# Patient Record
Sex: Male | Born: 1955 | Race: White | Hispanic: No | Marital: Married | Smoking: Former smoker
Health system: Southern US, Community
[De-identification: ages and names within clinical notes are randomized; demographics above are authoritative.]

## PROBLEM LIST (undated history)

## (undated) DIAGNOSIS — E7800 Pure hypercholesterolemia, unspecified: Secondary | ICD-10-CM

## (undated) DIAGNOSIS — E119 Type 2 diabetes mellitus without complications: Secondary | ICD-10-CM

## (undated) DIAGNOSIS — G4733 Obstructive sleep apnea (adult) (pediatric): Secondary | ICD-10-CM

## (undated) DIAGNOSIS — F419 Anxiety disorder, unspecified: Secondary | ICD-10-CM

## (undated) DIAGNOSIS — F32A Depression, unspecified: Secondary | ICD-10-CM

## (undated) DIAGNOSIS — G43909 Migraine, unspecified, not intractable, without status migrainosus: Secondary | ICD-10-CM

## (undated) DIAGNOSIS — T148XXA Other injury of unspecified body region, initial encounter: Secondary | ICD-10-CM

## (undated) DIAGNOSIS — I10 Essential (primary) hypertension: Secondary | ICD-10-CM

## (undated) DIAGNOSIS — F329 Major depressive disorder, single episode, unspecified: Secondary | ICD-10-CM

## (undated) HISTORY — DX: Depression, unspecified: F32.A

## (undated) HISTORY — DX: Anxiety disorder, unspecified: F41.9

## (undated) HISTORY — DX: Essential (primary) hypertension: I10

## (undated) HISTORY — DX: Obstructive sleep apnea (adult) (pediatric): G47.33

## (undated) HISTORY — DX: Pure hypercholesterolemia, unspecified: E78.00

## (undated) HISTORY — DX: Major depressive disorder, single episode, unspecified: F32.9

## (undated) HISTORY — PX: CHOLECYSTECTOMY: SHX55

## (undated) HISTORY — DX: Migraine, unspecified, not intractable, without status migrainosus: G43.909

## (undated) HISTORY — DX: Type 2 diabetes mellitus without complications: E11.9

## (undated) HISTORY — DX: Other injury of unspecified body region, initial encounter: T14.8XXA

---
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
## 1983-07-18 HISTORY — PX: BACK SURGERY: SHX140

## 1996-07-17 DIAGNOSIS — T148XXA Other injury of unspecified body region, initial encounter: Secondary | ICD-10-CM

## 1996-07-17 HISTORY — DX: Other injury of unspecified body region, initial encounter: T14.8XXA

## 1999-07-18 HISTORY — PX: OTHER SURGICAL HISTORY: SHX169

## 2008-02-27 ENCOUNTER — Encounter: Admission: RE | Admit: 2008-02-27 | Discharge: 2008-02-27 | Payer: Self-pay | Admitting: Endocrinology

## 2008-02-27 IMAGING — CT CT HEAD W/O CM
1 series · 16 of 28 positions shown, 20 images · non-contrast
Comparison: None

CLINICAL DATA: Assess for pituitary tumor

CT HEAD WITHOUT CONTRAST
TECHNIQUE: Contiguous axial images were obtained from the base of
the skull through the vertex without contrast.

[Series 2: head · axial · 0.49mm/px · z∈[-0,+127]mm · 16 of 28 slices shown, 20 images]
[im 2/28  brain]
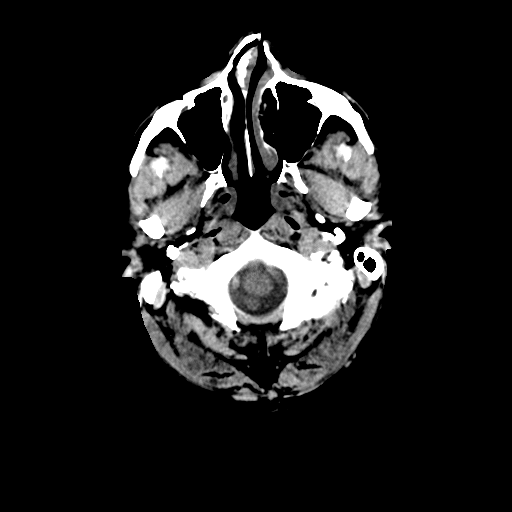
[im 2/28  bone]
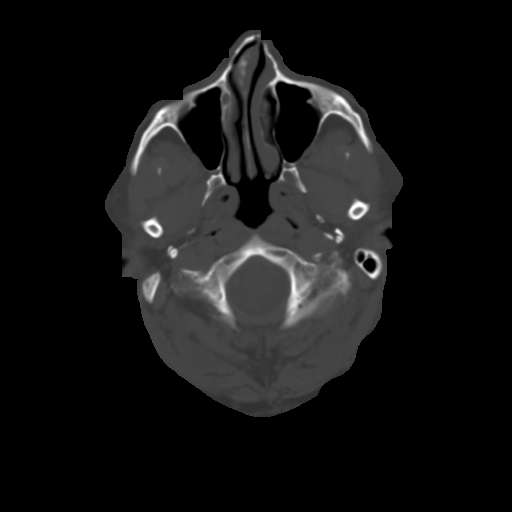
[im 4/28  brain]
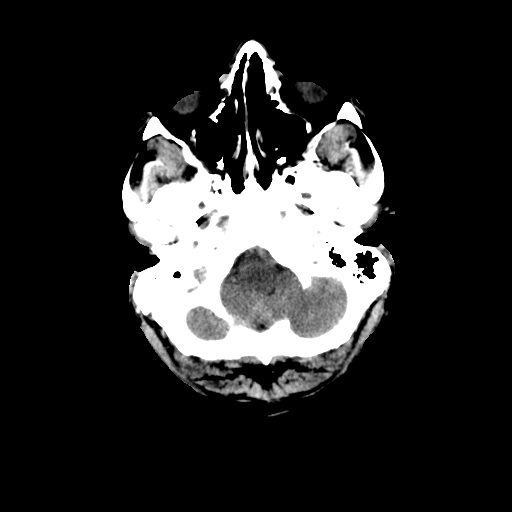
[im 6/28  brain]
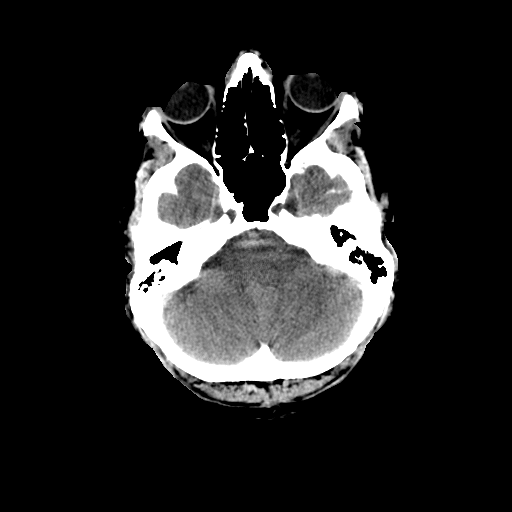
[im 7/28  brain]
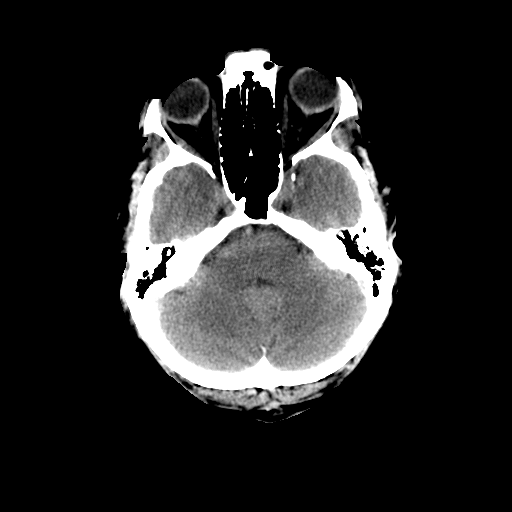
[im 9/28  brain]
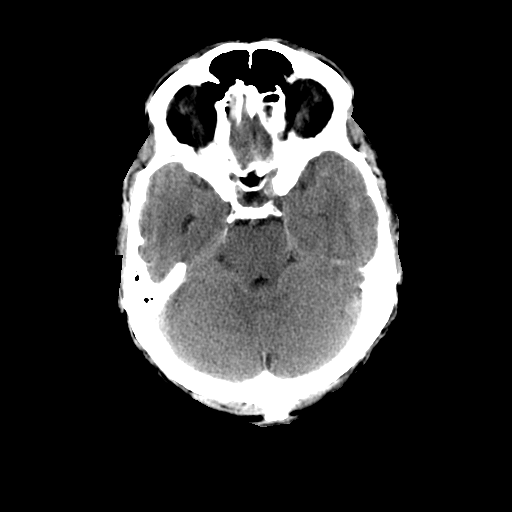
[im 9/28  bone]
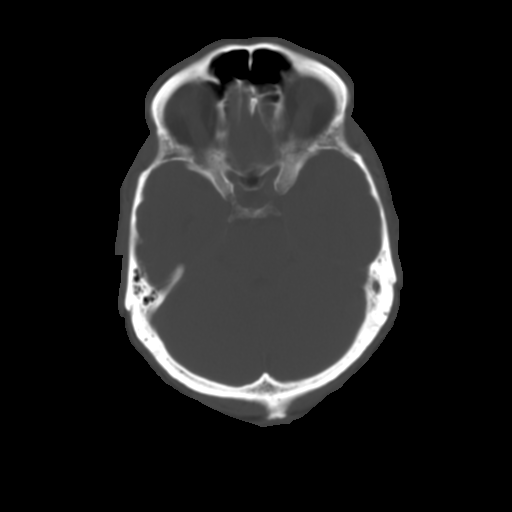
[im 10/28  brain]
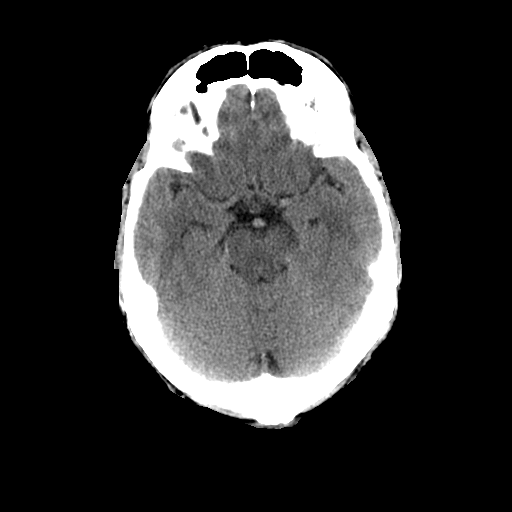
[im 12/28  brain]
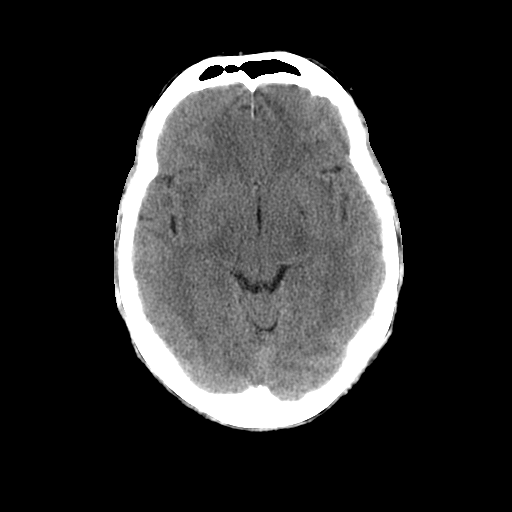
[im 14/28  brain]
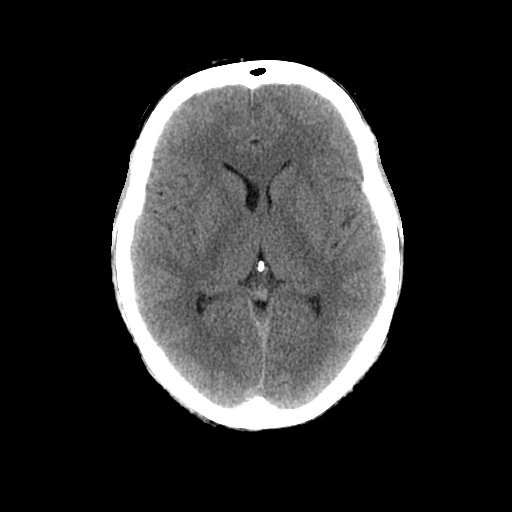
[im 15/28  brain]
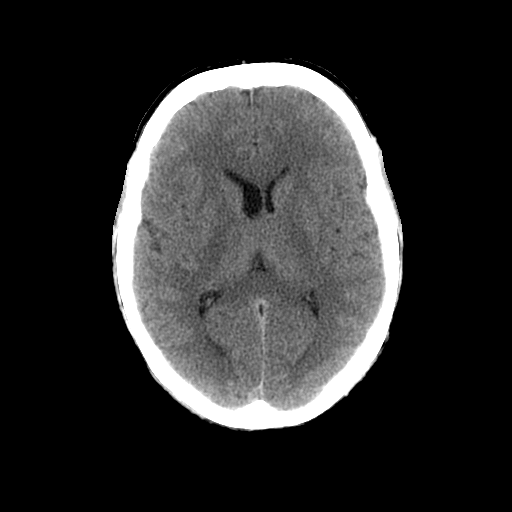
[im 15/28  bone]
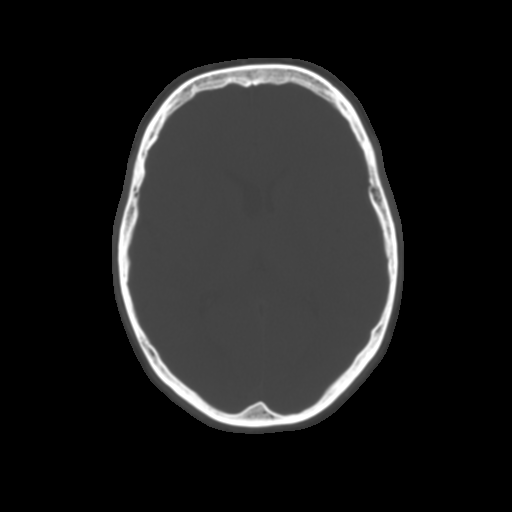
[im 17/28  brain]
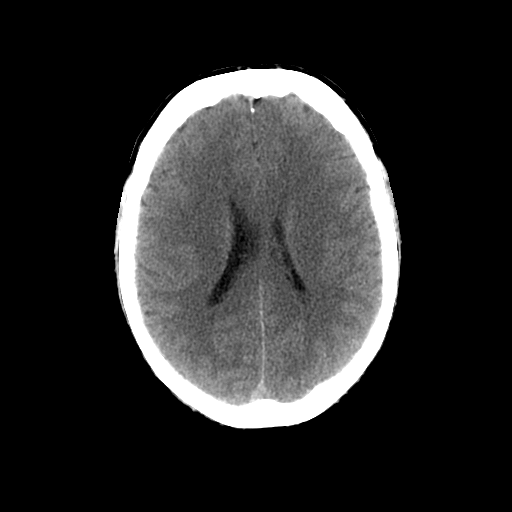
[im 19/28  brain]
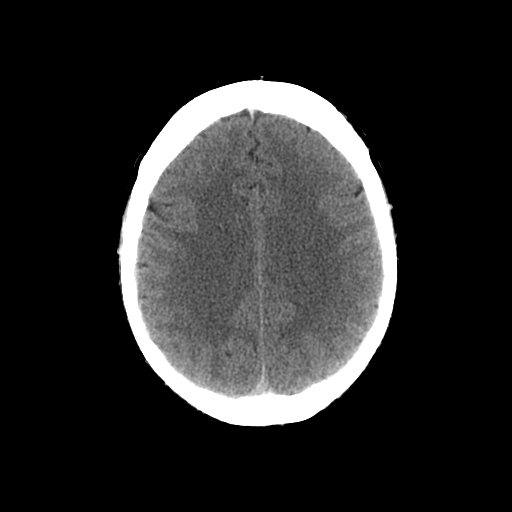
[im 20/28  brain]
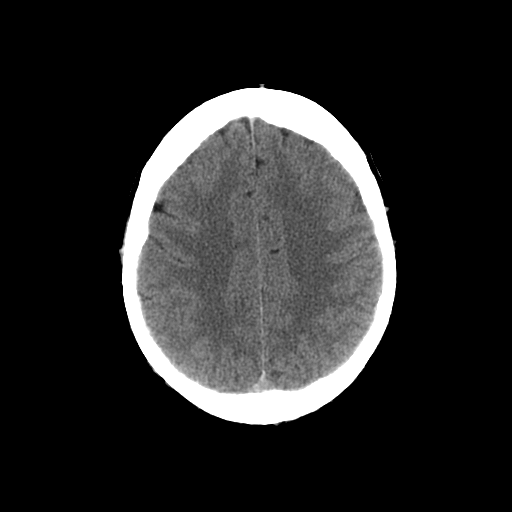
[im 22/28  brain]
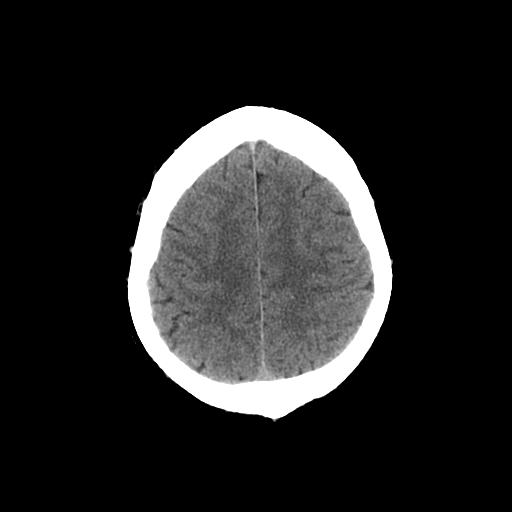
[im 22/28  bone]
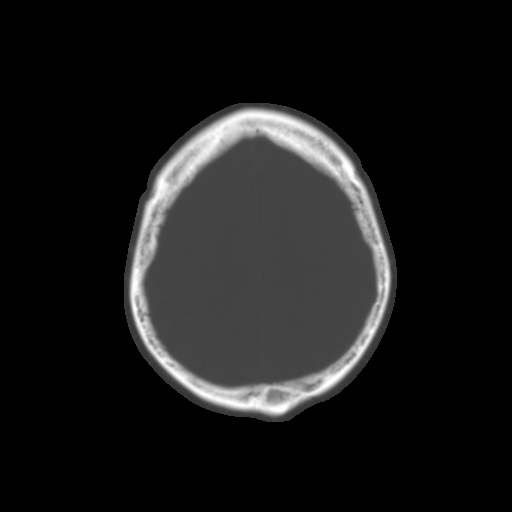
[im 23/28  brain]
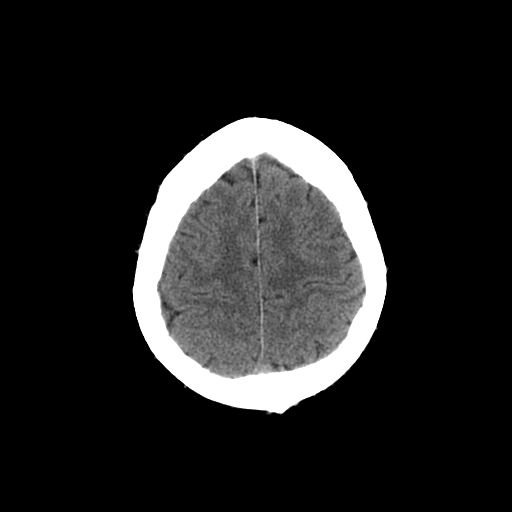
[im 25/28  brain]
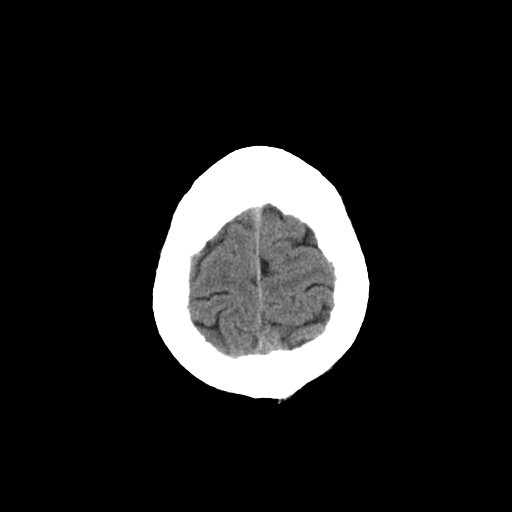
[im 27/28  brain]
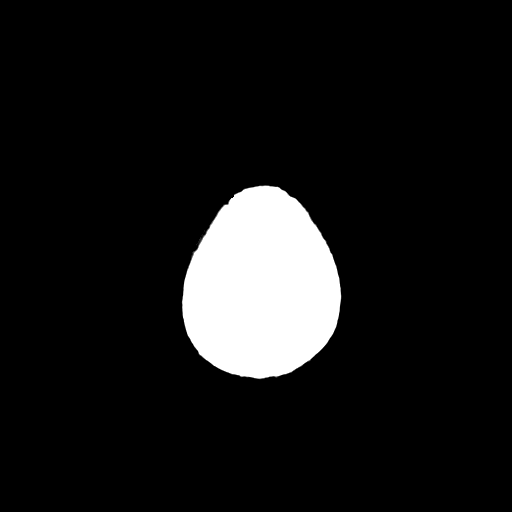

[16 of 28 positions shown; findings below may reference images not displayed]

FINDINGS: The brain has a normal appearance without evidence of
atrophy, infarction, mass lesion, hemorrhage, hydrocephalus or
extra-axial collection.  There is no pituitary mass lesion evident.
Obviously, microadenoma could not be excluded, but there is no
evidence of macroadenoma or anything in the suprasellar region.
Sinuses, middle ears and mastoids are clear.
IMPRESSION: Normal examination.  See above.

## 2009-07-17 HISTORY — PX: OTHER SURGICAL HISTORY: SHX169

## 2010-12-15 DIAGNOSIS — R072 Precordial pain: Secondary | ICD-10-CM

## 2011-04-03 DIAGNOSIS — M19019 Primary osteoarthritis, unspecified shoulder: Secondary | ICD-10-CM | POA: Insufficient documentation

## 2015-08-06 DIAGNOSIS — E119 Type 2 diabetes mellitus without complications: Secondary | ICD-10-CM | POA: Diagnosis not present

## 2015-12-20 DIAGNOSIS — R05 Cough: Secondary | ICD-10-CM | POA: Diagnosis not present

## 2015-12-20 DIAGNOSIS — E1165 Type 2 diabetes mellitus with hyperglycemia: Secondary | ICD-10-CM | POA: Diagnosis not present

## 2015-12-20 DIAGNOSIS — J3089 Other allergic rhinitis: Secondary | ICD-10-CM | POA: Diagnosis not present

## 2016-03-29 DIAGNOSIS — K210 Gastro-esophageal reflux disease with esophagitis: Secondary | ICD-10-CM | POA: Diagnosis not present

## 2016-03-29 DIAGNOSIS — E1165 Type 2 diabetes mellitus with hyperglycemia: Secondary | ICD-10-CM | POA: Diagnosis not present

## 2016-03-29 DIAGNOSIS — R5383 Other fatigue: Secondary | ICD-10-CM | POA: Diagnosis not present

## 2016-03-30 DIAGNOSIS — R5383 Other fatigue: Secondary | ICD-10-CM | POA: Diagnosis not present

## 2016-03-30 DIAGNOSIS — Z6824 Body mass index (BMI) 24.0-24.9, adult: Secondary | ICD-10-CM | POA: Diagnosis not present

## 2016-03-30 DIAGNOSIS — Z23 Encounter for immunization: Secondary | ICD-10-CM | POA: Diagnosis not present

## 2016-03-30 DIAGNOSIS — Z0001 Encounter for general adult medical examination with abnormal findings: Secondary | ICD-10-CM | POA: Diagnosis not present

## 2016-03-30 DIAGNOSIS — R69 Illness, unspecified: Secondary | ICD-10-CM | POA: Diagnosis not present

## 2016-03-30 DIAGNOSIS — E1165 Type 2 diabetes mellitus with hyperglycemia: Secondary | ICD-10-CM | POA: Diagnosis not present

## 2016-03-30 DIAGNOSIS — K219 Gastro-esophageal reflux disease without esophagitis: Secondary | ICD-10-CM | POA: Diagnosis not present

## 2016-09-15 DIAGNOSIS — K210 Gastro-esophageal reflux disease with esophagitis: Secondary | ICD-10-CM | POA: Diagnosis not present

## 2016-09-15 DIAGNOSIS — E1165 Type 2 diabetes mellitus with hyperglycemia: Secondary | ICD-10-CM | POA: Diagnosis not present

## 2016-09-15 DIAGNOSIS — E1140 Type 2 diabetes mellitus with diabetic neuropathy, unspecified: Secondary | ICD-10-CM | POA: Diagnosis not present

## 2016-09-15 DIAGNOSIS — R69 Illness, unspecified: Secondary | ICD-10-CM | POA: Diagnosis not present

## 2016-09-19 DIAGNOSIS — R69 Illness, unspecified: Secondary | ICD-10-CM | POA: Diagnosis not present

## 2016-09-19 DIAGNOSIS — E1165 Type 2 diabetes mellitus with hyperglycemia: Secondary | ICD-10-CM | POA: Diagnosis not present

## 2016-09-19 DIAGNOSIS — K210 Gastro-esophageal reflux disease with esophagitis: Secondary | ICD-10-CM | POA: Diagnosis not present

## 2016-09-19 DIAGNOSIS — E1140 Type 2 diabetes mellitus with diabetic neuropathy, unspecified: Secondary | ICD-10-CM | POA: Diagnosis not present

## 2016-09-28 DIAGNOSIS — Z6825 Body mass index (BMI) 25.0-25.9, adult: Secondary | ICD-10-CM | POA: Diagnosis not present

## 2016-09-28 DIAGNOSIS — L03114 Cellulitis of left upper limb: Secondary | ICD-10-CM | POA: Diagnosis not present

## 2016-10-06 DIAGNOSIS — Z6825 Body mass index (BMI) 25.0-25.9, adult: Secondary | ICD-10-CM | POA: Diagnosis not present

## 2016-10-06 DIAGNOSIS — L03114 Cellulitis of left upper limb: Secondary | ICD-10-CM | POA: Diagnosis not present

## 2017-04-30 DIAGNOSIS — E1165 Type 2 diabetes mellitus with hyperglycemia: Secondary | ICD-10-CM | POA: Diagnosis not present

## 2017-04-30 DIAGNOSIS — K210 Gastro-esophageal reflux disease with esophagitis: Secondary | ICD-10-CM | POA: Diagnosis not present

## 2017-04-30 DIAGNOSIS — E1140 Type 2 diabetes mellitus with diabetic neuropathy, unspecified: Secondary | ICD-10-CM | POA: Diagnosis not present

## 2017-04-30 DIAGNOSIS — R5383 Other fatigue: Secondary | ICD-10-CM | POA: Diagnosis not present

## 2017-05-02 DIAGNOSIS — R5383 Other fatigue: Secondary | ICD-10-CM | POA: Diagnosis not present

## 2017-05-02 DIAGNOSIS — M129 Arthropathy, unspecified: Secondary | ICD-10-CM | POA: Diagnosis not present

## 2017-05-02 DIAGNOSIS — E1140 Type 2 diabetes mellitus with diabetic neuropathy, unspecified: Secondary | ICD-10-CM | POA: Diagnosis not present

## 2017-05-02 DIAGNOSIS — Z6825 Body mass index (BMI) 25.0-25.9, adult: Secondary | ICD-10-CM | POA: Diagnosis not present

## 2017-05-02 DIAGNOSIS — E1165 Type 2 diabetes mellitus with hyperglycemia: Secondary | ICD-10-CM | POA: Diagnosis not present

## 2017-05-02 DIAGNOSIS — K219 Gastro-esophageal reflux disease without esophagitis: Secondary | ICD-10-CM | POA: Diagnosis not present

## 2017-05-02 DIAGNOSIS — Z0001 Encounter for general adult medical examination with abnormal findings: Secondary | ICD-10-CM | POA: Diagnosis not present

## 2017-05-02 DIAGNOSIS — Z23 Encounter for immunization: Secondary | ICD-10-CM | POA: Diagnosis not present

## 2017-05-02 DIAGNOSIS — R69 Illness, unspecified: Secondary | ICD-10-CM | POA: Diagnosis not present

## 2017-08-31 DIAGNOSIS — E1165 Type 2 diabetes mellitus with hyperglycemia: Secondary | ICD-10-CM | POA: Diagnosis not present

## 2017-08-31 DIAGNOSIS — E1140 Type 2 diabetes mellitus with diabetic neuropathy, unspecified: Secondary | ICD-10-CM | POA: Diagnosis not present

## 2017-08-31 DIAGNOSIS — R5383 Other fatigue: Secondary | ICD-10-CM | POA: Diagnosis not present

## 2017-08-31 DIAGNOSIS — K210 Gastro-esophageal reflux disease with esophagitis: Secondary | ICD-10-CM | POA: Diagnosis not present

## 2017-08-31 DIAGNOSIS — F411 Generalized anxiety disorder: Secondary | ICD-10-CM | POA: Diagnosis not present

## 2017-09-05 DIAGNOSIS — Z6825 Body mass index (BMI) 25.0-25.9, adult: Secondary | ICD-10-CM | POA: Diagnosis not present

## 2017-09-05 DIAGNOSIS — R5383 Other fatigue: Secondary | ICD-10-CM | POA: Diagnosis not present

## 2017-09-05 DIAGNOSIS — R5381 Other malaise: Secondary | ICD-10-CM | POA: Diagnosis not present

## 2017-10-04 DIAGNOSIS — Z6825 Body mass index (BMI) 25.0-25.9, adult: Secondary | ICD-10-CM | POA: Diagnosis not present

## 2017-10-04 DIAGNOSIS — S8002XA Contusion of left knee, initial encounter: Secondary | ICD-10-CM | POA: Diagnosis not present

## 2017-12-18 DIAGNOSIS — Z6824 Body mass index (BMI) 24.0-24.9, adult: Secondary | ICD-10-CM | POA: Diagnosis not present

## 2017-12-18 DIAGNOSIS — R300 Dysuria: Secondary | ICD-10-CM | POA: Diagnosis not present

## 2017-12-20 DIAGNOSIS — F324 Major depressive disorder, single episode, in partial remission: Secondary | ICD-10-CM | POA: Diagnosis not present

## 2017-12-27 DIAGNOSIS — F324 Major depressive disorder, single episode, in partial remission: Secondary | ICD-10-CM | POA: Diagnosis not present

## 2018-03-21 DIAGNOSIS — H52222 Regular astigmatism, left eye: Secondary | ICD-10-CM | POA: Diagnosis not present

## 2018-03-21 DIAGNOSIS — H5203 Hypermetropia, bilateral: Secondary | ICD-10-CM | POA: Diagnosis not present

## 2018-03-21 DIAGNOSIS — H25813 Combined forms of age-related cataract, bilateral: Secondary | ICD-10-CM | POA: Diagnosis not present

## 2018-03-21 DIAGNOSIS — E119 Type 2 diabetes mellitus without complications: Secondary | ICD-10-CM | POA: Diagnosis not present

## 2018-03-21 DIAGNOSIS — H524 Presbyopia: Secondary | ICD-10-CM | POA: Diagnosis not present

## 2018-04-01 DIAGNOSIS — J3489 Other specified disorders of nose and nasal sinuses: Secondary | ICD-10-CM | POA: Diagnosis not present

## 2018-04-01 DIAGNOSIS — R05 Cough: Secondary | ICD-10-CM | POA: Diagnosis not present

## 2018-04-30 DIAGNOSIS — F324 Major depressive disorder, single episode, in partial remission: Secondary | ICD-10-CM | POA: Diagnosis not present

## 2018-07-17 HISTORY — PX: OTHER SURGICAL HISTORY: SHX169

## 2018-08-21 DIAGNOSIS — Z6824 Body mass index (BMI) 24.0-24.9, adult: Secondary | ICD-10-CM | POA: Diagnosis not present

## 2018-08-21 DIAGNOSIS — R51 Headache: Secondary | ICD-10-CM | POA: Diagnosis not present

## 2018-09-25 DIAGNOSIS — E1140 Type 2 diabetes mellitus with diabetic neuropathy, unspecified: Secondary | ICD-10-CM | POA: Diagnosis not present

## 2018-09-25 DIAGNOSIS — R51 Headache: Secondary | ICD-10-CM | POA: Diagnosis not present

## 2018-09-25 DIAGNOSIS — E1165 Type 2 diabetes mellitus with hyperglycemia: Secondary | ICD-10-CM | POA: Diagnosis not present

## 2018-09-25 DIAGNOSIS — Z6825 Body mass index (BMI) 25.0-25.9, adult: Secondary | ICD-10-CM | POA: Diagnosis not present

## 2018-09-25 DIAGNOSIS — N400 Enlarged prostate without lower urinary tract symptoms: Secondary | ICD-10-CM | POA: Diagnosis not present

## 2018-11-05 DIAGNOSIS — Z0001 Encounter for general adult medical examination with abnormal findings: Secondary | ICD-10-CM | POA: Diagnosis not present

## 2018-11-05 DIAGNOSIS — E1165 Type 2 diabetes mellitus with hyperglycemia: Secondary | ICD-10-CM | POA: Diagnosis not present

## 2018-11-05 DIAGNOSIS — E1140 Type 2 diabetes mellitus with diabetic neuropathy, unspecified: Secondary | ICD-10-CM | POA: Diagnosis not present

## 2018-11-05 DIAGNOSIS — M542 Cervicalgia: Secondary | ICD-10-CM | POA: Diagnosis not present

## 2018-11-05 DIAGNOSIS — F5101 Primary insomnia: Secondary | ICD-10-CM | POA: Diagnosis not present

## 2018-11-05 DIAGNOSIS — R51 Headache: Secondary | ICD-10-CM | POA: Diagnosis not present

## 2018-11-05 DIAGNOSIS — N400 Enlarged prostate without lower urinary tract symptoms: Secondary | ICD-10-CM | POA: Diagnosis not present

## 2018-11-05 DIAGNOSIS — Z6824 Body mass index (BMI) 24.0-24.9, adult: Secondary | ICD-10-CM | POA: Diagnosis not present

## 2018-11-19 ENCOUNTER — Telehealth: Payer: Self-pay | Admitting: Neurology

## 2018-11-19 NOTE — Telephone Encounter (Signed)
Patient is a new patient so we will not be able to do a telephone visit for this appointment.

## 2018-11-19 NOTE — Telephone Encounter (Addendum)
I called the wife back and LVM asking for call back. I also tried the home # and was able to speak to the patient. He stated his wife has access to a laptop or computer with camera and gave verbal consent to do a video visit . He is aware that there will be some limitations to the physical exam. He provided consent to file the visit with insurance. He stated he would call his wife discuss and I advised that our office will close at 5 pm so if we are not able to get the video visit setup before then we will have to r/s to another time. Appt is pending for tomorrow AM @ 8:00. He verbalized understanding.   Update: spoke with wife. She stated daughter can get the laptop to them for the visit. Daughter's email is [email or web address]. His appt was r/s to 3;30 pm 5/6 to allow for time to setup the visit. She stated pt has had a h/a since 08-20-2018. His mother died of brain cancer in her mid 22s. Pt's wife aware that the link will take her straight to a secure website called [email or web address] where the patient will enter a virtual waiting room. She verbalized understanding and appreciation.

## 2018-11-19 NOTE — Telephone Encounter (Signed)
Pt called stating that her husband only has a flip phone and are not able to do a Virtual Visit. Pts wife states that if worse comes to worse they can call their daughter to see if they can use her laptop. Please advise.

## 2018-11-19 NOTE — Telephone Encounter (Signed)
Email with Dr. Trevor Mace [email or web address] link sent to [email or web address].

## 2018-11-20 ENCOUNTER — Telehealth: Payer: Self-pay | Admitting: Neurology

## 2018-11-20 ENCOUNTER — Ambulatory Visit: Payer: Self-pay | Admitting: Neurology

## 2018-11-20 ENCOUNTER — Ambulatory Visit: Payer: Medicare PPO | Admitting: Neurology

## 2018-11-20 ENCOUNTER — Encounter: Payer: Self-pay | Admitting: *Deleted

## 2018-11-20 NOTE — Telephone Encounter (Signed)
Called wife and left another message. Would like to know if they prefer to be seen in the office or sooner than next Thursday. Left 2 messages, asked her to call us back. Or we can just keep the video appointment next Thursday. thanks

## 2018-11-20 NOTE — Telephone Encounter (Signed)
Pt's wife Kyle [name] called me back. I expressed Dr. Trevor Mace apologies for r/s and asked if she would like to either keep the video visit or we can see him in the office. We have an opening tomorrow morning. She needed to schedule around work because she will bringing pt. She will keep his same appt scheduled for 5/14 @ 11:15 and it will be an in-office visit. I advised her of the process of arrival (drive-up for check-in, wait in the car, temperature taken, when RN ready will bring inside). Advised no visitors with pt in office unless necessary. Pt's wife denied pt having any fever, sob, cough, no known exposure to anyone covid19 positive or being tested for it. She is aware pt must wear a mask. Pt's wife verbalized appreciation.

## 2018-11-20 NOTE — Telephone Encounter (Signed)
I called patient to see if he could do an earlier appointment today and he said he wanted his wife to be there so he could not. I offered to reschedule sooner. He was lovely, he was fine. He gave me the number for his wife to call and schedule, I left a message for his wife.  I will call patient's wife later this afternoon again and see if we can get them in for video tomorrow of Friday or if warranted we can offer an in office appointment. Will discuss with wife.

## 2018-11-28 ENCOUNTER — Other Ambulatory Visit: Payer: Self-pay

## 2018-11-28 ENCOUNTER — Encounter: Payer: Self-pay | Admitting: Neurology

## 2018-11-28 ENCOUNTER — Encounter: Payer: Self-pay | Admitting: *Deleted

## 2018-11-28 ENCOUNTER — Ambulatory Visit (INDEPENDENT_AMBULATORY_CARE_PROVIDER_SITE_OTHER): Payer: Medicare PPO | Admitting: Neurology

## 2018-11-28 VITALS — BP 156/86 | HR 62 | Temp 97.5°F | Ht 74.0 in | Wt 189.0 lb

## 2018-11-28 DIAGNOSIS — G4453 Primary thunderclap headache: Secondary | ICD-10-CM

## 2018-11-28 DIAGNOSIS — H5713 Ocular pain, bilateral: Secondary | ICD-10-CM | POA: Diagnosis not present

## 2018-11-28 DIAGNOSIS — R51 Headache with orthostatic component, not elsewhere classified: Secondary | ICD-10-CM

## 2018-11-28 DIAGNOSIS — R519 Headache, unspecified: Secondary | ICD-10-CM

## 2018-11-28 DIAGNOSIS — G441 Vascular headache, not elsewhere classified: Secondary | ICD-10-CM

## 2018-11-28 DIAGNOSIS — G4452 New daily persistent headache (NDPH): Secondary | ICD-10-CM | POA: Diagnosis not present

## 2018-11-28 MED ORDER — GABAPENTIN 300 MG PO CAPS: 600.0000 mg | ORAL_CAPSULE | Freq: Three times a day (TID) | ORAL | 11 refills | Status: DC

## 2018-11-28 NOTE — Progress Notes (Signed)
Kyle [name]    Provider:  Dr Lucia Gaskins Requesting Provider: Estanislado Pandy, MD Primary Care Provider:  Estanislado Pandy, MD  CC:  New daily persistent headache  HPI:  Kyle [name] is a 63 y.o. male here as requested by Estanislado Pandy, MD for headache. He has a PMHx of chronic pain, s/p spinal stimulator, anxiety, remote migraines, neck injury and chronic right arm weakness, HTN, HLD, DM, depression. Headaches since December acute onset, severe worst headache of his life. He has ESI injections but since the radioablation he has had a headache but did not coincide with the injection. Kyle KitchenHeadaches started a month later. Headaches in January. No known inciting events. He woke up one morning and felt like somoene hit him with a hammer right in the middle behind the eyes. It is tender to touch. Continuous, never stops, it wakes him up at night, he wakes with it, worse with position and valsalva. No snoring, he has a spinal stimulator. He woke up with it and it never went away. He is in a lot of pain. Constant, continuous 8/10 pain. Very shrill sounds make it worse. Nothing makes it better. Not positional. It throbs. No light or sound sensitivity (except for above), no nausea, no vomiting, sensitive to the touch at the bridge of the nose and radiates to the eyebrows into the temples. He feels more dizzy lately. He feels unbalanced even when sitting. No congestion no fever. Symmetric to the temples. Many years ago he had migraines, chocolate and a medication maybe triggered it and it was 1990 and nothing like this. Did not help him at all. No other focal neurologic deficits, associated symptoms, inciting events or modifiable factors.   Review of Systems: Patient complains of symptoms per HPI as well as the following symptoms: headache. Pertinent negatives and positives per HPI. All others negative.   Social History   Socioeconomic History  . Marital status: Married    Spouse name:  Drayson Latimer  . Number of children: 2  . Years of education: 31  . Highest education level: Not on file  Occupational History  . Occupation: disabled  Social Needs  . Financial resource strain: Not on file  . Food insecurity:    Worry: Not on file    Inability: Not on file  . Transportation needs:    Medical: Not on file    Non-medical: Not on file  Tobacco Use  . Smoking status: Former Smoker    Last attempt to quit: 1987    Years since quitting: 33.3  . Smokeless tobacco: Never Used  . Tobacco comment: 3-4 per day  Substance and Sexual Activity  . Alcohol use: Yes    Comment: occasionally  . Drug use: Never  . Sexual activity: Not on file  Lifestyle  . Physical activity:    Days per week: Not on file    Minutes per session: Not on file  . Stress: Not on file  Relationships  . Social connections:    Talks on phone: Not on file    Gets together: Not on file    Attends religious service: Not on file    Active member of club or organization: Not on file    Attends meetings of clubs or organizations: Not on file    Relationship status: Not on file  . Intimate partner violence:    Fear of current or ex partner: Not on file    Emotionally abused: Not on file  Physically abused: Not on file    Forced sexual activity: Not on file  Other Topics Concern  . Not on file  Social History Narrative   Lives at home with his wife   Right handed    Family History  Problem Relation Age of Onset  . Brain cancer Mother   . Heart disease Father   . Heart attack Father   . Diabetes Maternal Grandfather   . Cancer Paternal Grandmother     Past Medical History:  Diagnosis Date  . Anxiety   . Depression   . Diabetes (HCC)   . High cholesterol   . Hypertension   . Migraine   . 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 [facility]    Patient  Active Problem List   Diagnosis Date Noted  . NDPH (new daily persistent headache) 11/28/2018    Past Surgical History:  Procedure Laterality Date  . BACK SURGERY  1985  . CHOLECYSTECTOMY     2010?  Kyle [name]. FEMUR SURGERY  1972  . spinal stimulator implanted  2001  . spinal stimulator replaced  2011    Current Outpatient Medications  Medication Sig Dispense Refill  . buPROPion (WELLBUTRIN XL) 150 MG 24 hr tablet Take 150 mg by mouth 3 (three) times daily.    . DULoxetine (CYMBALTA) 20 MG capsule Take 20 mg by mouth daily.    Kyle [name]. gabapentin (NEURONTIN) 300 MG capsule Take 2 capsules (600 mg total) by mouth 3 (three) times daily. 180 capsule 11  . glipiZIDE (GLUCOTROL XL) 10 MG 24 hr tablet Take 10 mg by mouth 2 (two) times daily.    Kyle [name]. oxyCODONE-acetaminophen (PERCOCET/ROXICET) 5-325 MG tablet Take by mouth 3 (three) times daily.    Kyle [name]. tiZANidine (ZANAFLEX) 2 MG tablet Take 2 mg by mouth 2 (two) times a day.    . trazodone (DESYREL) 300 MG tablet Take 300 mg by mouth at bedtime.    Kyle [name]. zolpidem (AMBIEN CR) 12.5 MG CR tablet Take 12.5 mg by mouth at bedtime.     No current facility-administered medications for this visit.     Allergies as of 11/28/2018 - Review Complete 11/28/2018  Allergen Reaction Noted  . Aspirin  11/20/2018    Vitals: BP (!) 156/86 (BP Location: Right Arm, Patient Position: Sitting)   Pulse 62   Temp (!) 97.5 F (36.4 C) Comment: wife's is 98.2  Ht 6\' 2"  (1.88 m)   Wt 189 lb (85.7 kg)   BMI 24.27 kg/m  Last Weight:  Wt Readings from Last 1 Encounters:  11/28/18 189 lb (85.7 kg)   Last Height:   Ht Readings from Last 1 Encounters:  11/28/18 6\' 2"  (1.88 m)     Physical exam: Exam: Gen: NAD, conversant, well nourised, obese, well groomed                     CV: RRR, no MRG. No Carotid Bruits. No peripheral edema, warm, nontender Eyes: Conjunctivae clear without exudates or hemorrhage. Sweeling above the eyes, tenderness to palpation along the supraorbital  ridge.  Neuro: Detailed Neurologic Exam  Speech:    Speech is normal; fluent and spontaneous with normal comprehension.  Cognition:    The patient is oriented to person, place, and time;     recent and remote memory intact;     language fluent;  normal attention, concentration,     fund of knowledge Cranial Nerves:    The pupils are equal, round, and reactive to light. The fundi are normal and spontaneous venous pulsations are present. Visual fields are full to finger confrontation. Extraocular movements are intact. Trigeminal sensation is intact and the muscles of mastication are normal. The face is symmetric. The palate elevates in the midline. Hearing intact. Voice is normal. Shoulder shrug is normal. The tongue has normal motion without fasciculations.   Coordination:    Normal finger to nose  Gait:    Good stride and arm swing  Motor Observation:    No asymmetry, no atrophy, and no involuntary movements noted. Tone:    Normal muscle tone.    Posture:    Posture is normal. normal erect    Strength: prox weakness right arm (chronic) otherwise strength is V/V in the upper and lower limbs.      Sensation: intact to LT     Reflex Exam:  DTR's:    Deep tendon reflexes in the upper and lower extremities are hyporeflexic but symmetric bilaterally.   Toes:    The toes are downgoing bilaterally.   Clonus:    Clonus is absent.    Assessment/Plan:  63 y.o. male here as requested by Estanislado Pandy, MD for headache. He has a PMHx of chronic pain, s/p spinal stimulator, anxiety, remote migraines, neck injury and chronic right arm weakness, HTN, HLD, DM, depression. Headaches since December acute onset, frontal, continuous, intractable.    MRI brain due to concerning symptoms of morning headaches, positional headaches, new persistent daily headache with forehead/ orbital swelling to look for space occupying mass, infection (associated with a spinal procedure) such as  cerebritis, orbital infection, intracranial hemorrhage.   Increase Gabapentin.   Will call with next steps after MRI completed.  Orders Placed This Encounter  Procedures  . MR BRAIN W WO CONTRAST  . Comprehensive metabolic panel  . CBC  . TSH  . Sedimentation rate  . C-reactive protein   Meds ordered this encounter  Medications  . gabapentin (NEURONTIN) 300 MG capsule    Sig: Take 2 capsules (600 mg total) by mouth 3 (three) times daily.    Dispense:  180 capsule    Refill:  11    Cc: Sasser, Clarene Critchley, MD,  Sasser, Clarene Critchley, MD  Naomie Dean, MD  [name] [address] [place], [place] [address]  Phone [phone] Fax [phone] .

## 2018-11-29 LAB — COMPREHENSIVE METABOLIC PANEL
ALT: 42 IU/L (ref 0–44)
AST: 27 IU/L (ref 0–40)
Albumin/Globulin Ratio: 1.8 (ref 1.2–2.2)
Albumin: 4.6 g/dL (ref 3.8–4.8)
Alkaline Phosphatase: 91 IU/L (ref 39–117)
BUN/Creatinine Ratio: 11 (ref 10–24)
BUN: 11 mg/dL (ref 8–27)
Bilirubin Total: 0.3 mg/dL (ref 0.0–1.2)
CO2: 26 mmol/L (ref 20–29)
Calcium: 10 mg/dL (ref 8.6–10.2)
Chloride: 96 mmol/L (ref 96–106)
Creatinine, Ser: 0.96 mg/dL (ref 0.76–1.27)
GFR calc Af Amer: 96 mL/min/{1.73_m2} (ref >59)
GFR calc non Af Amer: 83 mL/min/{1.73_m2} (ref >59)
Globulin, Total: 2.6 g/dL (ref 1.5–4.5)
Glucose: 252 mg/dL — ABNORMAL HIGH (ref 65–99)
Potassium: 5.1 mmol/L (ref 3.5–5.2)
Sodium: 136 mmol/L (ref 134–144)
Total Protein: 7.2 g/dL (ref 6.0–8.5)

## 2018-11-29 LAB — CBC
Hematocrit: 46.5 % (ref 37.5–51.0)
Hemoglobin: 15.9 g/dL (ref 13.0–17.7)
MCH: 31 pg (ref 26.6–33.0)
MCHC: 34.2 g/dL (ref 31.5–35.7)
MCV: 91 fL (ref 79–97)
Platelets: 344 10*3/uL (ref 150–450)
RBC: 5.13 x10E6/uL (ref 4.14–5.80)
RDW: 12.5 % (ref 11.6–15.4)
WBC: 8.7 10*3/uL (ref 3.4–10.8)

## 2018-11-29 LAB — SEDIMENTATION RATE: Sed Rate: 2 mm/hr (ref 0–30)

## 2018-11-29 LAB — TSH: TSH: 2.2 u[IU]/mL (ref 0.450–4.500)

## 2018-11-29 LAB — C-REACTIVE PROTEIN: CRP: <1 mg/L (ref 0–10)

## 2018-12-02 ENCOUNTER — Telehealth: Payer: Self-pay | Admitting: *Deleted

## 2018-12-02 ENCOUNTER — Telehealth: Payer: Self-pay | Admitting: Neurology

## 2018-12-02 NOTE — Telephone Encounter (Signed)
Francine Graven Berkley Harvey: [id number] (exp. 12/02/18 to 03/02/19) patient has a pacemaker faxed the info to [facility]   Pacemaker info: Name: Spinal stimulator  Implanted: 04/21/2010 Model: [id number] Med[employer]onic Serial number: [id number]

## 2018-12-02 NOTE — Telephone Encounter (Signed)
-----   Message from Anson Fret, MD sent at 12/01/2018  4:53 PM EDT ----- Glucose is significantly elevated at 252. He has Diabetes, make sure he is managing his glucose and taking his meds. If he is not, elevated glucose can cause headaches and many other problems

## 2018-12-02 NOTE — Telephone Encounter (Signed)
Spoke with pt and discussed lab results (glucose 252) from Dr. Lucia Gaskins and advised pt of importance of managing his glucose and taking his medications. Pt stated he had a telehealth appt last week with Dr. Neita Carp and is waiting for new orders for medications and to receive a new glucose meter. I encouraged pt to call his office and check on this. Pt aware high glucose can cause headaches as well as other problems including diabetic ketoacidosis/crisis and possible symptoms. Pt advised to proceed to an ED for prompt treatment if this were to occur. Pt inquired about MRI update and was made aware scan was authorized by insurance however there is a pending determination whether or not his spinal stimulator is compatible. He verbalized understanding and appreciation.

## 2018-12-05 ENCOUNTER — Telehealth: Payer: Self-pay | Admitting: *Deleted

## 2018-12-05 NOTE — Telephone Encounter (Signed)
We received a fax from pt's wife asking for anxiety medication (d/t claustrophobia) for pt's MRI scheduled for 5/27. I called the patient d/t not having a DPR on file yet. He did confirm that it is ok to move forward with this request and that he gives verbal consent for our office to talk to pt's wife Adriano Kaba) about his medical care, appointments, etc.

## 2018-12-05 NOTE — Telephone Encounter (Signed)
Patient is scheduled at [facility] for 12/11/18.

## 2018-12-07 ENCOUNTER — Other Ambulatory Visit (HOSPITAL_COMMUNITY): Payer: Medicare Other

## 2018-12-07 ENCOUNTER — Encounter (HOSPITAL_COMMUNITY): Payer: Self-pay

## 2018-12-07 ENCOUNTER — Other Ambulatory Visit: Payer: Self-pay | Admitting: Neurology

## 2018-12-07 MED ORDER — ALPRAZOLAM 0.25 MG PO TABS: ORAL_TABLET | ORAL | 0 refills | Status: DC

## 2018-12-07 NOTE — Progress Notes (Signed)
xa

## 2018-12-11 ENCOUNTER — Ambulatory Visit (HOSPITAL_COMMUNITY)
Admission: RE | Admit: 2018-12-11 | Discharge: 2018-12-11 | Disposition: A | Discharge status: Home / Self Care | Payer: Medicare PPO | Source: Ambulatory Visit | Attending: Neurology | Admitting: Neurology

## 2018-12-11 ENCOUNTER — Other Ambulatory Visit: Payer: Self-pay

## 2018-12-11 DIAGNOSIS — R51 Headache with orthostatic component, not elsewhere classified: Secondary | ICD-10-CM

## 2018-12-11 DIAGNOSIS — G4452 New daily persistent headache (NDPH): Secondary | ICD-10-CM | POA: Diagnosis not present

## 2018-12-11 DIAGNOSIS — G441 Vascular headache, not elsewhere classified: Secondary | ICD-10-CM | POA: Diagnosis not present

## 2018-12-11 DIAGNOSIS — G4453 Primary thunderclap headache: Secondary | ICD-10-CM | POA: Diagnosis not present

## 2018-12-11 DIAGNOSIS — R519 Headache, unspecified: Secondary | ICD-10-CM

## 2018-12-11 DIAGNOSIS — H5713 Ocular pain, bilateral: Secondary | ICD-10-CM | POA: Diagnosis not present

## 2018-12-11 IMAGING — MR MRI HEAD WITHOUT AND WITH CONTRAST
9 of 12 series · 34 of 48 positions shown · IV contrast (agent unspecified)
Comparison: CT head [DATE]

CLINICAL DATA: Headaches

EXAM:
MRI HEAD WITHOUT AND WITH CONTRAST
TECHNIQUE: Multiplanar, multiecho pulse sequences of the brain and surrounding
structures were obtained without and with intravenous contrast.
CONTRAST:  7 mL Gadovist IV
The patient was nauseated after Gadovist injection but the exam was
continued without further complication.

[Series 2: T1 · sagittal · 5.0mm · 0.47mm/px · 1 of 19 slices shown]
[im 1/19]
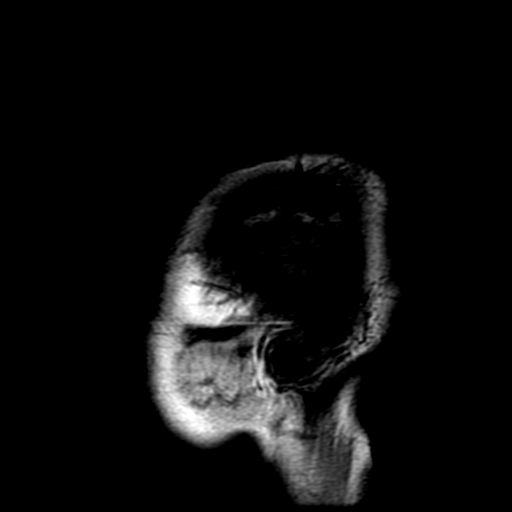

[Series 2: DWI · axial · 5.0mm · 1.25mm/px · z∈[-74,+104]mm · 7 of 68 slices shown (1 of 4)]
[im 1/68]
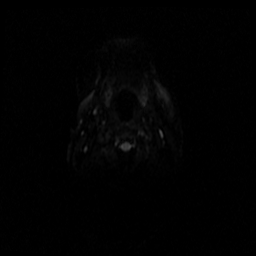
[im 12/68]
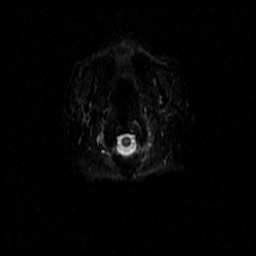
[im 23/68]
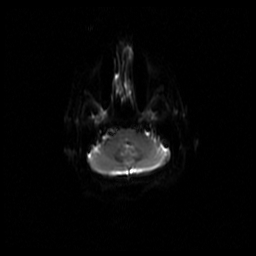
[im 34/68]
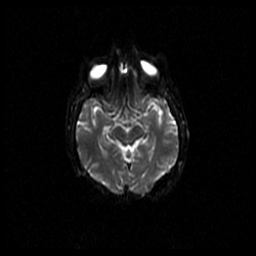
[im 45/68]
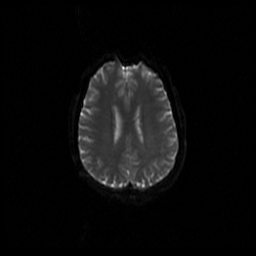
[im 56/68]
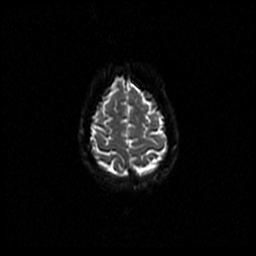
[im 68/68]
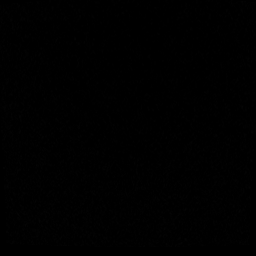

[Series 3: FLAIR · axial · 5.0mm · 0.43mm/px · z∈[-46,+75]mm · 3 of 23 slices shown]
[im 1/23]
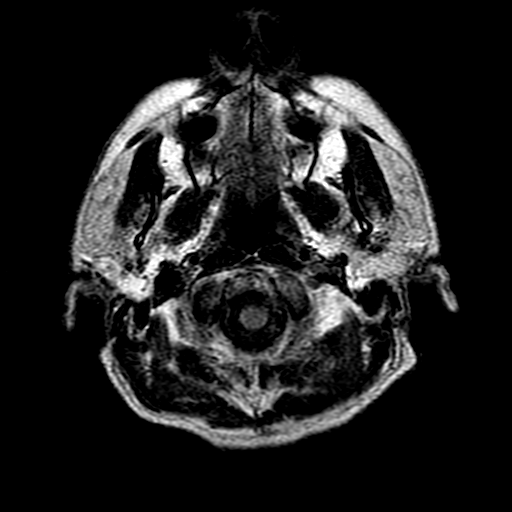
[im 12/23]
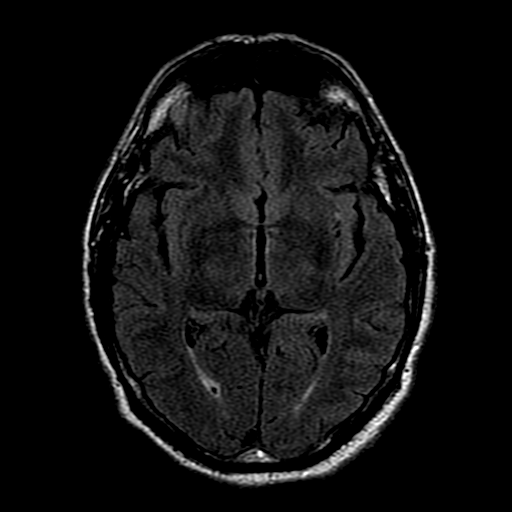
[im 23/23]
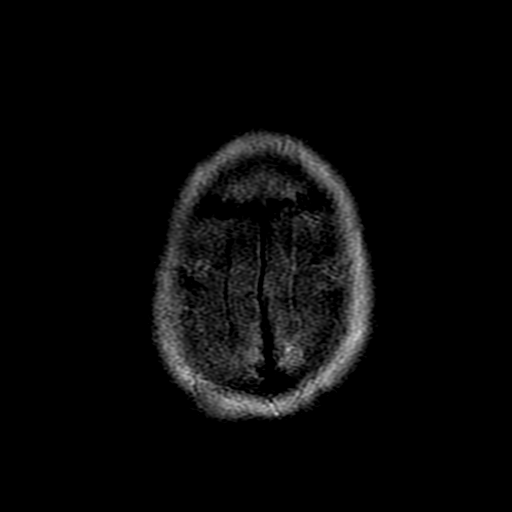

[Series 4: T2 · axial · 5.0mm · 0.43mm/px · z∈[-46,+75]mm · 3 of 23 slices shown (1 of 2)]
[im 1/23]
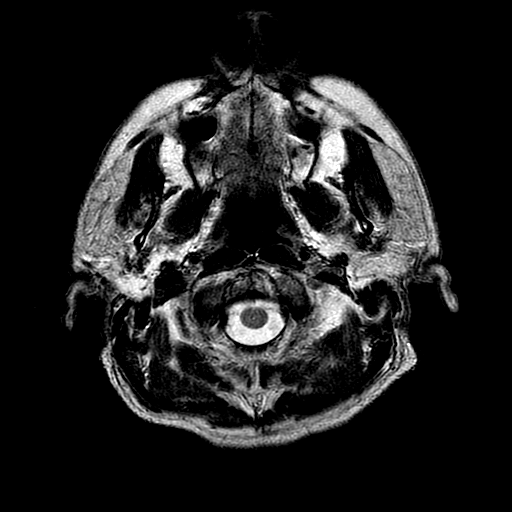
[im 12/23]
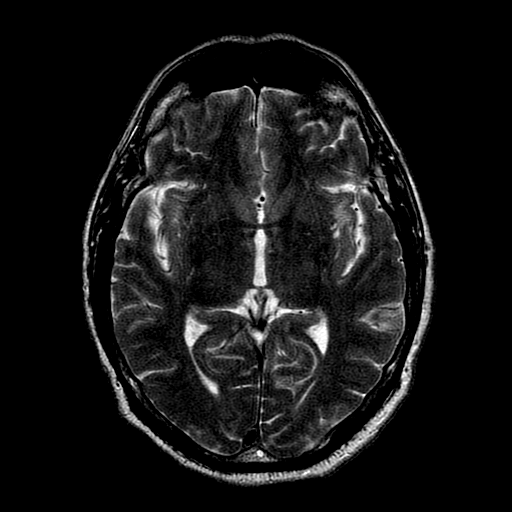
[im 23/23]
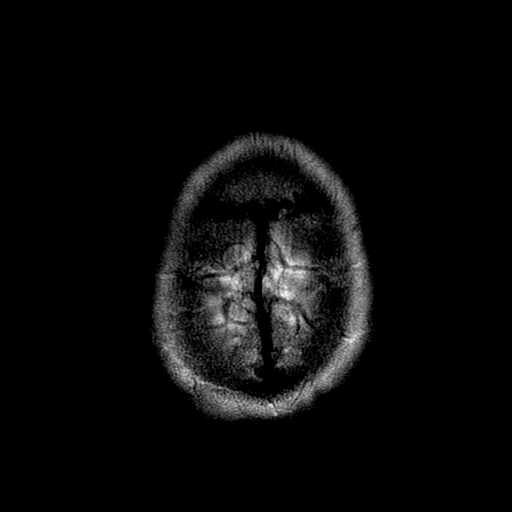

[Series 7: DWI · coronal · 5.0mm · 1.25mm/px · 7 of 62 slices shown (2 of 4)]
[im 1/62]
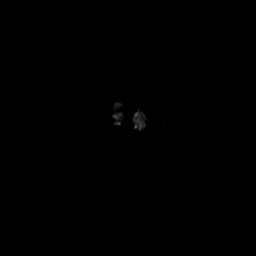
[im 11/62]
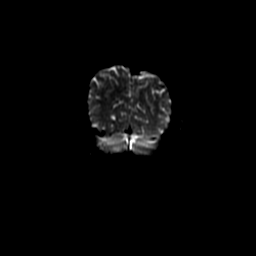
[im 21/62]
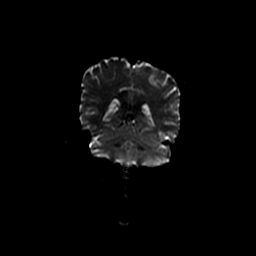
[im 31/62]
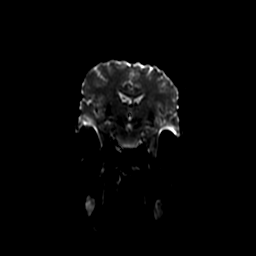
[im 41/62]
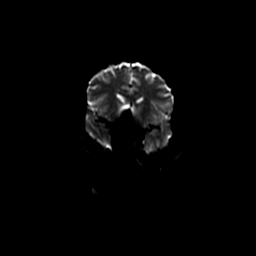
[im 51/62]
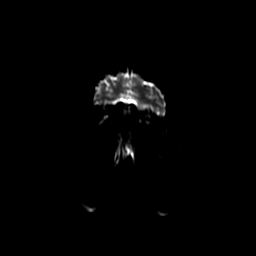
[im 62/62]
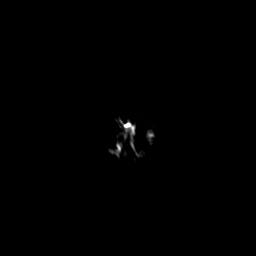

[Series 9: T1 post-contrast · coronal · 5.0mm · 0.43mm/px · 3 of 27 slices shown]
[im 1/27]
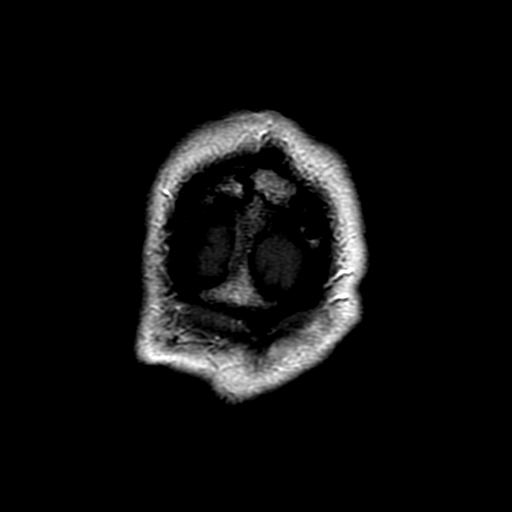
[im 14/27]
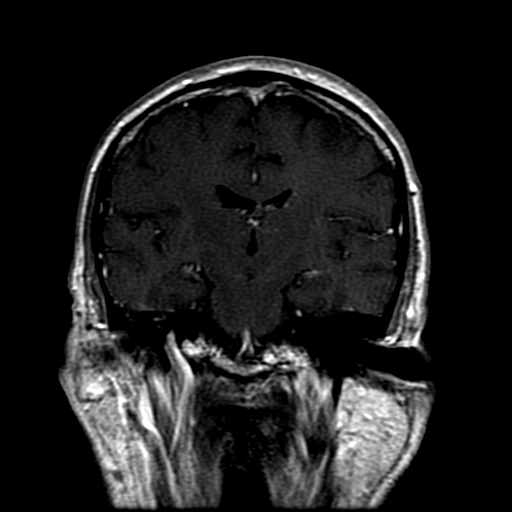
[im 27/27]
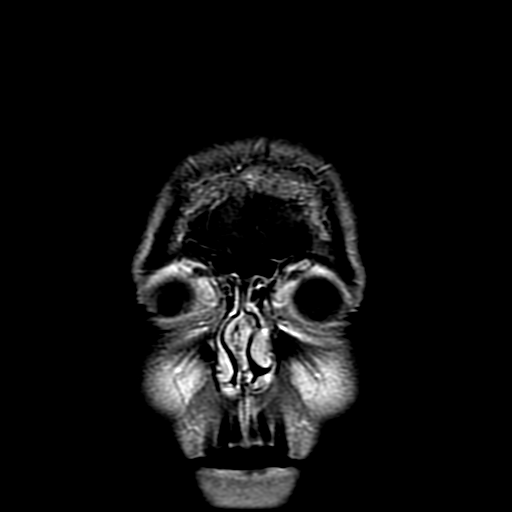

[Series 11: T2 · coronal · 5.0mm · 0.43mm/px · 3 of 27 slices shown (2 of 2)]
[im 1/27]
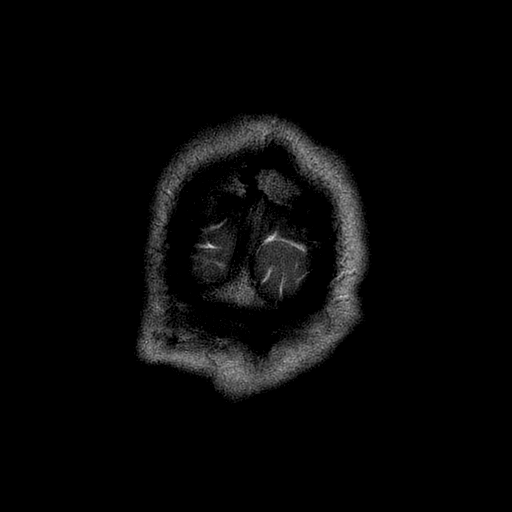
[im 14/27]
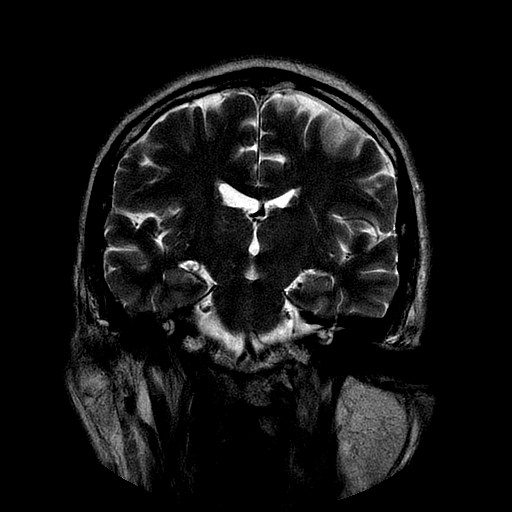
[im 27/27]
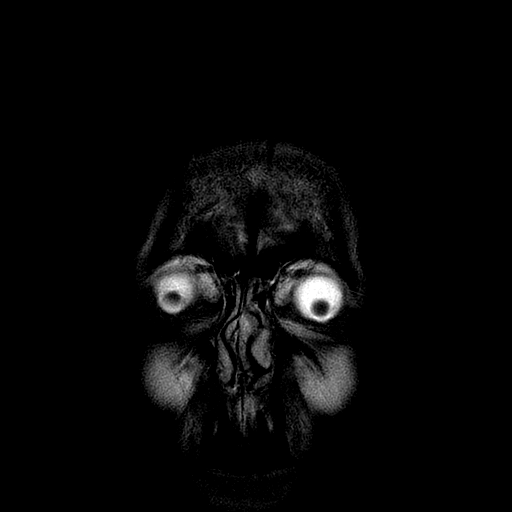

[Series 201: DWI · axial · 5.0mm · 1.25mm/px · z∈[-74,+104]mm · 4 of 35 slices shown (3 of 4)]
[im 1/35]
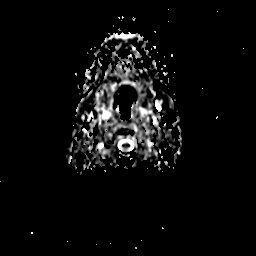
[im 12/35]
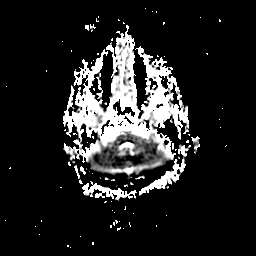
[im 23/35]
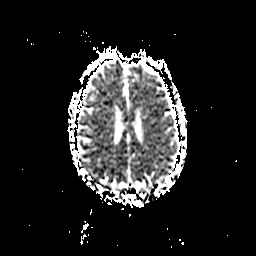
[im 35/35]
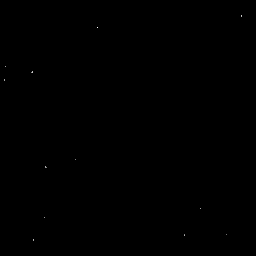

[Series 701: DWI · coronal · 5.0mm · 1.25mm/px · 3 of 31 slices shown (4 of 4)]
[im 1/31]
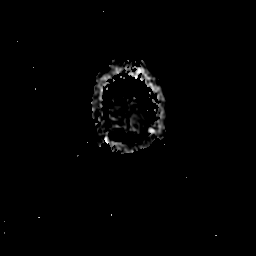
[im 16/31]
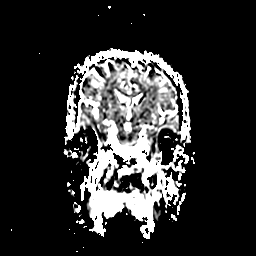
[im 31/31]
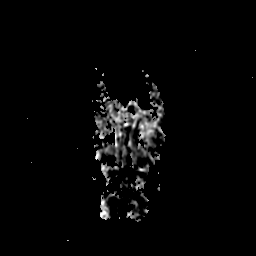

[34 of 48 positions shown; findings below may reference images not displayed]

FINDINGS: Brain: Patient has a spinal stimulator. Spinal stimulator protocol
was utilized.

Ventricle size and cerebral volume normal. Negative for infarct,
hemorrhage, mass. Normal white matter. Normal enhancement
postcontrast administration. Diffusion-weighted imaging was
performed however was not properly processed.

Vascular: Normal arterial flow voids

Skull and upper cervical spine: Negative

Sinuses/Orbits: Negative

Other: None
IMPRESSION: Normal MRI of the brain with contrast.

## 2018-12-11 MED ORDER — GADOBUTROL 1 MMOL/ML IV SOLN
7.0000 mL | Freq: Once | INTRAVENOUS | Status: AC | PRN
  Administered 2018-12-11: 16:00:00 7 mL via INTRAVENOUS

## 2018-12-12 ENCOUNTER — Telehealth: Payer: Self-pay | Admitting: Neurology

## 2018-12-12 NOTE — Telephone Encounter (Signed)
MRI of the brain is normal.   MRI brain 12/11/18:  IMPRESSION: Normal MRI of the brain with contrast.

## 2018-12-26 ENCOUNTER — Telehealth: Payer: Self-pay | Admitting: *Deleted

## 2018-12-26 NOTE — Telephone Encounter (Signed)
I called the pt's wife Kyle [name] and LVM asking for call back to discuss doing the 6/17 visit by video or telephone instead of coming into the office. I left the office number in the message.   When she calls back, please obtain consent for video visit or telephone visit if unable to do VV.

## 2018-12-26 NOTE — Telephone Encounter (Signed)
pts wife returned call and stated she thinks it will better for him to come in office , she wants to know what other options is best

## 2018-12-30 NOTE — Telephone Encounter (Signed)
I called the pt's wife Kyle [name] @ [phone] and LVM advising she and pt can come into office for appt Thurs 6/17 @ 2:00 pm arrival 15-30 minutes prior. I advised mask required, reviewed covid19 screening questions  (symptoms, travel, exposure) and advised if those apply to them to please call our office back. Advised of drive-up check-in. Left office number for cb if any questions.

## 2018-12-30 NOTE — Telephone Encounter (Signed)
Pt wife Ivin Booty  is calling in for a update on appt scheduled  Cb# [phone]

## 2018-12-30 NOTE — Telephone Encounter (Signed)
That's fine. thanks

## 2019-01-01 ENCOUNTER — Encounter: Payer: Self-pay | Admitting: Neurology

## 2019-01-01 ENCOUNTER — Other Ambulatory Visit: Payer: Self-pay

## 2019-01-01 ENCOUNTER — Other Ambulatory Visit: Payer: Self-pay | Admitting: Neurology

## 2019-01-01 ENCOUNTER — Ambulatory Visit (INDEPENDENT_AMBULATORY_CARE_PROVIDER_SITE_OTHER): Payer: Medicare PPO | Admitting: Neurology

## 2019-01-01 VITALS — BP 147/83 | Temp 98.2°F | Ht 74.0 in | Wt 188.0 lb

## 2019-01-01 DIAGNOSIS — G43001 Migraine without aura, not intractable, with status migrainosus: Secondary | ICD-10-CM | POA: Diagnosis not present

## 2019-01-01 DIAGNOSIS — G8929 Other chronic pain: Secondary | ICD-10-CM

## 2019-01-01 DIAGNOSIS — M5481 Occipital neuralgia: Secondary | ICD-10-CM

## 2019-01-01 DIAGNOSIS — R51 Headache: Secondary | ICD-10-CM | POA: Diagnosis not present

## 2019-01-01 DIAGNOSIS — R519 Headache, unspecified: Secondary | ICD-10-CM

## 2019-01-01 DIAGNOSIS — G4452 New daily persistent headache (NDPH): Secondary | ICD-10-CM

## 2019-01-01 NOTE — Progress Notes (Signed)
[facility]    Provider:  Dr Lucia GaskinsAhern Requesting Provider: Estanislado PandySasser, Paul W, MD Primary Care Provider:  Estanislado PandySasser, Paul W, MD  CC:  New daily persistent headache   Interval history 01/01/2019: Patient is here for follow up of headaches.  63 y.o. male here as requested by Estanislado PandySasser, Paul W, MD for headache. He has a PMHx of chronic pain, s/p spinal stimulator, anxiety, remote migraines, neck injury and chronic right arm weakness, HTN, HLD, DM, depression. Headaches since December acute onset one morning. I reviewed his Care Everywhere notes, he has sleep apnea per Ortho seen in 03/16/2014. Continous, still the middle of his eyes and radiates to his temples. He says he does not sleep. He dozes off and then wakes up. He is extremely tired during the day. He feels he forgets a lot of things. Shrill noises will make it worse but not light sensitivity, this is not a migraine. He doesn't wake up feeling rested. Severe sleep apnea. I reviewed his Care Everywhere notes, he has sleep apnea per Ortho seen in 03/16/2014. His wife is here. Ess 0.   Personally reviewed images and agree with the following: Normal MRI brain  HPI:  Kyle [name] is a 63 y.o. male here as requested by Estanislado PandySasser, Paul W, MD for headache. He has a PMHx of chronic pain, s/p spinal stimulator, anxiety, remote migraines, neck injury and chronic right arm weakness, HTN, HLD, DM, depression. Headaches since December acute onset, severe worst headache of his life. He has ESI injections but since the radioablation he has had a headache but did not coincide with the injection. Headaches started a month later. Headaches in January. No known inciting events. He woke up one morning and felt like somoene hit him with a hammer right in the middle behind the eyes. It is tender to touch. Continuous, never stops, it wakes him up at night, he wakes with it, worse with position and valsalva. No snoring, he has a spinal stimulator. He woke up with it  and it never went away. He is in a lot of pain. Constant, continuous 8/10 pain. Very shrill sounds make it worse. Nothing makes it better. Not positional. It throbs. No light or sound sensitivity (except for above), no nausea, no vomiting, sensitive to the touch at the bridge of the nose and radiates to the eyebrows into the temples. He feels more dizzy lately. He feels unbalanced even when sitting. No congestion no fever. Symmetric to the temples. Many years ago he had migraines, chocolate and a medication maybe triggered it and it was 1990 and nothing like this. Did not help him at all. No other focal neurologic deficits, associated symptoms, inciting events or modifiable factors.   Review of Systems: Patient complains of symptoms per HPI as well as the following symptoms: headache. Pertinent negatives and positives per HPI. All others negative.   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[employer] Needs   Financial resource strain: Not on file   Food insecurity    Worry: Not on file    Inability: Not on file   Transportation needs    Medical: Not on file    Non-medical: Not on file  Tobacco Use   Smoking status: Former Smoker    Quit date:  1987    Years since quitting: 33.4   Smokeless tobacco: Never Used   Tobacco comment: 3-4 per day  Substance and Sexual Activity   Alcohol use: Yes    Comment: occasionally   Drug use: Never   Sexual activity: Not on file  Lifestyle   Physical activity    Days per week: Not on file    Minutes per session: Not on file   Stress: Not on file  Relationships   Social connections    Talks on phone: Not on file    Gets together: Not on file    Attends religious service: Not on file    Active member of club or organization: Not on file     Attends meetings of clubs or organizations: Not on file    Relationship status: Not on file   Intimate partner violence    Fear of current or ex partner: Not on file    Emotionally abused: Not on file    Physically abused: Not on file    Forced sexual activity: Not on file  Other Topics Concern   Not on file  Social History Narrative   Lives at home with his wife   Right handed   Caffeine: 1-2 daily    Family History  Problem Relation Age of Onset   Brain cancer Mother    Heart disease Father    Heart attack Father    Diabetes Maternal Grandfather    Cancer Paternal Grandmother     Past Medical History:  Diagnosis Date   Anxiety    Depression    Diabetes ([place])    High cholesterol    Hypertension    Migraine    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 Under care of Cyris Bakhit in [place] for pain management since 2000   OSA (obstructive sleep apnea)     Patient Active Problem List   Diagnosis Date Noted   NDPH (new daily persistent headache) 11/28/2018    Past Surgical History:  Procedure Laterality Date   [place]   CHOLECYSTECTOMY     2010?   FEMUR SURGERY  1972   spinal stimulator implanted  2001   spinal stimulator replaced  2011   tooth removal  2020    Current Outpatient Medications  Medication Sig Dispense Refill   buPROPion (WELLBUTRIN XL) 150 MG 24 hr tablet Take 150 mg by mouth 3 (three) times daily.     DULoxetine (CYMBALTA) 20 MG capsule Take 20 mg by mouth daily.     gabapentin (NEURONTIN) 300 MG capsule Take 2 capsules (600 mg total) by mouth 3 (three) times daily. 180 capsule 11   glipiZIDE (GLUCOTROL XL) 10 MG 24 hr tablet Take 10 mg by mouth 2 (two) times daily.     oxyCODONE-acetaminophen (PERCOCET/ROXICET) 5-325 MG tablet Take 2 tablets by mouth 3 (three) times daily as needed.      tiZANidine (ZANAFLEX) 2 MG tablet Take 2 mg by  mouth 2 (two) times a day.     trazodone (DESYREL) 300 MG tablet Take 300 mg by mouth at bedtime.     zolpidem (AMBIEN CR) 12.5 MG CR tablet Take 12.5 mg by mouth at bedtime.     No current facility-administered medications for this visit.     Allergies as of 01/01/2019 - Review Complete 01/01/2019  Allergen Reaction Noted   Aspirin  11/20/2018  Vitals: BP (!) 147/83 (BP Location: Right Arm, Patient Position: Sitting)    Temp 98.2 F (36.8 C)    Ht 6\' 2"  (1.88 m)    Wt 188 lb (85.3 kg)    BMI 24.14 kg/m  Last Weight:  Wt Readings from Last 1 Encounters:  01/01/19 188 lb (85.3 kg)   Last Height:   Ht Readings from Last 1 Encounters:  01/01/19 6\' 2"  (1.88 m)     Physical exam: Exam: Gen: NAD, conversant, well nourised, obese, well groomed                     CV: RRR, no MRG. No Carotid Bruits. No peripheral edema, warm, nontender Eyes: Conjunctivae clear without exudates or hemorrhage. Sweeling above the eyes, tenderness to palpation along the supraorbital ridge.  Neuro: Detailed Neurologic Exam  Speech:    Speech is normal; fluent and spontaneous with normal comprehension.  Cognition:    The patient is oriented to person, place, and time;     recent and remote memory intact;     language fluent;     normal attention, concentration,     fund of knowledge Cranial Nerves:    The pupils are equal, round, and reactive to light. The fundi are normal and spontaneous venous pulsations are present. Visual fields are full to finger confrontation. Extraocular movements are intact. Trigeminal sensation is intact and the muscles of mastication are normal. The face is symmetric. The palate elevates in the midline. Hearing intact. Voice is normal. Shoulder shrug is normal. The tongue has normal motion without fasciculations.   Coordination:    Normal finger to nose  Gait:    Good stride and arm swing  Motor Observation:    No asymmetry, no atrophy, and no involuntary  movements noted. Tone:    Normal muscle tone.    Posture:    Posture is normal. normal erect    Strength: prox weakness right arm (chronic) otherwise strength is V/V in the upper and lower limbs.      Sensation: intact to LT     Reflex Exam:  DTR's:    Deep tendon reflexes in the upper and lower extremities are hyporeflexic but symmetric bilaterally.   Toes:    The toes are downgoing bilaterally.   Clonus:    Clonus is absent.    Assessment/Plan:  63 y.o. male here as requested by Estanislado PandySasser, Paul W, MD for headache. He has a PMHx of chronic pain, s/p spinal stimulator, anxiety, remote migraines, neck injury and chronic right arm weakness, HTN, HLD, DM, depression. Headaches since December acute onset, frontal, continuous, intractable after RFA of the cervical spine.  Nerve blocks significantly improved pain today from 8-9/10 to 3-4/10. Also gave him Depacon infusion. Will see results and possibly repea nerve blocks or increase Gabapentin/start Lyrica or change to Depakote. They will email me tonight and tomorrow with updates.  Performed by Dr. Lucia GaskinsAhern M.D All procedures a documented blood were medically necessary, reasonable and appropriate based on the patient's history, medical diagnosis and physician opinion. Verbal informed consent was obtained from the patient, patient was informed of potential risk of procedure, including bruising, bleeding, hematoma formation, infection, muscle weakness, muscle pain, numbness, transient hypertension, transient hyperglycemia and transient insomnia among others. All areas injected were topically clean with isopropyl rubbing alcohol. Nonsterile nonlatex gloves were worn during the procedure.  1. Greater occipital nerve block [phone]([id number]). The greater occipital nerve site was identified at the nuchal line medial to  the occipital artery. Medication was injected into the left and right occipital nerve areas and suboccipital areas. Patient's condition is  associated with inflammation of the greater occipital nerve and associated multiple groups. Injection was deemed medically necessary, reasonable and appropriate. Injection represents a separate and unique surgical service.  2. Supraorbital nerve block (64400): Supraorbital nerve site was identified along the incision of the frontal bone on the orbital/supraorbital ridge. Medication was injected into the left and right supraorbital nerve areas. Patient's condition is associated with inflammation of the supraorbital and associated muscle groups. Injection was deemed medically necessary, reasonable and appropriate. Injection represents a separate and unique surgical service.  A total of 45 minutes was spent face-to-face with this patient. Over half this time was spent on counseling patient on the No diagnosis found. diagnosis and different diagnostic and therapeutic options, counseling and coordination of care, risks ans benefits of management, compliance, or risk factor reduction and education.  This does not include nerve blocks time for procedure.   Cc: Sasser, Clarene CritchleyPaul W, MD  Naomie DeanAntonia Rakesha Dalporto, MD  [facility] [address] [place], [place] [id number]  Phone [phone] Fax [phone] .

## 2019-01-01 NOTE — Progress Notes (Signed)
Nerve block w/o steroid: Patient signed consent  0.5% Bupivocaine21mL EPP:[id number] EXP:07/2019 SAY:30160-109-32  2% Lidocaine9 mL LOT:[id number] EXP:01/15/2020 TFT:7322-0254-27

## 2019-01-07 ENCOUNTER — Telehealth: Payer: Self-pay | Admitting: Neurology

## 2019-01-07 NOTE — Telephone Encounter (Signed)
I called the pt and discussed appt. Scheduled him for nerve blocks tomorrow 6/24 @ 11:00 AM. Pt passed covid19 screening questions and will bring a mask. He verbalized appreciation for the call.

## 2019-01-07 NOTE — Telephone Encounter (Signed)
Can we see him tomorrow for nerve blocks please?

## 2019-01-08 ENCOUNTER — Other Ambulatory Visit: Payer: Self-pay

## 2019-01-08 ENCOUNTER — Ambulatory Visit: Payer: Medicare PPO | Admitting: Neurology

## 2019-01-08 ENCOUNTER — Encounter: Payer: Self-pay | Admitting: Neurology

## 2019-01-08 DIAGNOSIS — R51 Headache: Secondary | ICD-10-CM

## 2019-01-08 DIAGNOSIS — G8929 Other chronic pain: Secondary | ICD-10-CM

## 2019-01-08 DIAGNOSIS — R519 Headache, unspecified: Secondary | ICD-10-CM

## 2019-01-08 NOTE — Progress Notes (Signed)
Great response from the nerve blocks. Lasted about a week. Will repeat weekly and then hopefully spread them out until the headache resolves.   Performed by Dr. Jaynee Eagles M.D. . All procedures a documented blood were medically necessary, reasonable and appropriate based on the patient's history, medical diagnosis and physician opinion. Verbal informed consent was obtained from the patient, patient was informed of potential risk of procedure, including bruising, bleeding, hematoma formation, infection, muscle weakness, muscle pain, numbness, transient hypertension, transient hyperglycemia and transient insomnia among others. All areas injected were topically clean with isopropyl rubbing alcohol. Nonsterile nonlatex gloves were worn during the procedure.   Today pain went from 6/10 to a 0/10, he felt great. Will see how long this lasts before scheduling another.   1. Greater occipital nerve block [phone]). The greater occipital nerve site was identified at the nuchal line medial to the occipital artery. Medication was injected into the left and right occipital nerve areas and suboccipital areas. Patient's condition is associated with inflammation of the greater occipital nerve and associated multiple groups. Injection was deemed medically necessary, reasonable and appropriate. Injection represents a separate and unique surgical service.  2. Lesser occipital nerve block [phone]). The lesser occipital nerve site was identified approximately 2 cm lateral to the greater occipital nerve. Occasion was injected into the left and right occipital nerve areas. Patient's condition is associated with inflammation of the lesser occipital nerve and associated muscle groups. Injection was deemed medically necessary, reasonable and appropriate. Injection represents a separate and unique surgical service.  3. Auriculotemporal nerve block (17001): The Auriculotemporal nerve site was identified along the posterior margin of the  sternocleidomastoid muscle toward the base of the ear. Medication was injected into the left and right radicular temporal nerve areas. Patient's condition is associated with inflammation of the Auriculotemporal Nerve and associated muscle groups. Injection was deemed medically necessary, reasonable and appropriate. Injection represents a separate and unique surgical service.  4. Supraorbital nerve block (64400): Supraorbital nerve site was identified along the incision of the frontal bone on the orbital/supraorbital ridge. Medication was injected into the left and right supraorbital nerve areas. Patient's condition is associated with inflammation of the supraorbital and associated muscle groups. Injection was deemed medically necessary, reasonable and appropriate. Injection represents a separate and unique surgical service.

## 2019-01-08 NOTE — Progress Notes (Addendum)
Nerve block w/o steroid: Pt signed consent  0.5% Bupivocaine 15 mL LOT: [id number] EXP: 02/2020 NDC: 84536-468-03  2% Lidocaine 15 mL LOT: [id number] EXP: 01/15/20 NDC: 5003-7048-88

## 2019-01-13 ENCOUNTER — Telehealth: Payer: Self-pay | Admitting: Neurology

## 2019-01-13 NOTE — Telephone Encounter (Signed)
Can you please call patient and get him an appointment this week for nerve blocks?  Thank you!

## 2019-01-14 NOTE — Telephone Encounter (Signed)
I spoke with the patient and offered nerve blocks this week. He was unable to come today. He will come this Thurs at noon. He denied any new respiratory symptoms, fever, cough, SOB. He denied any recent travel. He denied any exposure to anyone who has tested positive for COVID19 or is suspected of having it. He will bring a mask. He verbalized appreciation for the call.

## 2019-01-16 ENCOUNTER — Ambulatory Visit: Payer: Self-pay | Admitting: Neurology

## 2019-01-21 ENCOUNTER — Encounter (HOSPITAL_COMMUNITY): Payer: Self-pay

## 2019-01-21 ENCOUNTER — Emergency Department (HOSPITAL_COMMUNITY)
Admission: EM | Admit: 2019-01-21 | Discharge: 2019-01-21 | Disposition: A | Discharge status: Home / Self Care | Payer: Medicare PPO | Attending: Emergency Medicine | Admitting: Emergency Medicine

## 2019-01-21 ENCOUNTER — Ambulatory Visit: Payer: Medicare PPO | Admitting: Neurology

## 2019-01-21 ENCOUNTER — Other Ambulatory Visit: Payer: Self-pay

## 2019-01-21 ENCOUNTER — Telehealth: Payer: Self-pay | Admitting: Neurology

## 2019-01-21 VITALS — BP 181/112 | HR 90 | Temp 98.4°F

## 2019-01-21 DIAGNOSIS — E119 Type 2 diabetes mellitus without complications: Secondary | ICD-10-CM | POA: Diagnosis not present

## 2019-01-21 DIAGNOSIS — R51 Headache: Secondary | ICD-10-CM

## 2019-01-21 DIAGNOSIS — R42 Dizziness and giddiness: Secondary | ICD-10-CM | POA: Diagnosis not present

## 2019-01-21 DIAGNOSIS — Z7984 Long term (current) use of oral hypoglycemic drugs: Secondary | ICD-10-CM | POA: Insufficient documentation

## 2019-01-21 DIAGNOSIS — R4182 Altered mental status, unspecified: Secondary | ICD-10-CM | POA: Insufficient documentation

## 2019-01-21 DIAGNOSIS — M5481 Occipital neuralgia: Secondary | ICD-10-CM

## 2019-01-21 DIAGNOSIS — R519 Headache, unspecified: Secondary | ICD-10-CM

## 2019-01-21 DIAGNOSIS — Z79899 Other long term (current) drug therapy: Secondary | ICD-10-CM | POA: Insufficient documentation

## 2019-01-21 DIAGNOSIS — H538 Other visual disturbances: Secondary | ICD-10-CM | POA: Diagnosis not present

## 2019-01-21 DIAGNOSIS — I10 Essential (primary) hypertension: Secondary | ICD-10-CM | POA: Insufficient documentation

## 2019-01-21 DIAGNOSIS — Z87891 Personal history of nicotine dependence: Secondary | ICD-10-CM | POA: Diagnosis not present

## 2019-01-21 DIAGNOSIS — E1165 Type 2 diabetes mellitus with hyperglycemia: Secondary | ICD-10-CM | POA: Diagnosis not present

## 2019-01-21 DIAGNOSIS — R404 Transient alteration of awareness: Secondary | ICD-10-CM | POA: Diagnosis not present

## 2019-01-21 DIAGNOSIS — T50905A Adverse effect of unspecified drugs, medicaments and biological substances, initial encounter: Secondary | ICD-10-CM

## 2019-01-21 DIAGNOSIS — T43215A Adverse effect of selective serotonin and norepinephrine reuptake inhibitors, initial encounter: Secondary | ICD-10-CM | POA: Diagnosis not present

## 2019-01-21 DIAGNOSIS — R29810 Facial weakness: Secondary | ICD-10-CM | POA: Diagnosis not present

## 2019-01-21 LAB — ETHANOL: Alcohol, Ethyl (B): <10 mg/dL (ref <10)

## 2019-01-21 LAB — BASIC METABOLIC PANEL
Anion gap: 8 (ref 5–15)
BUN: 12 mg/dL (ref 8–23)
CO2: 29 mmol/L (ref 22–32)
Calcium: 9 mg/dL (ref 8.9–10.3)
Chloride: 99 mmol/L (ref 98–111)
Creatinine, Ser: 0.83 mg/dL (ref 0.61–1.24)
GFR calc Af Amer: >60 mL/min (ref >60)
GFR calc non Af Amer: >60 mL/min (ref >60)
Glucose, Bld: 261 mg/dL — ABNORMAL HIGH (ref 70–99)
Potassium: 3.8 mmol/L (ref 3.5–5.1)
Sodium: 136 mmol/L (ref 135–145)

## 2019-01-21 LAB — CBC
HCT: 45.3 % (ref 39.0–52.0)
Hemoglobin: 15.4 g/dL (ref 13.0–17.0)
MCH: 29.8 pg (ref 26.0–34.0)
MCHC: 34 g/dL (ref 30.0–36.0)
MCV: 87.8 fL (ref 80.0–100.0)
Platelets: 362 10*3/uL (ref 150–400)
RBC: 5.16 MIL/uL (ref 4.22–5.81)
RDW: 11.9 % (ref 11.5–15.5)
WBC: 8.2 10*3/uL (ref 4.0–10.5)
nRBC: 0 % (ref 0.0–0.2)

## 2019-01-21 NOTE — Progress Notes (Signed)
Nerve block w/o steroid: Pt signed consent  0.5% Bupivocaine 5 mL LOT: [id number] EXP: AUG2021 NDC: 05397-673-41  2% Lidocaine 5 mL LOT: [id number] EXP: 02/15/2019 NDC: 9379-0240-97

## 2019-01-21 NOTE — Telephone Encounter (Signed)
Called pt's wife and LVM advising Dr. Jaynee Eagles will call her this evening. Left office number in message.

## 2019-01-21 NOTE — Progress Notes (Signed)
Great response from the nerve blocks. Lasted about a week. Will repeat weekly and then hopefully spread them out until the headache resolves.  His headaches have significantly improved, now he has some residual about 7 out of 10 at the right temple but the pain in his neck, the pain in the occipital areas in the left temporal area have improved significantly.  Today we performed the nerve blocks.  Patient was dizzy afterwards and also told me afterwards that he had to sit for about 45 minutes after his last injection.  I suggested he stay in the clinic and lay on the table just to ensure no side effects.  At that time patient had acute altered mental status and appeared to be inebriated however he had not drank any alcohol whatsoever.  I have never seen a reaction to nerve blocks such as this.  We called EMS and sent him to the emergency room where the physician also thought that he appeared inebriated but alcohol levels less than 0.  This was an extremely unusual reaction to lidocaine and bupivacaine injections.  I called the hospital and spoke to the physician there.  Patient improved after 30 minutes, his glucose was elevated at 350 due to steroids and I asked him to follow-up with his primary care also on antibiotics recently for a tooth pulled.  However I do not think that it was the steroids, I believe this was a very atypical medication reaction.  Fortunately I spoke to his wife this evening and his headaches are now resolved.  Performed by Dr. Stepen Prins M.D. . All procedures a Lucia Gaskinsdocumented blood were medically necessary, reasonable and appropriate based on the patient's history, medical diagnosis and physician opinion. Verbal informed consent was obtained from the patient, patient was informed of potential risk of procedure, including bruising, bleeding, hematoma formation, infection, muscle weakness, muscle pain, numbness, transient hypertension, transient hyperglycemia and transient insomnia among others. All  areas injected were topically clean with isopropyl rubbing alcohol. Nonsterile nonlatex gloves were worn during the procedure.   Today pain went from 7/10 to a 0/10, he felt great. Will see how long this lasts however given his reaction to the medication and will think we can perform nerve blocks again in the future.   1. Greater occipital nerve block [phone]([id number]). The greater occipital nerve site was identified at the nuchal line medial to the occipital artery. Medication was injected into the left and right occipital nerve areas and suboccipital areas. Patient's condition is associated with inflammation of the greater occipital nerve and associated multiple groups. Injection was deemed medically necessary, reasonable and appropriate. Injection represents a separate and unique surgical service.  2. Lesser occipital nerve block [phone](64450). The lesser occipital nerve site was identified approximately 2 cm lateral to the greater occipital nerve. Occasion was injected into the left and right occipital nerve areas. Patient's condition is associated with inflammation of the lesser occipital nerve and associated muscle groups. Injection was deemed medically necessary, reasonable and appropriate. Injection represents a separate and unique surgical service.  3. Auriculotemporal nerve block ([id number]): The Auriculotemporal nerve site was identified along the posterior margin of the sternocleidomastoid muscle toward the base of the ear. Medication was injected into the left and right radicular temporal nerve areas. Patient's condition is associated with inflammation of the Auriculotemporal Nerve and associated muscle groups. Injection was deemed medically necessary, reasonable and appropriate. Injection represents a separate and unique surgical service.  4. Supraorbital nerve block (64400): Supraorbital nerve site was identified along  the incision of the frontal bone on the orbital/supraorbital ridge. Medication was injected  into the left and right supraorbital nerve areas. Patient's condition is associated with inflammation of the supraorbital and associated muscle groups. Injection was deemed medically necessary, reasonable and appropriate. Injection represents a separate and unique surgical service.

## 2019-01-21 NOTE — Discharge Instructions (Signed)
It is uncertain why you had this reaction today, although it appears to be related to your injections you received at the neurologist office.  This would be an unusual side effect/reaction to this medication, although all of your blood work was normal and your alcohol levels were normal as well.  In the future, I would make sure that you have someone who can monitor you after these injections if you continue to get them.  Please call your primary care doctor and your neurologist to see if they would like to have a follow-up appointment.

## 2019-01-21 NOTE — ED Triage Notes (Signed)
Per [facility], pt from [facility] after having nerve block for headaches, pt became altered afterward with dizziness. Negative stroke scale. CBG 350, BP 198/112, HR 97.7, 96% on RA, HR 96. Pt axox3, does not know location, which is not normal for patient.

## 2019-01-21 NOTE — Telephone Encounter (Signed)
I spoke with her, he is well, very atypical reaction.

## 2019-01-21 NOTE — ED Provider Notes (Signed)
[name] Provider Note   CSN: [id number] Arrival date & time: 01/21/19  1324    History   Chief Complaint Chief Complaint  Patient presents with  . Medication Reaction    HPI Kyle [name] is a 63 y.o. male.     Patient states that he went to his neurologist, Dr. Daisy BlossomAhearn, today to get injections for his headaches.  He says that he has had headaches for several years and that these injections help relieve his pain.  He then says that he drank 10 shots of tequila today and that is why he is in the hospital.  He said "I did it again" and "my wife is going to be so mad at me".  When asked when he was able to consume alcohol while also going to the neurologist today the patient said that he drank the tequila shots with Dr. Daisy BlossomAhearn.  He says the last time he had this injection procedure performed he was dizzy and sat in his truck for about 45 minutes before driving home.  According to his wife, the patient has been drunk maybe 4 times in the past 41 years that she is known him, but she does say that when she has been drunk this is how he has acted before.  The patient's wife does not believe he actually had anything to drink today.  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  For this he takes Percocet, trazodone, gabapentin.  Patient also takes Wellbutrin and glipizide for diabetes.  Patient recently had a dental procedure for which he developed severe dry socket and was given amoxicillin and a steroid, but other than that has not changed any medications recently.     Past Medical History:  Diagnosis Date  . Anxiety   . Depression   . Diabetes (HCC)   . High cholesterol   . Hypertension   . Migraine   . Nerve damage 1998   neck/right shoulder; machinery fell on pt at work; damage to brachial plexus nerve on  R. [place] [facility] [place]  . OSA (obstructive sleep apnea)     Patient Active Problem List   Diagnosis Date Noted  . NDPH (new daily persistent headache) 11/28/2018    Past Surgical History:  Procedure Laterality Date  . BACK SURGERY  1985  . CHOLECYSTECTOMY     2010?  [name]. FEMUR SURGERY  1972  . spinal stimulator implanted  2001  . spinal stimulator replaced  2011  . teeth removal  2020   2  . tooth removal  2020        Home Medications    Prior to Admission medications   Medication Sig Start Date End Date Taking? Authorizing Provider  buPROPion (WELLBUTRIN XL) 150 MG 24 hr tablet Take 150 mg by mouth 3 (three) times daily.    [provider]  DULoxetine (CYMBALTA) 20 MG capsule Take 20 mg by mouth daily.    [provider]  gabapentin (NEURONTIN) 300 MG capsule TAKE 2 CAPSULES BY MOUTH 3 TIMES A DAY 01/01/19   Anson FretAhern, Antonia B, MD  glipiZIDE (GLUCOTROL XL) 10 MG 24 hr tablet Take 10 mg by mouth 2 (two) times daily. 07/16/18   [provider]  oxyCODONE-acetaminophen (PERCOCET/ROXICET) 5-325 MG tablet Take 2  tablets by mouth 3 (three) times daily as needed.     [provider]  tiZANidine (ZANAFLEX) 2 MG tablet Take 2 mg by mouth 2 (two) times a day.    [provider]  trazodone (DESYREL) 300 MG tablet Take 300 mg by mouth at bedtime.    [provider]  zolpidem (AMBIEN CR) 12.5 MG CR tablet Take 12.5 mg by mouth at bedtime.    [provider]    Family History Family History  Problem Relation Age of Onset  . Brain cancer Mother   . Heart disease Father   . Heart attack Father   . Diabetes Maternal Grandfather   . Cancer Paternal Grandmother     Social History Social History   Tobacco Use  . Smoking status: Former Smoker    Quit date: 1987    Years since quitting: 33.5  . Smokeless tobacco: Never Used  . Tobacco comment: 3-4 per day  Substance Use  Topics  . Alcohol use: Yes    Comment: occasionally  . Drug use: Never     Allergies   Aspirin   Review of Systems Review of Systems  Constitutional: Negative for fever.  Eyes: Positive for visual disturbance.       Blurriness  Respiratory: Negative for cough, chest tightness and shortness of breath.   Cardiovascular: Negative for chest pain.  Gastrointestinal: Negative for abdominal pain, diarrhea, nausea and vomiting.  Genitourinary: Negative for dysuria.  Musculoskeletal: Positive for arthralgias.  Neurological: Positive for dizziness. Negative for speech difficulty.     Physical Exam Updated Vital Signs BP (!) 169/82   Pulse 65   Temp (!) 97.5 F (36.4 C) (Oral)   Resp 18   SpO2 99%   Physical Exam Constitutional:      General: He is not in acute distress.    Appearance: He is normal weight.  HENT:     Head: Normocephalic and atraumatic.     Nose: No congestion or rhinorrhea.     Mouth/Throat:     Mouth: Mucous membranes are moist.     Pharynx: Oropharynx is clear. No oropharyngeal exudate or posterior oropharyngeal erythema.  Eyes:     Extraocular Movements: Extraocular movements intact.     Conjunctiva/sclera: Conjunctivae normal.     Pupils: Pupils are equal, round, and reactive to light.  Neck:     Musculoskeletal: Normal range of motion.  Cardiovascular:     Rate and Rhythm: Normal rate and regular rhythm.     Pulses: Normal pulses.     Heart sounds: No murmur.  Pulmonary:     Effort: Pulmonary effort is normal.     Breath sounds: No wheezing or rhonchi.  Abdominal:     General: Abdomen is flat.     Palpations: Abdomen is soft.     Tenderness: There is no abdominal tenderness.  Musculoskeletal:        General: Tenderness present.     Comments: Tenderness to palpation in the right shoulder  Lymphadenopathy:     Cervical: No cervical adenopathy.  Neurological:     General: No focal deficit present.     Mental Status: He is alert and oriented  to person, place, and time.     Cranial Nerves: No cranial nerve deficit.     Sensory: No sensory deficit.     Coordination: Coordination normal.  Psychiatric:     Comments: Patient appears elated and intoxicated.  Appears to be confabulating stories about drinking alcohol.  ED Treatments / Results  Labs (all labs ordered are listed, but only abnormal results are displayed) Labs Reviewed  CBC  ETHANOL  BASIC METABOLIC PANEL    EKG EKG Interpretation  Date/Time:  Tuesday January 21 2019 13:33:06 EDT Ventricular Rate:  66 PR Interval:    QRS Duration: 114 QT Interval:  392 QTC Calculation: 411 R Axis:   58 Text Interpretation:  Sinus rhythm Incomplete right bundle branch block No previous tracing Confirmed by Blanchie Dessert [phone]) on 01/21/2019 2:13:19 PM   Radiology No results found.  Procedures Procedures (including critical care time)  Medications Ordered in ED Medications - No data to display   Initial Impression / Assessment and Plan / ED Course  I have reviewed the triage vital signs and the nursing notes.  Pertinent labs & imaging results that were available during my care of the patient were reviewed by me and considered in my medical decision making (see chart for details).        Patient is a 63 year old man who has a history of headaches for which she gets local anesthetic injections by his neurologist.  He was getting on those injections earlier today and afterwards that he started to feel "dizzy" which is similar to the last time he had when these injections.  Patient then began acting strangely, as if he was intoxicated per the neurologist, Dr. Lavell Anchors.  Patient was then transferred to the emergency department, where he is observed to be under the influence of alcohol (talking about drinking 10 shots of tequila, saying inappropriate comments about nurses and male physicians.)  His physical exam was benign, no neural deficits.  Vitals were stable.   BMP, CBC, and ethanol level were ordered.  Went back to check on the patient approximately 30 minutes later and he appeared to be much better and back to his normal self.  He said he was embarrassed by the way he is behaving.  We will follow this patient over the next hour to hour while the rest of his lab work comes back.  CBC was normal, BMP and ethanol pending.  If his symptoms improve and his lab work is normal we can discharge patient home with close follow-up with PCP and his neurologist.  I have signed off patient to afternoon team.  Final Clinical Impressions(s) / ED Diagnoses   Final diagnoses:  None    ED Discharge Orders    None       Benay Pike, MD 01/21/19 1506    Blanchie Dessert, MD 01/24/19 2227

## 2019-01-21 NOTE — Telephone Encounter (Signed)
Pt's wife called wanting to speak to the provider about the pt's reaction he had today. Please advise.

## 2019-01-29 ENCOUNTER — Encounter: Payer: Self-pay | Admitting: *Deleted

## 2019-01-30 ENCOUNTER — Ambulatory Visit: Payer: Self-pay | Admitting: Neurology

## 2019-02-12 ENCOUNTER — Telehealth: Payer: Self-pay | Admitting: Neurology

## 2019-02-12 NOTE — Telephone Encounter (Signed)
Pt's wife called wanting to discuss the pt's nerve blocks or other options that are available for the pt to try with the RN or provider. Please advise.

## 2019-02-12 NOTE — Telephone Encounter (Signed)
Put patient on schedule for 4pm nerve block.

## 2019-02-12 NOTE — Telephone Encounter (Signed)
I contacted the pt's wife and left a vm advising we had received the my chart message in regards to this topic and are waiting for Dr. Jaynee Eagles to review. Advised she could call back if need be.

## 2019-02-13 ENCOUNTER — Other Ambulatory Visit: Payer: Self-pay

## 2019-02-13 ENCOUNTER — Ambulatory Visit: Payer: Medicare PPO | Admitting: Neurology

## 2019-02-13 ENCOUNTER — Ambulatory Visit (INDEPENDENT_AMBULATORY_CARE_PROVIDER_SITE_OTHER): Payer: Medicare PPO | Admitting: Neurology

## 2019-02-13 VITALS — BP 128/74 | HR 70 | Temp 98.2°F

## 2019-02-13 DIAGNOSIS — R51 Headache: Secondary | ICD-10-CM | POA: Diagnosis not present

## 2019-02-13 DIAGNOSIS — R519 Headache, unspecified: Secondary | ICD-10-CM

## 2019-02-13 NOTE — Telephone Encounter (Signed)
Scheduled

## 2019-02-13 NOTE — Progress Notes (Signed)
Patient in office for nerve block.  Lidocaine 2% lot #[id number],  exp 08/18/2019 Bupivacaine 0.5%  Lot# [id number], exp 02/2019

## 2019-02-13 NOTE — Patient Instructions (Signed)
Transient Global Amnesia Transient global amnesia causes a sudden and temporary (transient) loss of memory (amnesia). You may recall memories from your distant past and people you know well. However, you may not recall things that happened more recently in the past days, months, or even year. A transient global amnesia episode does not last longer than 24 hours. Transient global amnesia does not affect your other brain functions. Your memory usually returns to normal after an episode is over. One episode of transient global amnesia does not make you more likely to have a stroke, a relapse, or other complications. What are the causes? The cause of this condition is not known. Certain activities have been reported to trigger transient global amnesia. These activities include:  Swimming in very cold or hot water.  Sexual intercourse.  Emotional distress, such as receiving bad news or having a lot of stress at once.  Strenuous exercise or activity. What increases the risk? You are more likely to develop this condition if:  You are 1650-64 years old.  You have a history of migraine headaches. What are the signs or symptoms? The main symptoms of this condition include:  Being unable to remember recent events.  Asking repetitive questions about a situation and surroundings and not recalling the answers to these questions. Other symptoms include:  Restlessness and nervousness.  Confusion.  Headaches.  Dizziness.  Nausea. How is this diagnosed? This condition may be diagnosed based on:  Your symptoms.  A physical exam.  A test to check your mental abilities (cognitive evaluation).  Imaging studies to check brain function. These may include: ? Electroencephalogram (EEG). This test checks the brain's electrical activity. ? CT scan. ? MRI. How is this treated? There is no treatment for this condition. An episode typically goes away on its own after a few hours. You may receive  medicines to treat other conditions, such as a migraine. Follow these instructions at home:  Take over-the-counter and prescription medicines only as told by your health care provider.  Avoid taking medicines that can affect thinking, such as pain or sleeping medicines.  Learn what activities may trigger an episode. Avoid these activities as told by your health care provider.  Find ways to manage stress, such as meditation or yoga.  Keep all follow-up visits as told by your health care provider. This is important. Contact a health care provider if you:  Have a migraine that does not go away.  Experience transient global amnesia repeatedly. Get help right away if you:  Have a seizure. Summary  Transient global amnesia causes a sudden and temporary (transient) loss of memory (amnesia).  There is no treatment for this condition. An episode typically goes away on its own after a few hours.  You may receive medicines to treat other conditions, such as a migraine.  Transient global amnesia does not affect your other brain functions. Your memory usually returns to normal after an episode is over. This information is not intended to replace advice given to you by your health care provider. Make sure you discuss any questions you have with your health care provider. Document Released: 08/10/2004 Document Revised: 07/18/2017 Document Reviewed: 07/18/2017 Elsevier Patient Education  2020 [employer]  ButlerFremanezumab injection What is this medicine? FREMANEZUMAB (fre ma NEZ ue mab) is used to prevent migraine headaches. This medicine may be used for other purposes; ask your health care provider or pharmacist if you have questions. COMMON BRAND NAME(S): AJOVY What should I tell my health care provider  before I take this medicine? They need to know if you have any of these conditions:  an unusual or allergic reaction to fremanezumab, other medicines, foods, dyes, or preservatives   pregnant or trying to get pregnant  breast-feeding How should I use this medicine? This medicine is for injection under the skin. You will be taught how to prepare and give this medicine. Use exactly as directed. Take your medicine at regular intervals. Do not take your medicine more often than directed. It is important that you put your used needles and syringes in a special sharps container. Do not put them in a trash can. If you do not have a sharps container, call your pharmacist or healthcare provider to get one. Talk to your pediatrician regarding the use of this medicine in children. Special care may be needed. Overdosage: If you think you have taken too much of this medicine contact a poison control center or emergency room at once. NOTE: This medicine is only for you. Do not share this medicine with others. What if I miss a dose? If you miss a dose, take it as soon as you can. If it is almost time for your next dose, take only that dose. Do not take double or extra doses. What may interact with this medicine? Interactions are not expected. This list may not describe all possible interactions. Give your health care provider a list of all the medicines, herbs, non-prescription drugs, or dietary supplements you use. Also tell them if you smoke, drink alcohol, or use illegal drugs. Some items may interact with your medicine. What should I watch for while using this medicine? Tell your doctor or healthcare professional if your symptoms do not start to get better or if they get worse. What side effects may I notice from receiving this medicine? Side effects that you should report to your doctor or health care professional as soon as possible:  allergic reactions like skin rash, itching or hives, swelling of the face, lips, or tongue Side effects that usually do not require medical attention (report these to your doctor or health care professional if they continue or are bothersome):  pain,  redness, or irritation at site where injected This list may not describe all possible side effects. Call your doctor for medical advice about side effects. You may report side effects to FDA at 1-800-FDA-1088. Where should I keep my medicine? Keep out of the reach of children. You will be instructed on how to store this medicine. Throw away any unused medicine after the expiration date on the label. NOTE: This sheet is a summary. It may not cover all possible information. If you have questions about this medicine, talk to your doctor, pharmacist, or health care provider.  2020 Elsevier/Gold Standard (2017-04-02 17:22:56)  Occipital Nerve Block Patient Information  Description: The occipital nerves originate in the cervical (neck) spinal cord and travel upward through muscle and tissue to supply sensation to the back of the head and top of the scalp.  In addition, the nerves control some of the muscles of the scalp.  Occipital neuralgia is an irritation of these nerves which can cause headaches, numbness of the scalp, and neck discomfort.     The occipital nerve block will interrupt nerve transmission through these nerves and can relieve pain and spasm.  The block consists of insertion of a small needle under the skin in the back of the head to deposit local anesthetic (numbing medicine) and/or steroids around the nerve.  The  entire block usually lasts less than 5 minutes.  Conditions which may be treated by occipital blocks:   Muscular pain and spasm of the scalp  Nerve irritation, back of the head  Headaches  Upper neck pain  Preparation for the injection:  1. Do not eat any solid food or dairy products within 8 hours of your appointment. 2. You may drink clear liquids up to 3 hours before appointment.  Clear liquids include water, black coffee, juice or soda.  No milk or cream please. 3. You may take your regular medication, including pain medications, with a sip of water before you  appointment.  Diabetics should hold regular insulin (if taken separately) and take 1/2 normal NPH dose the morning of the procedure.  Carry some sugar containing items with you to your appointment. 4. A driver must accompany you and be prepared to drive you home after your procedure. 5. Bring all your current medications with you. 6. An IV may be inserted and sedation may be given at the discretion of the physician. 7. A blood pressure cuff, EKG, and other monitors will often be applied during the procedure.  Some patients may need to have extra oxygen administered for a short period. 8. You will be asked to provide medical information, including your allergies and medications, prior to the procedure.  We must know immediately if you are taking blood thinners (like Coumadin/Warfarin) or if you are allergic to IV iodine contrast (dye).  We must know if you could possible be pregnant.  9. Do not wear a high collared shirt or turtleneck.  Tie long hair up in the back if possible.  Possible side-effects:   Bleeding from needle site  Infection (rare, may require surgery)  Nerve injury (rare)  Hair on back of neck can be tinged with iodine scrub (this will wash out)  Light-headedness (temporary)  Pain at injection site (several days)  Decreased blood pressure (rare, temporary)  Seizure (very rare)  Call if you experience:   Hives or difficulty breathing ( go to the emergency room)  Inflammation or drainage at the injection site(s)  Please note:  Although the local anesthetic injected can often make your painful muscles or headache feel good for several hours after the injection, the pain may return.  It takes 3-7 days for steroids to work.  You may not notice any pain relief for at least one week.  If effective, we will often do a series of injections spaced 3-6 weeks apart to maximally decrease your pain.  If you have any questions, please call [phone] [facility]  Peripheral Nerve Block  Peripheral nerve block is an injection of numbing medicine (regional anesthetic) near a nerve. The regional anesthetic numbs everything below the injection site. This provides pain relief during and after a medical procedure. Generally, you will be awake while a peripheral nerve block is performed. You also may receive medicines to help you feel relaxed and comfortable during the procedure (sedatives). When you have a peripheral nerve block, you are not exposed to the risks associated with medicine that makes you fall asleep (general anesthetic). You may also:  Need less pain medicine after your procedure.  Have a lower risk of blood clots.  Recover sooner. Tell a health care provider about:  Any allergies you have.  All medicines you are taking, including vitamins, herbs, eye drops, creams, and over-the-counter medicines.  Any problems you or family members have had with anesthetic medicines.  Any blood disorders you have.  Any surgeries you have.  Any medical conditions you have.  Whether you are pregnant or may be pregnant. What are the risks? Generally, this is a safe procedure. However, problems may occur, including:  Infection.  Bleeding.  Allergic reactions to medicines.  Damage to other structures or organs, such as temporary or permanent nerve damage. What happens before the procedure? Staying hydrated Follow instructions from your health care provider about hydration, which may include:  Up to 2 hours before the procedure - you may continue to drink clear liquids, such as water, clear fruit juice, black coffee, and plain tea. Eating and drinking restrictions Follow instructions from your health care provider about eating and drinking, which may include:  8 hours before the procedure - stop eating heavy meals or foods such as meat, fried foods, or fatty foods.  6 hours before the procedure - stop eating light  meals or foods, such as toast or cereal.  6 hours before the procedure - stop drinking milk or drinks that contain milk.  2 hours before the procedure - stop drinking clear liquids. General instructions  Ask your health care provider about: ? Changing or stopping your regular medicines. This is especially important if you are taking diabetes medicines or blood thinners. ? Taking medicines such as aspirin and ibuprofen. These medicines can thin your blood. Do not take these medicines unless your health care provider tells you to take them. ? Taking over-the-counter medicines, vitamins, herbs, and supplements.  Plan to have someone take you home from the hospital or clinic.  Plan to have a responsible adult care for you for at least 24 hours after you leave the hospital or clinic. This is important. What happens during the procedure?   An IV will be inserted into one of your veins.  You may be given a sedative.  Your nerve may be located by using: ? Sound waves that create images of the area (ultrasound). ? A device that activates the nerve and causes your muscles to twitch (nerve stimulator).  The skin around your injection site will be cleaned with a germ-killing solution.  Medicine to numb your injection site (local anesthetic) may be injected into the tissue above your nerve.  Regional anesthetic will be injected into the area near your nerve. ? The medicine will be injected around the nerve, not into it. ? You should not feel any pain. The area of your peripheral nerve block will begin to feel warm and numb.  A thin, flexible tube (catheter) may be inserted near your nerve. The catheter may remain there to continue delivering the regional anesthetic during and after your medical procedure.  When the area of your peripheral nerve block is completely numb, the medical procedure can be performed.  Your injection site may be covered with a bandage (dressing) when your medical  procedure is done. The procedure may vary among health care providers and hospitals. What can I expect after the procedure?  If you do not have a catheter, you may continue to be numb for up to 36 hours. The length of this time depends on how much anesthetic was injected.  If you have a catheter, you will continue to be numb until the catheter is removed.  To reduce your risk of injury: ? Do not expose the numb area to heat or cold. ? Do not stand up or try to walk without help if you have a nerve block in one or both  legs. Get help and limit your activity as told by your health care provider.  As the medicine wears off, you will have a gradual return of feeling in the area that is supplied by the nerve.  Your blood pressure, heart rate, breathing rate, and blood oxygen level will be monitored until the medicines you were given have worn off. Follow these instructions at home: Activity  Do not drive or use heavy machinery until your health care provider approves.  Do not lift anything that is heavier than 10 lb (4.5 kg), or the limit that you are told, until your health care provider says that it is safe. Injection site care  If you have a dressing, remove it 24 hours after your procedure, or as told by your health care provider.  Check your injection site every day for signs of infection. Check for: ? Redness, swelling, or pain. ? Warmth. ? Fluid or blood. ? Pus or a bad smell. General instructions  Take over-the-counter and prescription medicines only as told by your health care provider.  Do not take showers or baths, swim, or use a hot tub until your health care provider approves.  Keep all follow-up visits as told by your health care provider. This is important. Contact a health care provider if:  You continue to have numbness, weakness, or tingling after your medicine has worn off.  You have a fever.  You have redness, swelling, or pain around your injection site.  Get help right away if:  You have trouble breathing. Summary  Peripheral nerve block is an injection of numbing medicine (regional anesthetic) near a nerve. This provides pain relief during and after a medical procedure.  Feeling will gradually return to the area that is supplied by the nerve.  A catheter may be inserted to continue to provide medicine for up to several days. This information is not intended to replace advice given to you by your health care provider. Make sure you discuss any questions you have with your health care provider. Document Released: 10/10/2007 Document Revised: 08/19/2018 Document Reviewed: 04/25/2017 Elsevier Patient Education  2020 [employer].

## 2019-02-13 NOTE — Telephone Encounter (Signed)
Can you put him on my schedule for 4pm today for nerve blocks please? Thank you

## 2019-02-15 NOTE — Progress Notes (Signed)
Performed by Dr. Jaynee Eagles M.D. . All procedures a documented blood were medically necessary, reasonable and appropriate based on the patient's history, medical diagnosis and physician opinion. Verbal informed consent was obtained from the patient, patient was informed of potential risk of procedure, including bruising, bleeding, hematoma formation, infection, muscle weakness, muscle pain, numbness, transient hypertension, transient hyperglycemia and transient insomnia among others. All areas injected were topically clean with isopropyl rubbing alcohol. Nonsterile nonlatex gloves were worn during the procedure.   Today pain went from 7/10 to a 0/10, he felt great. Will see how long this lasts however given his reaction to the medication and will think we can perform nerve blocks again in the future.   1. Auriculotemporal nerve block (26415): The Auriculotemporal nerve site was identified along the posterior margin of the sternocleidomastoid muscle toward the base of the ear. Medication was injected into the left and right radicular temporal nerve areas. Patient's condition is associated with inflammation of the Auriculotemporal Nerve and associated muscle groups. Injection was deemed medically necessary, reasonable and appropriate. Injection represents a separate and unique surgical service.  2. Supraorbital nerve block (64400): Supraorbital nerve site was identified along the incision of the frontal bone on the orbital/supraorbital ridge. Medication was injected into the left and right supraorbital nerve areas. Patient's condition is associated with inflammation of the supraorbital and associated muscle groups. Injection was deemed medically necessary, reasonable and appropriate. Injection represents a separate and unique surgical service.

## 2019-02-17 DIAGNOSIS — R5383 Other fatigue: Secondary | ICD-10-CM | POA: Diagnosis not present

## 2019-02-17 DIAGNOSIS — F411 Generalized anxiety disorder: Secondary | ICD-10-CM | POA: Diagnosis not present

## 2019-02-17 DIAGNOSIS — K219 Gastro-esophageal reflux disease without esophagitis: Secondary | ICD-10-CM | POA: Diagnosis not present

## 2019-02-17 DIAGNOSIS — E1140 Type 2 diabetes mellitus with diabetic neuropathy, unspecified: Secondary | ICD-10-CM | POA: Diagnosis not present

## 2019-02-17 DIAGNOSIS — E1165 Type 2 diabetes mellitus with hyperglycemia: Secondary | ICD-10-CM | POA: Diagnosis not present

## 2019-02-19 DIAGNOSIS — F5101 Primary insomnia: Secondary | ICD-10-CM | POA: Diagnosis not present

## 2019-02-19 DIAGNOSIS — R51 Headache: Secondary | ICD-10-CM | POA: Diagnosis not present

## 2019-02-19 DIAGNOSIS — M542 Cervicalgia: Secondary | ICD-10-CM | POA: Diagnosis not present

## 2019-02-19 DIAGNOSIS — N400 Enlarged prostate without lower urinary tract symptoms: Secondary | ICD-10-CM | POA: Diagnosis not present

## 2019-02-19 DIAGNOSIS — E1140 Type 2 diabetes mellitus with diabetic neuropathy, unspecified: Secondary | ICD-10-CM | POA: Diagnosis not present

## 2019-02-19 DIAGNOSIS — Z6824 Body mass index (BMI) 24.0-24.9, adult: Secondary | ICD-10-CM | POA: Diagnosis not present

## 2019-02-19 DIAGNOSIS — E1165 Type 2 diabetes mellitus with hyperglycemia: Secondary | ICD-10-CM | POA: Diagnosis not present

## 2019-02-20 ENCOUNTER — Telehealth: Payer: Self-pay | Admitting: Neurology

## 2019-02-20 NOTE — Telephone Encounter (Signed)
pts wife called in and stated she would like to know if he can come in sometime next week after 4p for injections  Cb# [phone]

## 2019-02-24 NOTE — Telephone Encounter (Signed)
Tried to call the pt's wife back but received no answer. We do not have any 4 pm availability this week. May offer pt's wife 9:30 AM tomorrow morning 8/11 arrival 09:00.

## 2019-02-24 NOTE — Telephone Encounter (Signed)
Spoke with pt's wife. Unable to take the 9:30 AM tomorrow. Pt scheduled for 3 pm on Thurs 8/13, arrival 2:45 pm. She verbalized appreciation.

## 2019-02-27 ENCOUNTER — Encounter: Payer: Self-pay | Admitting: Neurology

## 2019-02-27 ENCOUNTER — Ambulatory Visit (INDEPENDENT_AMBULATORY_CARE_PROVIDER_SITE_OTHER): Payer: Medicare PPO | Admitting: Neurology

## 2019-02-27 ENCOUNTER — Other Ambulatory Visit: Payer: Self-pay

## 2019-02-27 VITALS — BP 118/76 | HR 72 | Temp 98.2°F

## 2019-02-27 DIAGNOSIS — R51 Headache: Secondary | ICD-10-CM | POA: Diagnosis not present

## 2019-02-27 DIAGNOSIS — R519 Headache, unspecified: Secondary | ICD-10-CM

## 2019-02-27 DIAGNOSIS — G43001 Migraine without aura, not intractable, with status migrainosus: Secondary | ICD-10-CM | POA: Diagnosis not present

## 2019-02-27 NOTE — Progress Notes (Signed)
I think his headache was started after RFA of his occipital nerves but now his headaches are based more frontal in the temples especilly on the right. He has great response to nerve blocks however he is back in a week and we need something more long term since nerve blocks don;t appear to be breaking this cycle. Gabapentin increase did not help, unclear.  Update: I increased his gabapentin He has had one injection of CGRP and has several samples at home Can increase gabapentin or change to Lyrica or Depakote Today going to infuison for depacon 1g to see if it helps and Depakote also an option outpatient Botox for migraine is an option  He is on Cymbalta and Wellbutrin Never tried Topiramate A beta blocker may be an option as well He does have a history of migraines, is this an atypical migraine? He has diabetes, will not give steroids  Performed by Dr. Jaynee Eagles M.D. All procedures a documented were medically necessary, reasonable and appropriate based on the patient's history, medical diagnosis and physician opinion. Verbal informed consent was obtained from the patient, patient was informed of potential risk of procedure, including bruising, bleeding, hematoma formation, infection, muscle weakness, muscle pain, numbness, transient hypertension, transient hyperglycemia and transient insomnia among others. All areas injected were topically clean with isopropyl rubbing alcohol. Nonsterile nonlatex gloves were worn during the procedure.  1. Supraorbital nerve block (64400): Supraorbital nerve site was identified along the incision of the frontal bone on the orbital/supraorbital ridge. Medication was injected into the left and right supraorbital nerve areas. Patient's condition is associated with inflammation of the supraorbital and associated muscle groups. Injection was deemed medically necessary, reasonable and appropriate. Injection represents a separate and unique surgical service.   A total of 15  minutes was spent face-to-face with this patient. Over half this time was spent on counseling patient on the  1. Supraorbital headache   2. Temporal headache    diagnosis and different diagnostic and therapeutic options, counseling and coordination of care, risks ans benefits of management, compliance, or risk factor reduction and education.  This does not include time spent on nerve block procedure.

## 2019-02-27 NOTE — Progress Notes (Signed)
Nerve block w/o steroid: Pt signed consent  0.5% Bupivocaine 6 mL LOT: [id number] EXP: 02/2020 NDC: 66060-045-99  2% Lidocaine 6 mL LOT: [id number] EXP: 01/15/2020 NDC: 9532-0233-43

## 2019-03-03 DIAGNOSIS — R519 Headache, unspecified: Secondary | ICD-10-CM | POA: Insufficient documentation

## 2019-03-20 DIAGNOSIS — H2513 Age-related nuclear cataract, bilateral: Secondary | ICD-10-CM | POA: Diagnosis not present

## 2019-03-20 DIAGNOSIS — E1165 Type 2 diabetes mellitus with hyperglycemia: Secondary | ICD-10-CM | POA: Diagnosis not present

## 2019-03-26 ENCOUNTER — Telehealth: Payer: Self-pay | Admitting: Neurology

## 2019-03-26 NOTE — Telephone Encounter (Signed)
Pt wife is asking for a call from RN to discuss pt's temporal headaches and coming in for another injection

## 2019-03-27 ENCOUNTER — Other Ambulatory Visit: Payer: Self-pay | Admitting: *Deleted

## 2019-03-27 DIAGNOSIS — G4452 New daily persistent headache (NDPH): Secondary | ICD-10-CM

## 2019-03-27 MED ORDER — GABAPENTIN 300 MG PO CAPS: 600.0000 mg | ORAL_CAPSULE | Freq: Three times a day (TID) | ORAL | 1 refills | Status: DC

## 2019-03-27 NOTE — Telephone Encounter (Signed)
Received request for Gabapentin prescription through [facility]. Dr. Jaynee Eagles approved this. I have a pending Gabapentin prescription refill that will be sent to Dr. Jaynee Eagles for e-scribe and I spoke with [facility] @ [facility] in [place] and canceled the Gabapentin.

## 2019-03-27 NOTE — Telephone Encounter (Signed)
Dr. Jaynee Eagles is going to reach out to the pt over mychart.

## 2019-03-29 ENCOUNTER — Other Ambulatory Visit: Payer: Self-pay | Admitting: Neurology

## 2019-03-29 DIAGNOSIS — G4452 New daily persistent headache (NDPH): Secondary | ICD-10-CM

## 2019-03-29 MED ORDER — GABAPENTIN 300 MG PO CAPS: 600.0000 mg | ORAL_CAPSULE | Freq: Three times a day (TID) | ORAL | 4 refills | Status: AC

## 2019-03-31 ENCOUNTER — Telehealth: Payer: Self-pay | Admitting: Neurology

## 2019-03-31 ENCOUNTER — Other Ambulatory Visit: Payer: Self-pay | Admitting: Neurology

## 2019-03-31 MED ORDER — DIVALPROEX SODIUM ER 500 MG PO TB24: ORAL_TABLET | ORAL | 11 refills | Status: DC

## 2019-03-31 NOTE — Telephone Encounter (Signed)
Please add patient on our schedule next Wednesday, the 23rd, at 4 p.m for appointment please. thanks

## 2019-04-01 NOTE — Telephone Encounter (Signed)
Done

## 2019-04-02 ENCOUNTER — Telehealth: Payer: Self-pay | Admitting: *Deleted

## 2019-04-02 NOTE — Telephone Encounter (Signed)
Received a duplicate Gabapentin 110 mg Rx request from [facility]. The prescription was sent by Dr. Jaynee Eagles 03/27/2019. I called [employer] and spoke with Nira Conn, pharmacy tech. I confirmed they had received the prescription. No further action needed.

## 2019-04-09 ENCOUNTER — Ambulatory Visit (INDEPENDENT_AMBULATORY_CARE_PROVIDER_SITE_OTHER): Payer: Medicare PPO | Admitting: Neurology

## 2019-04-09 ENCOUNTER — Other Ambulatory Visit: Payer: Self-pay

## 2019-04-09 VITALS — BP 141/85 | HR 72 | Temp 98.0°F

## 2019-04-09 DIAGNOSIS — G43001 Migraine without aura, not intractable, with status migrainosus: Secondary | ICD-10-CM | POA: Diagnosis not present

## 2019-04-09 DIAGNOSIS — G43709 Chronic migraine without aura, not intractable, without status migrainosus: Secondary | ICD-10-CM | POA: Diagnosis not present

## 2019-04-09 NOTE — Progress Notes (Signed)
Nerve block w/o steroid: Pt signed consent  0.5% Bupivocaine 6 mL LOT: [id number] EXP: 02/2020 NDC: 97530-051-10  2% Lidocaine 6 mL LOT: [id number] EXP: 01/15/2020 NDC: 2111-7356-70  Order written for Depacon 1 gram IV x 1 per Dr. Jaynee Eagles. Orders signed and given to [facility].

## 2019-04-09 NOTE — Patient Instructions (Signed)

## 2019-04-09 NOTE — Progress Notes (Signed)
Trying botox, discussed ajovy*, increase depakote PO, infusion today for migraine Started   Performed by Dr. Jaynee Eagles M.D. . All procedures a documented blood were medically necessary, reasonable and appropriate based on the patient's history, medical diagnosis and physician opinion. Verbal informed consent was obtained from the patient, patient was informed of potential risk of procedure, including bruising, bleeding, hematoma formation, infection, muscle weakness, muscle pain, numbness, transient hypertension, transient hyperglycemia and transient insomnia among others. All areas injected were topically clean with isopropyl rubbing alcohol. Nonsterile nonlatex gloves were worn during the procedure.   Today pain went from 7/10 to a 4/10, starting more aggressive management as above  Performed by Dr. Jaynee Eagles M.D.  All procedures a documented blood were medically necessary, reasonable and appropriate based on the patient's history, medical diagnosis and physician opinion. Verbal informed consent was obtained from the patient, patient was informed of potential risk of procedure, including bruising, bleeding, hematoma formation, infection, muscle weakness, muscle pain, numbness, transient hypertension, transient hyperglycemia and transient insomnia among others. All areas injected were topically clean with isopropyl rubbing alcohol. Nonsterile nonlatex gloves were worn during the procedure.  1. Greater occipital nerve block [phone]). The greater occipital nerve site was identified at the nuchal line medial to the occipital artery. Medication was injected into the left and right occipital nerve areas and suboccipital areas. Patient's condition is associated with inflammation of the greater occipital nerve and associated multiple groups. Injection was deemed medically necessary, reasonable and appropriate. Injection represents a separate and unique surgical service.  2. Supraorbital nerve block (64400):  Supraorbital nerve site was identified along the incision of the frontal bone on the orbital/supraorbital ridge. Medication was injected into the left and right supraorbital nerve areas. Patient's condition is associated with inflammation of the supraorbital and associated muscle groups. Injection was deemed medically necessary, reasonable and appropriate. Injection represents a separate and unique surgical service.

## 2019-04-10 ENCOUNTER — Other Ambulatory Visit: Payer: Self-pay

## 2019-04-10 ENCOUNTER — Ambulatory Visit (INDEPENDENT_AMBULATORY_CARE_PROVIDER_SITE_OTHER): Payer: Medicare PPO | Admitting: Neurology

## 2019-04-10 DIAGNOSIS — G43711 Chronic migraine without aura, intractable, with status migrainosus: Secondary | ICD-10-CM

## 2019-04-10 NOTE — Progress Notes (Signed)
Botox- 200 units x 1 vial Lot: [id number] Expiration: 05/2020  Bacteriostatic 0.9% Sodium Chloride- 28mL total Lot: [id number] Expiration: 04/17/2019 NDC: 2111-5520-80  Dx:G43.709 Sample

## 2019-04-12 DIAGNOSIS — G43711 Chronic migraine without aura, intractable, with status migrainosus: Secondary | ICD-10-CM | POA: Insufficient documentation

## 2019-04-12 NOTE — Progress Notes (Signed)
[facility]    Provider:  Dr Jaynee Eagles Requesting Provider: Manon Hilding, MD Primary Care Provider:  Manon Hilding, MD  CC:  Chronic migraines  Interval history: Patient seen early this year for onset of headaches in the setting of severe neck pain and radiofrequency ablation.  His headaches initially started with more pain in the left back of the head however for the last 6 months his headaches have been more on the right side behind the eye, severe, at least 20 days out of the month, can last 24 to 72 hours, and they have a pounding and pulsating quality, sound sensitivity especially [name], and the patient does report light sensitivity (feels better in a dark quiet room), he does report nausea, no medication overuse, no aura, no vomiting.  He has a remote history of migraines and I do think that his migraines have reoccurred, we have tried multiple treatment modalities including injection blocks, managing his current medications, started Depakote, discussed other medications such as Lyrica, he is already on Cymbalta, tizanidine, trazodone, gabapentin, he is tried metoprolol in the past.  Failed multiple medications.  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  We will start Botox for migraines.  Interval history 01/01/2019: Patient is here for follow up of headaches.  63 y.o. male here as requested by Manon Hilding, MD for headache. He has a PMHx of chronic pain, s/p spinal stimulator, anxiety, remote migraines, neck injury and chronic right arm weakness, HTN, HLD, DM, depression. Headaches since December acute onset one morning. I reviewed his Care Everywhere notes, he has sleep apnea per Ortho seen in 03/16/2014. Continous, still the middle of his eyes and radiates to his temples. He says he does not sleep. He dozes off and then wakes up. He is extremely tired during the day. He feels he forgets a lot of things.  Shrill noises will make it worse but not light sensitivity. He doesn't wake up feeling rested. Severe sleep apnea but he lost significant weight and no longer has it. I reviewed his Care Everywhere notes, he has sleep apnea per Ortho seen in 03/16/2014. His wife is here. Ess 0.   Personally reviewed images and agree with the following: Normal MRI brain  HPI:  LONEY DOMINGO is a 63 y.o. male here as requested by Manon Hilding, MD for headache. He has a PMHx of chronic pain, s/p spinal stimulator, anxiety, remote migraines, neck injury and chronic right arm weakness, HTN, HLD, DM, depression. Headaches since December acute onset, severe worst headache of his life. He has ESI injections but since the radioablation he has had a headache but did not coincide with the injection. Headaches started a month later. Headaches in January. No known inciting events. He woke up one morning and felt like somoene hit him with a hammer right in the middle behind the eyes. It is tender to touch. Continuous, never stops, it wakes him up at night, he wakes with it, worse with position and valsalva. No snoring, he has a spinal stimulator. He woke up with it and it never went away. He is in a lot of pain. Constant, continuous 8/10 pain. Very shrill sounds make it worse. Nothing makes it better. Not positional. It throbs. No light or sound sensitivity (except for above), no nausea, no vomiting, sensitive to the touch at the bridge of the nose and radiates to the eyebrows into the temples. He feels more  dizzy lately. He feels unbalanced even when sitting. No congestion no fever. Symmetric to the temples. Many years ago he had migraines, chocolate and a medication maybe triggered it and it was 1990 and nothing like this. Did not help him at all. No other focal neurologic deficits, associated symptoms, inciting events or modifiable factors.   Review of Systems: Patient complains of symptoms per HPI as well as the following  symptoms: headache. Pertinent negatives and positives per HPI. All others negative.   Social History   Socioeconomic History   Marital status: Married    Spouse name: Cayton Cuevas   Number of children: 2   Years of education: 12   Highest education level: Not on file  Occupational History   Occupation: disabled  Ecologist strain: Not on file   Food insecurity    Worry: Not on file    Inability: Not on file   Transportation needs    Medical: Not on file    Non-medical: Not on file  Tobacco Use   Smoking status: Former Smoker    Quit date: 1987    Years since quitting: 33.7   Smokeless tobacco: Never Used   Tobacco comment: 3-4 per day  Substance and Sexual Activity   Alcohol use: Yes    Comment: occasionally   Drug use: Never   Sexual activity: Not on file  Lifestyle   Physical activity    Days per week: Not on file    Minutes per session: Not on file   Stress: Not on file  Relationships   Social connections    Talks on phone: Not on file    Gets together: Not on file    Attends religious service: Not on file    Active member of club or organization: Not on file    Attends meetings of clubs or organizations: Not on file    Relationship status: Not on file   Intimate partner violence    Fear of current or ex partner: Not on file    Emotionally abused: Not on file    Physically abused: Not on file    Forced sexual activity: Not on file  Other Topics Concern   Not on file  Social History Narrative   Lives at home with his wife   Right handed   Caffeine: 1-2 daily    Family History  Problem Relation Age of Onset   Brain cancer Mother    Heart disease Father    Heart attack Father    Diabetes Maternal Grandfather    Cancer Paternal Grandmother     Past Medical History:  Diagnosis Date   Anxiety    Depression    Diabetes (HCC)    High cholesterol    Hypertension    Migraine    Nerve damage  1998   neck/right shoulder; machinery fell on pt at work; 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 Under care of Cyris Bakhit in [facility] for pain management since 2000   OSA (obstructive sleep apnea)     Patient Active Problem List   Diagnosis Date Noted   Chronic migraine without aura, with intractable migraine, so stated, with status migrainosus 04/12/2019   Chronic migraine w/o aura w/o status migrainosus, not intractable 04/09/2019   Supraorbital headache 03/03/2019   Temporal headache 03/03/2019   NDPH (new daily persistent headache) 11/28/2018    Past Surgical History:  Procedure Laterality Date   BACK SURGERY  1985   CHOLECYSTECTOMY  2010?   FEMUR SURGERY  1972   spinal stimulator implanted  2001   spinal stimulator replaced  2011   teeth removal  2020   2   tooth removal  2020    Current Outpatient Medications  Medication Sig Dispense Refill   buPROPion (WELLBUTRIN XL) 150 MG 24 hr tablet Take 150 mg by mouth 3 (three) times daily.     divalproex (DEPAKOTE ER) 500 MG 24 hr tablet Start with one pill at bedtime for 1 week then increase to 2 pills at bedtime. 30 tablet 11   DULoxetine (CYMBALTA) 20 MG capsule Take 20 mg by mouth daily.     gabapentin (NEURONTIN) 300 MG capsule Take 2 capsules (600 mg total) by mouth 3 (three) times daily. 540 capsule 4   glipiZIDE (GLUCOTROL XL) 10 MG 24 hr tablet Take 10 mg by mouth 2 (two) times daily.     oxyCODONE-acetaminophen (PERCOCET/ROXICET) 5-325 MG tablet Take 2 tablets by mouth 3 (three) times daily as needed.      tiZANidine (ZANAFLEX) 2 MG tablet Take 2 mg by mouth 2 (two) times a day.     trazodone (DESYREL) 300 MG tablet Take 300 mg by mouth at bedtime.     zolpidem (AMBIEN CR) 12.5 MG CR tablet Take 12.5 mg by mouth at bedtime.     No current facility-administered medications for this visit.     Allergies as of 04/10/2019 - Review Complete 04/09/2019  Allergen Reaction Noted   Aspirin   11/20/2018    Vitals: There were no vitals taken for this visit. Last Weight:  Wt Readings from Last 1 Encounters:  01/01/19 188 lb (85.3 kg)   Last Height:   Ht Readings from Last 1 Encounters:  01/01/19 6\' 2"  (1.88 m)     Physical exam: Exam: Gen: NAD, conversant, well nourised, well groomed                     CV: RRR, no MRG. No Carotid Bruits. No peripheral edema, warm, nontender Eyes: Conjunctivae clear without exudates or hemorrhage. Sweeling above the eyes, tenderness to palpation along the supraorbital ridge.  Neuro: Detailed Neurologic Exam  Speech:    Speech is normal; fluent and spontaneous with normal comprehension.  Cognition:    The patient is oriented to person, place, and time;     recent and remote memory intact;     language fluent;     normal attention, concentration,     fund of knowledge Cranial Nerves:    The pupils are equal, round, and reactive to light. The fundi are normal and spontaneous venous pulsations are present. Visual fields are full to finger confrontation. Extraocular movements are intact. Trigeminal sensation is intact and the muscles of mastication are normal. The face is symmetric. The palate elevates in the midline. Hearing intact. Voice is normal. Shoulder shrug is normal. The tongue has normal motion without fasciculations.   Coordination:    Normal finger to nose  Gait:    Good stride and arm swing  Motor Observation:    No asymmetry, no atrophy, and no involuntary movements noted. Tone:    Normal muscle tone.    Posture:    Posture is normal. normal erect    Strength: prox weakness right arm (chronic) otherwise strength is V/V in the upper and lower limbs.      Sensation: intact to LT     Reflex Exam:  DTR's:    Deep tendon reflexes in  the upper and lower extremities are hyporeflexic but symmetric bilaterally.   Toes:    The toes are downgoing bilaterally.   Clonus:    Clonus is absent.    Assessment/Plan:   63 y.o. male here as requested by Estanislado Pandy, MD for headache. He has a PMHx of chronic pain, s/p spinal stimulator, anxiety, remote migraines, neck injury and chronic right arm weakness, HTN, HLD, DM, depression. Headaches since December acute onset, frontal, continuous, intractable after RFA of the cervical spine.  He has a remote history of migraines and since I have started seeing him he has developed more migrainous qualities, pounding, pulsating, throbbing, nausea, light sensitivity, sound sensitivity, movement making it worse, I'm going to treat him for chronic migraines.  -Start Botox, today can use samples - continue Depakote and cymbalta and gabapentin  Consent Form Botulism Toxin Injection For Chronic Migraine    Reviewed orally with patient, additionally signature is on file:  Botulism toxin has been approved by the Federal drug administration for treatment of chronic migraine. Botulism toxin does not cure chronic migraine and it may not be effective in some patients.  The administration of botulism toxin is accomplished by injecting a small amount of toxin into the muscles of the neck and head. Dosage must be titrated for each individual. Any benefits resulting from botulism toxin tend to wear off after 3 months with a repeat injection required if benefit is to be maintained. Injections are usually done every 3-4 months with maximum effect peak achieved by about 2 or 3 weeks. Botulism toxin is expensive and you should be sure of what costs you will incur resulting from the injection.  The side effects of botulism toxin use for chronic migraine may include:   -Transient, and usually mild, facial weakness with facial injections  -Transient, and usually mild, head or neck weakness with head/neck injections  -Reduction or loss of forehead facial animation due to forehead muscle weakness  -Eyelid drooping  -Dry eye  -Pain at the site of injection or bruising at the site of  injection  -Double vision  -Potential unknown long term risks  Contraindications: You should not have Botox if you are pregnant, nursing, allergic to albumin, have an infection, skin condition, or muscle weakness at the site of the injection, or have myasthenia gravis, Lambert-Eaton syndrome, or ALS.  It is also possible that as with any injection, there may be an allergic reaction or no effect from the medication. Reduced effectiveness after repeated injections is sometimes seen and rarely infection at the injection site may occur. All care will be taken to prevent these side effects. If therapy is given over a long time, atrophy and wasting in the muscle injected may occur. Occasionally the patient's become refractory to treatment because they develop antibodies to the toxin. In this event, therapy needs to be modified.  I have read the above information and consent to the administration of botulism toxin.    BOTOX PROCEDURE NOTE FOR MIGRAINE HEADACHE    Contraindications and precautions discussed with patient(above). Aseptic procedure was observed and patient tolerated procedure. Procedure performed by Dr. Artemio Aly  The condition has existed for more than 6 months, and pt does not have a diagnosis of ALS, Myasthenia Gravis or Lambert-Eaton Syndrome.  Risks and benefits of injections discussed and pt agrees to proceed with the procedure.  Written consent obtained  These injections are medically necessary. Pt  receives good benefits from these injections. These injections do not cause sedations  or hallucinations which the oral therapies may cause.  Description of procedure:  The patient was placed in a sitting position. The standard protocol was used for Botox as follows, with 5 units of Botox injected at each site:   -Procerus muscle, midline injection  -Corrugator muscle, bilateral injection  -Frontalis muscle, bilateral injection, with 2 sites each side, medial injection was  performed in the upper one third of the frontalis muscle, in the region vertical from the medial inferior edge of the superior orbital rim. The lateral injection was again in the upper one third of the forehead vertically above the lateral limbus of the cornea, 1.5 cm lateral to the medial injection site.  -Temporalis muscle injection, 4 sites, bilaterally. The first injection was 3 cm above the tragus of the ear, second injection site was 1.5 cm to 3 cm up from the first injection site in line with the tragus of the ear. The third injection site was 1.5-3 cm forward between the first 2 injection sites. The fourth injection site was 1.5 cm posterior to the second injection site.   -Occipitalis muscle injection, 3 sites, bilaterally. The first injection was done one half way between the occipital protuberance and the tip of the mastoid process behind the ear. The second injection site was done lateral and superior to the first, 1 fingerbreadth from the first injection. The third injection site was 1 fingerbreadth superiorly and medially from the first injection site.  -Cervical paraspinal muscle injection, 2 sites, bilateral knee first injection site was 1 cm from the midline of the cervical spine, 3 cm inferior to the lower border of the occipital protuberance. The second injection site was 1.5 cm superiorly and laterally to the first injection site.  -Trapezius muscle injection was performed at 3 sites, bilaterally. The first injection site was in the upper trapezius muscle halfway between the inflection point of the neck, and the acromion. The second injection site was one half way between the acromion and the first injection site. The third injection was done between the first injection site and the inflection point of the neck.   Will return for repeat injection in 3 months.   200 units of Botox was used, any Botox not injected was wasted. The patient tolerated the procedure well, there were no  complications of the above procedure.   A total of 30 minutes was spent face-to-face with this patient. Over half this time was spent on counseling patient on the  1. Chronic migraine without aura, with intractable migraine, so stated, with status migrainosus    diagnosis and different diagnostic and therapeutic options, counseling and coordination of care, risks ans benefits of management, compliance, or risk factor reduction and education.    Cc: Sasser, Clarene Critchley, MD  Naomie Dean, MD  [facility] [address] [place], [place] [address]  Phone [phone] Fax [phone] .

## 2019-04-23 DIAGNOSIS — H2511 Age-related nuclear cataract, right eye: Secondary | ICD-10-CM | POA: Diagnosis not present

## 2019-04-23 DIAGNOSIS — H2512 Age-related nuclear cataract, left eye: Secondary | ICD-10-CM | POA: Diagnosis not present

## 2019-04-23 DIAGNOSIS — E113291 Type 2 diabetes mellitus with mild nonproliferative diabetic retinopathy without macular edema, right eye: Secondary | ICD-10-CM | POA: Diagnosis not present

## 2019-05-04 ENCOUNTER — Telehealth: Payer: Self-pay | Admitting: Neurology

## 2019-05-04 NOTE — Telephone Encounter (Signed)
Can we start the approval for botox? And schedule an appointment fo rhim 10 weeks from the last time (I used samples) thanks

## 2019-05-05 NOTE — Telephone Encounter (Signed)
Consent was previously signed by pt. I have a botox charge sheet ready for Dr. Cathren Laine signature.

## 2019-05-05 NOTE — Telephone Encounter (Signed)
Botox charge sheet signed and given to [facility].

## 2019-05-07 NOTE — Telephone Encounter (Signed)
I called to schedule the patient and left a message with his wife asking for him to return my call.

## 2019-05-16 DIAGNOSIS — E1165 Type 2 diabetes mellitus with hyperglycemia: Secondary | ICD-10-CM | POA: Diagnosis not present

## 2019-05-16 DIAGNOSIS — K219 Gastro-esophageal reflux disease without esophagitis: Secondary | ICD-10-CM | POA: Diagnosis not present

## 2019-06-02 DIAGNOSIS — H2511 Age-related nuclear cataract, right eye: Secondary | ICD-10-CM | POA: Diagnosis not present

## 2019-06-02 DIAGNOSIS — H25011 Cortical age-related cataract, right eye: Secondary | ICD-10-CM | POA: Diagnosis not present

## 2019-06-16 DIAGNOSIS — E782 Mixed hyperlipidemia: Secondary | ICD-10-CM | POA: Diagnosis not present

## 2019-06-16 DIAGNOSIS — I10 Essential (primary) hypertension: Secondary | ICD-10-CM | POA: Diagnosis not present

## 2019-06-17 DIAGNOSIS — H25012 Cortical age-related cataract, left eye: Secondary | ICD-10-CM | POA: Diagnosis not present

## 2019-06-17 DIAGNOSIS — H2512 Age-related nuclear cataract, left eye: Secondary | ICD-10-CM | POA: Diagnosis not present

## 2019-06-19 ENCOUNTER — Ambulatory Visit (INDEPENDENT_AMBULATORY_CARE_PROVIDER_SITE_OTHER): Payer: Medicare PPO | Admitting: Neurology

## 2019-06-19 ENCOUNTER — Other Ambulatory Visit: Payer: Self-pay

## 2019-06-19 VITALS — Temp 97.9°F

## 2019-06-19 DIAGNOSIS — G43711 Chronic migraine without aura, intractable, with status migrainosus: Secondary | ICD-10-CM | POA: Diagnosis not present

## 2019-06-19 NOTE — Progress Notes (Signed)
Botox- 100 units x 1 vial Lot: [id number] Expiration: 02/2022 NDC: 8110-3159-45  Bacteriostatic 0.9% Sodium Chloride- 9mL total Lot: [id number] Expiration: 10/16/2019 NDC: 4462-8638-17  Dx: R11.657 B/B

## 2019-06-19 NOTE — Progress Notes (Signed)
He did better for about 4 weeks and then he slowly started getting more headaches. After that the headaches returned. Still significant but improved 50% in severity more tolerable than before.   Consent Form Botulism Toxin Injection For Chronic Migraine    Reviewed orally with patient, additionally signature is on file:  Botulism toxin has been approved by the Federal drug administration for treatment of chronic migraine. Botulism toxin does not cure chronic migraine and it may not be effective in some patients.  The administration of botulism toxin is accomplished by injecting a small amount of toxin into the muscles of the neck and head. Dosage must be titrated for each individual. Any benefits resulting from botulism toxin tend to wear off after 3 months with a repeat injection required if benefit is to be maintained. Injections are usually done every 3-4 months with maximum effect peak achieved by about 2 or 3 weeks. Botulism toxin is expensive and you should be sure of what costs you will incur resulting from the injection.  The side effects of botulism toxin use for chronic migraine may include:   -Transient, and usually mild, facial weakness with facial injections  -Transient, and usually mild, head or neck weakness with head/neck injections  -Reduction or loss of forehead facial animation due to forehead muscle weakness  -Eyelid drooping  -Dry eye  -Pain at the site of injection or bruising at the site of injection  -Double vision  -Potential unknown long term risks  Contraindications: You should not have Botox if you are pregnant, nursing, allergic to albumin, have an infection, skin condition, or muscle weakness at the site of the injection, or have myasthenia gravis, Lambert-Eaton syndrome, or ALS.  It is also possible that as with any injection, there may be an allergic reaction or no effect from the medication. Reduced effectiveness after repeated injections is sometimes seen  and rarely infection at the injection site may occur. All care will be taken to prevent these side effects. If therapy is given over a long time, atrophy and wasting in the muscle injected may occur. Occasionally the patient's become refractory to treatment because they develop antibodies to the toxin. In this event, therapy needs to be modified.  I have read the above information and consent to the administration of botulism toxin.    BOTOX PROCEDURE NOTE FOR MIGRAINE HEADACHE    Contraindications and precautions discussed with patient(above). Aseptic procedure was observed and patient tolerated procedure. Procedure performed by Dr. Artemio Aly  The condition has existed for more than 6 months, and pt does not have a diagnosis of ALS, Myasthenia Gravis or Lambert-Eaton Syndrome.  Risks and benefits of injections discussed and pt agrees to proceed with the procedure.  Written consent obtained  These injections are medically necessary. Pt  receives good benefits from these injections. These injections do not cause sedations or hallucinations which the oral therapies may cause.  Description of procedure:  The patient was placed in a sitting position. The standard protocol was used for Botox as follows, with 5 units of Botox injected at each site:   -Procerus muscle, midline injection  -Corrugator muscle, bilateral injection  -Frontalis muscle, bilateral injection, with 2 sites each side, medial injection was performed in the upper one third of the frontalis muscle, in the region vertical from the medial inferior edge of the superior orbital rim. The lateral injection was again in the upper one third of the forehead vertically above the lateral limbus of the cornea, 1.5 cm  lateral to the medial injection site.  -Temporalis muscle injection, 4 sites, bilaterally. The first injection was 3 cm above the tragus of the ear, second injection site was 1.5 cm to 3 cm up from the first injection site  in line with the tragus of the ear. The third injection site was 1.5-3 cm forward between the first 2 injection sites. The fourth injection site was 1.5 cm posterior to the second injection site.   -Occipitalis muscle injection, 3 sites, bilaterally. The first injection was done one half way between the occipital protuberance and the tip of the mastoid process behind the ear. The second injection site was done lateral and superior to the first, 1 fingerbreadth from the first injection. The third injection site was 1 fingerbreadth superiorly and medially from the first injection site.  -Cervical paraspinal muscle injection, 2 sites, bilateral knee first injection site was 1 cm from the midline of the cervical spine, 3 cm inferior to the lower border of the occipital protuberance. The second injection site was 1.5 cm superiorly and laterally to the first injection site.  -Trapezius muscle injection was performed at 3 sites, bilaterally. The first injection site was in the upper trapezius muscle halfway between the inflection point of the neck, and the acromion. The second injection site was one half way between the acromion and the first injection site. The third injection was done between the first injection site and the inflection point of the neck.   Will return for repeat injection in 3 months.   200 units of Botox was used, any Botox not injected was wasted. The patient tolerated the procedure well, there were no complications of the above procedure.

## 2019-06-24 ENCOUNTER — Other Ambulatory Visit: Payer: Self-pay

## 2019-06-24 ENCOUNTER — Ambulatory Visit (INDEPENDENT_AMBULATORY_CARE_PROVIDER_SITE_OTHER): Payer: Medicare PPO | Admitting: Neurology

## 2019-06-24 VITALS — Temp 97.6°F

## 2019-06-24 DIAGNOSIS — Z23 Encounter for immunization: Secondary | ICD-10-CM | POA: Diagnosis not present

## 2019-06-24 DIAGNOSIS — N400 Enlarged prostate without lower urinary tract symptoms: Secondary | ICD-10-CM | POA: Diagnosis not present

## 2019-06-24 DIAGNOSIS — E1165 Type 2 diabetes mellitus with hyperglycemia: Secondary | ICD-10-CM | POA: Diagnosis not present

## 2019-06-24 DIAGNOSIS — M542 Cervicalgia: Secondary | ICD-10-CM | POA: Diagnosis not present

## 2019-06-24 DIAGNOSIS — R350 Frequency of micturition: Secondary | ICD-10-CM | POA: Diagnosis not present

## 2019-06-24 DIAGNOSIS — G43711 Chronic migraine without aura, intractable, with status migrainosus: Secondary | ICD-10-CM | POA: Diagnosis not present

## 2019-06-24 DIAGNOSIS — R519 Headache, unspecified: Secondary | ICD-10-CM | POA: Diagnosis not present

## 2019-06-24 DIAGNOSIS — Z6824 Body mass index (BMI) 24.0-24.9, adult: Secondary | ICD-10-CM | POA: Diagnosis not present

## 2019-06-24 DIAGNOSIS — F5101 Primary insomnia: Secondary | ICD-10-CM | POA: Diagnosis not present

## 2019-06-24 DIAGNOSIS — Z6825 Body mass index (BMI) 25.0-25.9, adult: Secondary | ICD-10-CM | POA: Diagnosis not present

## 2019-06-24 DIAGNOSIS — E782 Mixed hyperlipidemia: Secondary | ICD-10-CM | POA: Diagnosis not present

## 2019-06-24 DIAGNOSIS — E1140 Type 2 diabetes mellitus with diabetic neuropathy, unspecified: Secondary | ICD-10-CM | POA: Diagnosis not present

## 2019-06-24 NOTE — Progress Notes (Signed)
Performed by Dr. Jaynee Eagles M.D. . All procedures a documented blood were medically necessary, reasonable and appropriate based on the patient's history, medical diagnosis and physician opinion. Verbal informed consent was obtained from the patient, patient was informed of potential risk of procedure, including bruising, bleeding, hematoma formation, infection, muscle weakness, muscle pain, numbness, transient hypertension, transient hyperglycemia and transient insomnia among others. All areas injected were topically clean with isopropyl rubbing alcohol. Nonsterile nonlatex gloves were worn during the procedure.   Today pain went from 6/10 to a 3/10, starting more aggressive management with botox and CGRPs as well as oral medications and gave Nurtec. He does well with these and we have had progress with the above treatments. Not needing as mnay nerve blocks, hopefully as we continue the ajovy and botox he will not be needing nerve blocks at all.   Performed by Dr. Jaynee Eagles M.D.  All procedures a documented blood were medically necessary, reasonable and appropriate based on the patient's history, medical diagnosis and physician opinion. Verbal informed consent was obtained from the patient, patient was informed of potential risk of procedure, including bruising, bleeding, hematoma formation, infection, muscle weakness, muscle pain, numbness, transient hypertension, transient hyperglycemia and transient insomnia among others. All areas injected were topically clean with isopropyl rubbing alcohol. Nonsterile nonlatex gloves were worn during the procedure.  1. Greater occipital nerve block [phone]). The greater occipital nerve site was identified at the nuchal line medial to the occipital artery. Medication was injected into the left and right occipital nerve areas and suboccipital areas. Patient's condition is associated with inflammation of the greater occipital nerve and associated multiple groups. Injection was deemed  medically necessary, reasonable and appropriate. Injection represents a separate and unique surgical service.  2. Supraorbital nerve block ([id number]): Supraorbital nerve site was identified along the incision of the frontal bone on the orbital/supraorbital ridge. Medication was injected into the left and right supraorbital nerve areas. Patient's condition is associated with inflammation of the supraorbital and associated muscle groups. Injection was deemed medically necessary, reasonable and appropriate. Injection represents a separate and unique surgical service.

## 2019-06-24 NOTE — Progress Notes (Signed)
Nerve block w/o steroid: Pt signed consent  0.5% Bupivocaine 9 mL LOT: [id number] EXP: 02/2020 NDC: 55150-250-50  2% Lidocaine 9 mL LOT: [id number] EXP: 01/15/2020 NDC: 0409-4277-16   

## 2019-07-17 DIAGNOSIS — G453 Amaurosis fugax: Secondary | ICD-10-CM | POA: Diagnosis not present

## 2019-07-17 DIAGNOSIS — G4489 Other headache syndrome: Secondary | ICD-10-CM | POA: Diagnosis not present

## 2019-07-18 DIAGNOSIS — S4990XA Unspecified injury of shoulder and upper arm, unspecified arm, initial encounter: Secondary | ICD-10-CM

## 2019-07-18 HISTORY — DX: Unspecified injury of shoulder and upper arm, unspecified arm, initial encounter: S49.90XA

## 2019-07-30 DIAGNOSIS — E1140 Type 2 diabetes mellitus with diabetic neuropathy, unspecified: Secondary | ICD-10-CM | POA: Diagnosis not present

## 2019-07-30 DIAGNOSIS — Z794 Long term (current) use of insulin: Secondary | ICD-10-CM | POA: Diagnosis not present

## 2019-07-30 DIAGNOSIS — F329 Major depressive disorder, single episode, unspecified: Secondary | ICD-10-CM | POA: Diagnosis not present

## 2019-07-30 DIAGNOSIS — H53131 Sudden visual loss, right eye: Secondary | ICD-10-CM | POA: Diagnosis not present

## 2019-07-30 DIAGNOSIS — M316 Other giant cell arteritis: Secondary | ICD-10-CM | POA: Diagnosis not present

## 2019-07-30 DIAGNOSIS — E785 Hyperlipidemia, unspecified: Secondary | ICD-10-CM | POA: Diagnosis not present

## 2019-07-30 DIAGNOSIS — G4733 Obstructive sleep apnea (adult) (pediatric): Secondary | ICD-10-CM | POA: Diagnosis not present

## 2019-07-30 DIAGNOSIS — R519 Headache, unspecified: Secondary | ICD-10-CM | POA: Diagnosis not present

## 2019-07-30 DIAGNOSIS — G4489 Other headache syndrome: Secondary | ICD-10-CM | POA: Diagnosis not present

## 2019-07-30 DIAGNOSIS — H547 Unspecified visual loss: Secondary | ICD-10-CM | POA: Diagnosis not present

## 2019-07-30 DIAGNOSIS — E1165 Type 2 diabetes mellitus with hyperglycemia: Secondary | ICD-10-CM | POA: Diagnosis not present

## 2019-07-30 DIAGNOSIS — Z87891 Personal history of nicotine dependence: Secondary | ICD-10-CM | POA: Diagnosis not present

## 2019-07-30 DIAGNOSIS — G43909 Migraine, unspecified, not intractable, without status migrainosus: Secondary | ICD-10-CM | POA: Diagnosis not present

## 2019-07-30 DIAGNOSIS — Z886 Allergy status to analgesic agent status: Secondary | ICD-10-CM | POA: Diagnosis not present

## 2019-07-30 DIAGNOSIS — I10 Essential (primary) hypertension: Secondary | ICD-10-CM | POA: Diagnosis not present

## 2019-07-30 DIAGNOSIS — H5461 Unqualified visual loss, right eye, normal vision left eye: Secondary | ICD-10-CM | POA: Diagnosis not present

## 2019-07-31 DIAGNOSIS — E1165 Type 2 diabetes mellitus with hyperglycemia: Secondary | ICD-10-CM | POA: Diagnosis not present

## 2019-07-31 DIAGNOSIS — I10 Essential (primary) hypertension: Secondary | ICD-10-CM | POA: Diagnosis not present

## 2019-07-31 DIAGNOSIS — H5461 Unqualified visual loss, right eye, normal vision left eye: Secondary | ICD-10-CM | POA: Diagnosis not present

## 2019-07-31 DIAGNOSIS — H547 Unspecified visual loss: Secondary | ICD-10-CM | POA: Diagnosis not present

## 2019-07-31 DIAGNOSIS — R519 Headache, unspecified: Secondary | ICD-10-CM | POA: Diagnosis not present

## 2019-07-31 DIAGNOSIS — E785 Hyperlipidemia, unspecified: Secondary | ICD-10-CM | POA: Diagnosis not present

## 2019-08-01 DIAGNOSIS — H547 Unspecified visual loss: Secondary | ICD-10-CM | POA: Diagnosis not present

## 2019-08-01 DIAGNOSIS — Z794 Long term (current) use of insulin: Secondary | ICD-10-CM | POA: Diagnosis not present

## 2019-08-01 DIAGNOSIS — H5461 Unqualified visual loss, right eye, normal vision left eye: Secondary | ICD-10-CM | POA: Diagnosis not present

## 2019-08-01 DIAGNOSIS — I10 Essential (primary) hypertension: Secondary | ICD-10-CM | POA: Diagnosis not present

## 2019-08-01 DIAGNOSIS — E1165 Type 2 diabetes mellitus with hyperglycemia: Secondary | ICD-10-CM | POA: Diagnosis not present

## 2019-08-01 DIAGNOSIS — E785 Hyperlipidemia, unspecified: Secondary | ICD-10-CM | POA: Diagnosis not present

## 2019-08-01 DIAGNOSIS — R519 Headache, unspecified: Secondary | ICD-10-CM | POA: Diagnosis not present

## 2019-08-01 DIAGNOSIS — G4733 Obstructive sleep apnea (adult) (pediatric): Secondary | ICD-10-CM | POA: Diagnosis not present

## 2019-08-02 DIAGNOSIS — R519 Headache, unspecified: Secondary | ICD-10-CM | POA: Diagnosis not present

## 2019-08-02 DIAGNOSIS — H547 Unspecified visual loss: Secondary | ICD-10-CM | POA: Diagnosis not present

## 2019-08-02 DIAGNOSIS — E1165 Type 2 diabetes mellitus with hyperglycemia: Secondary | ICD-10-CM | POA: Diagnosis not present

## 2019-08-02 DIAGNOSIS — Z794 Long term (current) use of insulin: Secondary | ICD-10-CM | POA: Diagnosis not present

## 2019-08-05 DIAGNOSIS — M316 Other giant cell arteritis: Secondary | ICD-10-CM | POA: Diagnosis not present

## 2019-08-05 DIAGNOSIS — H53133 Sudden visual loss, bilateral: Secondary | ICD-10-CM | POA: Diagnosis not present

## 2019-08-05 DIAGNOSIS — H35043 Retinal micro-aneurysms, unspecified, bilateral: Secondary | ICD-10-CM | POA: Diagnosis not present

## 2019-08-06 DIAGNOSIS — R519 Headache, unspecified: Secondary | ICD-10-CM | POA: Diagnosis not present

## 2019-08-06 DIAGNOSIS — Z87891 Personal history of nicotine dependence: Secondary | ICD-10-CM | POA: Diagnosis not present

## 2019-08-06 DIAGNOSIS — Z66 Do not resuscitate: Secondary | ICD-10-CM | POA: Diagnosis not present

## 2019-08-06 DIAGNOSIS — G43709 Chronic migraine without aura, not intractable, without status migrainosus: Secondary | ICD-10-CM | POA: Diagnosis not present

## 2019-08-06 DIAGNOSIS — G8929 Other chronic pain: Secondary | ICD-10-CM | POA: Diagnosis not present

## 2019-08-06 DIAGNOSIS — Z794 Long term (current) use of insulin: Secondary | ICD-10-CM | POA: Diagnosis not present

## 2019-08-06 DIAGNOSIS — M316 Other giant cell arteritis: Secondary | ICD-10-CM | POA: Diagnosis not present

## 2019-08-06 DIAGNOSIS — M542 Cervicalgia: Secondary | ICD-10-CM | POA: Diagnosis not present

## 2019-08-06 DIAGNOSIS — H53133 Sudden visual loss, bilateral: Secondary | ICD-10-CM | POA: Diagnosis not present

## 2019-08-06 DIAGNOSIS — Z79899 Other long term (current) drug therapy: Secondary | ICD-10-CM | POA: Diagnosis not present

## 2019-08-06 DIAGNOSIS — H543 Unqualified visual loss, both eyes: Secondary | ICD-10-CM | POA: Diagnosis not present

## 2019-08-06 DIAGNOSIS — T85192A Other mechanical complication of implanted electronic neurostimulator (electrode) of spinal cord, initial encounter: Secondary | ICD-10-CM | POA: Diagnosis not present

## 2019-08-06 DIAGNOSIS — G4733 Obstructive sleep apnea (adult) (pediatric): Secondary | ICD-10-CM | POA: Diagnosis not present

## 2019-08-08 DIAGNOSIS — M316 Other giant cell arteritis: Secondary | ICD-10-CM | POA: Insufficient documentation

## 2019-08-08 DIAGNOSIS — R519 Headache, unspecified: Secondary | ICD-10-CM | POA: Diagnosis not present

## 2019-08-08 DIAGNOSIS — H53133 Sudden visual loss, bilateral: Secondary | ICD-10-CM | POA: Diagnosis not present

## 2019-08-11 DIAGNOSIS — M316 Other giant cell arteritis: Secondary | ICD-10-CM | POA: Diagnosis not present

## 2019-08-18 DIAGNOSIS — H53133 Sudden visual loss, bilateral: Secondary | ICD-10-CM | POA: Diagnosis not present

## 2019-08-18 DIAGNOSIS — H47013 Ischemic optic neuropathy, bilateral: Secondary | ICD-10-CM | POA: Diagnosis not present

## 2019-08-28 DIAGNOSIS — M316 Other giant cell arteritis: Secondary | ICD-10-CM | POA: Diagnosis not present

## 2019-08-28 DIAGNOSIS — H53451 Other localized visual field defect, right eye: Secondary | ICD-10-CM | POA: Insufficient documentation

## 2019-08-28 DIAGNOSIS — H47013 Ischemic optic neuropathy, bilateral: Secondary | ICD-10-CM | POA: Insufficient documentation

## 2019-08-28 DIAGNOSIS — Z79899 Other long term (current) drug therapy: Secondary | ICD-10-CM | POA: Diagnosis not present

## 2019-08-28 DIAGNOSIS — H35063 Retinal vasculitis, bilateral: Secondary | ICD-10-CM | POA: Diagnosis not present

## 2019-08-29 DIAGNOSIS — H47013 Ischemic optic neuropathy, bilateral: Secondary | ICD-10-CM | POA: Diagnosis not present

## 2019-08-29 DIAGNOSIS — H35063 Retinal vasculitis, bilateral: Secondary | ICD-10-CM | POA: Diagnosis not present

## 2019-08-29 DIAGNOSIS — H35713 Central serous chorioretinopathy, bilateral: Secondary | ICD-10-CM | POA: Diagnosis not present

## 2019-09-11 DIAGNOSIS — M5481 Occipital neuralgia: Secondary | ICD-10-CM | POA: Insufficient documentation

## 2019-09-11 DIAGNOSIS — H53451 Other localized visual field defect, right eye: Secondary | ICD-10-CM | POA: Diagnosis not present

## 2019-09-11 DIAGNOSIS — H35061 Retinal vasculitis, right eye: Secondary | ICD-10-CM | POA: Diagnosis not present

## 2019-09-25 DIAGNOSIS — Z79899 Other long term (current) drug therapy: Secondary | ICD-10-CM | POA: Diagnosis not present

## 2019-09-25 DIAGNOSIS — M316 Other giant cell arteritis: Secondary | ICD-10-CM | POA: Diagnosis not present

## 2019-10-07 NOTE — Progress Notes (Addendum)
[facility]    Provider:  Dr Lucia Gaskins Requesting Provider: Estanislado Pandy, MD Primary Care Provider:  Estanislado Pandy, MD  CC:  Chronic migraines  He had cataract removal and he was having problems with vision out of his right eye. He was admitted for high dose steroids. He was taking high dose steroids and his eyes got worse. They have been on steroids and his vision is not getting better. 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 The right eye is severely affected. His left eye has been affected as well. He hasn't had any migraines. It hurts right above the right eye radiates to the back of the right ear.   Perform nerve block today, botox tomorrow  Performed by Dr. Lucia Gaskins M.D. . All procedures a documented blood were medically necessary, reasonable and appropriate based on the patient's history, medical diagnosis and physician opinion. Verbal informed consent was obtained from the patient, patient was informed of potential risk of procedure, including bruising, bleeding, hematoma formation, infection, muscle weakness, muscle pain, numbness, transient hypertension, transient hyperglycemia and transient insomnia among others. All areas injected were topically clean with isopropyl rubbing alcohol. Nonsterile nonlatex gloves were worn during the procedure.   Today pain went from 7-8/10 to a 0/10, starting more aggressive management with botox again tomorrow  Performed by Dr. Lucia Gaskins M.D.  All procedures a documented blood were medically necessary, reasonable and appropriate based on the patient's history, medical diagnosis and physician opinion. Verbal informed consent was obtained from the patient, patient was informed of potential risk of procedure, including bruising, bleeding, hematoma formation, infection, muscle weakness, muscle pain, numbness, transient  hypertension, transient hyperglycemia and transient insomnia among others. All areas injected were topically clean with isopropyl rubbing alcohol. Nonsterile nonlatex gloves were worn during the procedure.  1. Greater occipital nerve block [phone]). The greater occipital nerve site was identified at the nuchal line medial to the occipital artery. Medication was injected into the right occipital nerve areas and suboccipital areas. Patient's condition is associated with inflammation of the greater occipital nerve and associated multiple groups. Injection was deemed medically necessary, reasonable and appropriate. Injection represents a separate and unique surgical service.  2. Supraorbital nerve block ([id number]): Supraorbital nerve site was identified along the incision of the frontal bone on the orbital/supraorbital ridge. Medication was injected into the right supraorbital nerve areas. Patient's condition is associated with inflammation of the supraorbital and associated muscle groups. Injection was deemed medically necessary, reasonable and appropriate. Injection represents a separate and unique surgical service.     Interval history: Patient seen early this year for onset of headaches in the setting of severe neck pain and radiofrequency ablation.  His headaches initially started with more pain in the left back of the head however for the last 6 months his headaches have been more on the right side behind the eye, severe, at least 20 days out of the month, can last 24 to 72 hours, and they have a pounding and pulsating quality, sound sensitivity especially [name], and the patient does report light sensitivity (feels better in a dark quiet room), he does report nausea, no medication overuse, no aura, no vomiting.  He has a remote history of migraines and I do think that his migraines have reoccurred, we have tried multiple treatment modalities including injection blocks, managing his current  medications, started Depakote, discussed other medications  such as Lyrica, he is already on Cymbalta, tizanidine, trazodone, gabapentin, he is tried metoprolol in the past.  Failed multiple medications.  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  We will start Botox for migraines.  Interval history 01/01/2019: Patient is here for follow up of headaches.  64 y.o. male here as requested by Manon Hilding, MD for headache. He has a PMHx of chronic pain, s/p spinal stimulator, anxiety, remote migraines, neck injury and chronic right arm weakness, HTN, HLD, DM, depression. Headaches since December acute onset one morning. I reviewed his Care Everywhere notes, he has sleep apnea per Ortho seen in 03/16/2014. Continous, still the middle of his eyes and radiates to his temples. He says he does not sleep. He dozes off and then wakes up. He is extremely tired during the day. He feels he forgets a lot of things. Shrill noises will make it worse but not light sensitivity. He doesn't wake up feeling rested. Severe sleep apnea but he lost significant weight and no longer has it. I reviewed his Care Everywhere notes, he has sleep apnea per Ortho seen in 03/16/2014. His wife is here. Ess 0.   Personally reviewed images and agree with the following: Normal MRI brain  HPI:  Kyle [name] is a 64 y.o. male here as requested by Manon Hilding, MD for headache. He has a PMHx of chronic pain, s/p spinal stimulator, anxiety, remote migraines, neck injury and chronic right arm weakness, HTN, HLD, DM, depression. Headaches since December acute onset, severe worst headache of his life. He has ESI injections but since the radioablation he has had a headache but did not coincide with the injection. Headaches started a month later. Headaches in January. No known inciting events. He woke up one morning and felt like somoene hit him with a hammer right in the middle behind the eyes.  It is tender to touch. Continuous, never stops, it wakes him up at night, he wakes with it, worse with position and valsalva. No snoring, he has a spinal stimulator. He woke up with it and it never went away. He is in a lot of pain. Constant, continuous 8/10 pain. Very shrill sounds make it worse. Nothing makes it better. Not positional. It throbs. No light or sound sensitivity (except for above), no nausea, no vomiting, sensitive to the touch at the bridge of the nose and radiates to the eyebrows into the temples. He feels more dizzy lately. He feels unbalanced even when sitting. No congestion no fever. Symmetric to the temples. Many years ago he had migraines, chocolate and a medication maybe triggered it and it was 1990 and nothing like this. Did not help him at all. No other focal neurologic deficits, associated symptoms, inciting events or modifiable factors.   Review of Systems: Patient complains of symptoms per HPI as well as the following symptoms: headache. Pertinent negatives and positives per HPI. All others negative.   Social History   Socioeconomic History  . Marital status: Married    Spouse name: Kayveon Lennartz  . Number of children: 2  . Years of education: 35  . Highest education level: Not on file  Occupational History  . Occupation: disabled  Tobacco Use  . Smoking status: Former Smoker    Quit date: 1987    Years since quitting: 34.2  . Smokeless tobacco: Never Used  . Tobacco comment: 3-4 per day  Substance and Sexual Activity  . Alcohol use:  Yes    Comment: occasionally  . Drug use: Never  . Sexual activity: Not on file  Other Topics Concern  . Not on file  Social History Narrative   Lives at home with his wife   Right handed   Caffeine: 1-2 daily   Social Determinants of Health   Financial Resource Strain:   . Difficulty of Paying Living Expenses:   Food Insecurity:   . Worried About Programme researcher, broadcasting/film/video in the Last Year:   . Barista in the Last  Year:   Transportation Needs:   . Freight forwarder (Medical):   [name] Lack of Transportation (Non-Medical):   Physical Activity:   . Days of Exercise per Week:   . Minutes of Exercise per Session:   Stress:   . Feeling of Stress :   Social Connections:   . Frequency of Communication with Friends and Family:   . Frequency of Social Gatherings with Friends and Family:   . Attends Religious Services:   . Active Member of Clubs or Organizations:   . Attends Banker Meetings:   [name] Marital Status:   Intimate Partner Violence:   . Fear of Current or Ex-Partner:   . Emotionally Abused:   [name] Physically Abused:   . Sexually Abused:     Family History  Problem Relation Age of Onset  . Brain cancer Mother   . Heart disease Father   . Heart attack Father   . Diabetes Maternal Grandfather   . Cancer Paternal Grandmother     Past Medical History:  Diagnosis Date  . Anxiety   . Depression   . Diabetes (HCC)   . High cholesterol   . Hypertension   . Migraine   . 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 Under care of [facility] B[facility] in [place] for pain management since 2000  . OSA (obstructive sleep apnea)     Patient Active Problem List   Diagnosis Date Noted  . Chronic migraine without aura, with intractable migraine, so stated, with status migrainosus 04/12/2019  . Chronic migraine w/o aura w/o status migrainosus, not intractable 04/09/2019  . 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 Temporal headache 03/03/2019  . NDPH (new daily persistent headache) 11/28/2018    Past Surgical History:  Procedure Laterality Date  . BACK SURGERY  1985  . CHOLECYSTECTOMY     2010?  [name] FEMUR SURGERY  1972  . spinal stimulator implanted  2001  . spinal stimulator replaced  2011  . teeth removal  2020   2  . tooth removal  2020    Current Outpatient Medications  Medication Sig Dispense Refill  . buPROPion  (WELLBUTRIN XL) 150 MG 24 hr tablet Take 150 mg by mouth 3 (three) times daily.    . divalproex (DEPAKOTE ER) 500 MG 24 hr tablet Start with one pill at bedtime for 1 week then increase to 2 pills at bedtime. 30 tablet 11  . DULoxetine (CYMBALTA) 20 MG capsule Take 20 mg by mouth daily.    [name] gabapentin (NEURONTIN) 300 MG capsule Take 2 capsules (600 mg total) by mouth 3 (three) times daily. 540 capsule 4  . glipiZIDE (GLUCOTROL XL) 10 MG 24 hr tablet Take 10 mg by mouth 2 (two) times daily.    [name] oxyCODONE-acetaminophen (PERCOCET/ROXICET) 5-325 MG tablet Take 2 tablets by mouth 3 (three) times daily as needed.     [name]  tiZANidine (ZANAFLEX) 2 MG tablet Take 2 mg by mouth 2 (two) times a day.    . trazodone (DESYREL) 300 MG tablet Take 300 mg by mouth at bedtime.    [name] zolpidem (AMBIEN CR) 12.5 MG CR tablet Take 12.5 mg by mouth at bedtime.     No current facility-administered medications for this visit.    Allergies as of 10/08/2019 - Review Complete 06/24/2019  Allergen Reaction Noted  . Aspirin  11/20/2018    Vitals: There were no vitals taken for this visit. Last Weight:  Wt Readings from Last 1 Encounters:  01/01/19 188 lb (85.3 kg)   Last Height:   Ht Readings from Last 1 Encounters:  01/01/19 6\' 2"  (1.88 m)     Physical exam: Exam: Gen: NAD, conversant, well nourised, well groomed                     CV: RRR, no MRG. No Carotid Bruits. No peripheral edema, warm, nontender Eyes: Conjunctivae clear without exudates or hemorrhage. Sweeling above the eyes, tenderness to palpation along the supraorbital ridge.  Neuro: Detailed Neurologic Exam  Speech:    Speech is normal; fluent and spontaneous with normal comprehension.  Cognition:    The patient is oriented to person, place, and time;     recent and remote memory intact;     language fluent;     normal attention, concentration,     fund of knowledge Cranial Nerves:    The pupils are equal, round, and reactive to light.  The fundi are normal and spontaneous venous pulsations are present. Visual fields are full to finger confrontation. Extraocular movements are intact. Trigeminal sensation is intact and the muscles of mastication are normal. The face is symmetric. The palate elevates in the midline. Hearing intact. Voice is normal. Shoulder shrug is normal. The tongue has normal motion without fasciculations.   Coordination:    Normal finger to nose  Gait:    Good stride and arm swing  Motor Observation:    No asymmetry, no atrophy, and no involuntary movements noted. Tone:    Normal muscle tone.    Posture:    Posture is normal. normal erect    Strength: prox weakness right arm (chronic) otherwise strength is V/V in the upper and lower limbs.      Sensation: intact to LT     Reflex Exam:  DTR's:    Deep tendon reflexes in the upper and lower extremities are hyporeflexic but symmetric bilaterally.   Toes:    The toes are downgoing bilaterally.   Clonus:    Clonus is absent.    Assessment/Plan:  64 y.o. male here as requested by 76, MD for headache. He has a PMHx of chronic pain, s/p spinal stimulator, anxiety, remote migraines, neck injury and chronic right arm weakness, HTN, HLD, DM, depression. Headaches since December acute onset, frontal, continuous, intractable after RFA of the cervical spine.  He has a remote history of migraines and since I have started seeing him he has developed more migrainous qualities, pounding, pulsating, throbbing, nausea, light sensitivity, sound sensitivity, movement making it worse, I'm going to treat him for chronic migraines.  -Start Botox, tomorrow can use samples - continue Depakote and cymbalta and gabapentin  Cc: Sasser, January, MD  Clarene Critchley, MD  [facility] [address] [place], [place]  Phone [phone] Fax [phone] .

## 2019-10-08 ENCOUNTER — Other Ambulatory Visit: Payer: Self-pay

## 2019-10-08 ENCOUNTER — Ambulatory Visit: Payer: Medicare PPO | Admitting: Neurology

## 2019-10-08 VITALS — Temp 98.4°F

## 2019-10-08 DIAGNOSIS — G43001 Migraine without aura, not intractable, with status migrainosus: Secondary | ICD-10-CM | POA: Diagnosis not present

## 2019-10-08 DIAGNOSIS — G43711 Chronic migraine without aura, intractable, with status migrainosus: Secondary | ICD-10-CM

## 2019-10-08 NOTE — Progress Notes (Signed)
Nerve block w/o steroid: Pt signed consent  0.5% Bupivocaine 7.5 mL LOT: [id number] EXP: 05/2020 NDC: 50037-048-88  2% Lidocaine 7.5 mL LOT: [id number] EXP: 01/15/2020 NDC: 9169-4503-88  Depacon 1 gram IV x 1 order written per Dr. Lucia Gaskins for intrafusion to administer today while in office.

## 2019-10-09 ENCOUNTER — Ambulatory Visit: Payer: Medicare PPO | Admitting: Neurology

## 2019-10-09 ENCOUNTER — Other Ambulatory Visit: Payer: Self-pay

## 2019-10-09 VITALS — Temp 97.6°F

## 2019-10-09 DIAGNOSIS — G43711 Chronic migraine without aura, intractable, with status migrainosus: Secondary | ICD-10-CM

## 2019-10-09 NOTE — Progress Notes (Signed)
Botox- 200 units x 1 vial Lot: [id number] Expiration: 02/2022 NDC: 5110-2111-73  Bacteriostatic 0.9% Sodium Chloride- 57mL total Lot: [id number] Expiration: 10/16/2019 NDC: 1030-1314-38  Dx: G43.711 Samples used

## 2019-10-13 NOTE — Progress Notes (Signed)
Consent Form Botulism Toxin Injection For Chronic Migraine  USING SAMPLES AND CHARGING FOR OFFICE APPOINTMENT  Reviewed orally with patient, additionally signature is on file:  Botulism toxin has been approved by the Federal drug administration for treatment of chronic migraine. Botulism toxin does not cure chronic migraine and it may not be effective in some patients.  The administration of botulism toxin is accomplished by injecting a small amount of toxin into the muscles of the neck and head. Dosage must be titrated for each individual. Any benefits resulting from botulism toxin tend to wear off after 3 months with a repeat injection required if benefit is to be maintained. Injections are usually done every 3-4 months with maximum effect peak achieved by about 2 or 3 weeks. Botulism toxin is expensive and you should be sure of what costs you will incur resulting from the injection.  The side effects of botulism toxin use for chronic migraine may include:   -Transient, and usually mild, facial weakness with facial injections  -Transient, and usually mild, head or neck weakness with head/neck injections  -Reduction or loss of forehead facial animation due to forehead muscle weakness  -Eyelid drooping  -Dry eye  -Pain at the site of injection or bruising at the site of injection  -Double vision  -Potential unknown long term risks  Contraindications: You should not have Botox if you are pregnant, nursing, allergic to albumin, have an infection, skin condition, or muscle weakness at the site of the injection, or have myasthenia gravis, Lambert-Eaton syndrome, or ALS.  It is also possible that as with any injection, there may be an allergic reaction or no effect from the medication. Reduced effectiveness after repeated injections is sometimes seen and rarely infection at the injection site may occur. All care will be taken to prevent these side effects. If therapy is given over a long time,  atrophy and wasting in the muscle injected may occur. Occasionally the patient's become refractory to treatment because they develop antibodies to the toxin. In this event, therapy needs to be modified.  I have read the above information and consent to the administration of botulism toxin.    BOTOX PROCEDURE NOTE FOR MIGRAINE HEADACHE    Contraindications and precautions discussed with patient(above). Aseptic procedure was observed and patient tolerated procedure. Procedure performed by Dr. Artemio Aly  The condition has existed for more than 6 months, and pt does not have a diagnosis of ALS, Myasthenia Gravis or Lambert-Eaton Syndrome.  Risks and benefits of injections discussed and pt agrees to proceed with the procedure.  Written consent obtained  These injections are medically necessary. Pt  receives good benefits from these injections. These injections do not cause sedations or hallucinations which the oral therapies may cause.  Description of procedure:  The patient was placed in a sitting position. The standard protocol was used for Botox as follows, with 5 units of Botox injected at each site:   -Procerus muscle, midline injection  -Corrugator muscle, bilateral injection  -Frontalis muscle, bilateral injection, with 2 sites each side, medial injection was performed in the upper one third of the frontalis muscle, in the region vertical from the medial inferior edge of the superior orbital rim. The lateral injection was again in the upper one third of the forehead vertically above the lateral limbus of the cornea, 1.5 cm lateral to the medial injection site.  -Temporalis muscle injection, 4 sites, bilaterally. The first injection was 3 cm above the tragus of the ear, second injection  site was 1.5 cm to 3 cm up from the first injection site in line with the tragus of the ear. The third injection site was 1.5-3 cm forward between the first 2 injection sites. The fourth injection site  was 1.5 cm posterior to the second injection site.   -Occipitalis muscle injection, 3 sites, bilaterally. The first injection was done one half way between the occipital protuberance and the tip of the mastoid process behind the ear. The second injection site was done lateral and superior to the first, 1 fingerbreadth from the first injection. The third injection site was 1 fingerbreadth superiorly and medially from the first injection site.  -Cervical paraspinal muscle injection, 2 sites, bilateral knee first injection site was 1 cm from the midline of the cervical spine, 3 cm inferior to the lower border of the occipital protuberance. The second injection site was 1.5 cm superiorly and laterally to the first injection site.  -Trapezius muscle injection was performed at 3 sites, bilaterally. The first injection site was in the upper trapezius muscle halfway between the inflection point of the neck, and the acromion. The second injection site was one half way between the acromion and the first injection site. The third injection was done between the first injection site and the inflection point of the neck.   Will return for repeat injection in 3 months.   200 units of Botox was used, any Botox not injected was wasted. The patient tolerated the procedure well, there were no complications of the above procedure.   I spent 45 minutes of face-to-face and non-face-to-face time with patient on the  1. Chronic migraine without aura, with intractable migraine, so stated, with status migrainosus    diagnosis.  This included previsit chart review, lab review, study review, order entry, electronic health record documentation, patient education on the different diagnostic and therapeutic options, counseling and coordination of care, risks and benefits of management, compliance, or risk factor reduction

## 2019-10-16 DIAGNOSIS — H53451 Other localized visual field defect, right eye: Secondary | ICD-10-CM | POA: Diagnosis not present

## 2019-10-16 DIAGNOSIS — H35712 Central serous chorioretinopathy, left eye: Secondary | ICD-10-CM | POA: Diagnosis not present

## 2019-10-16 DIAGNOSIS — H35063 Retinal vasculitis, bilateral: Secondary | ICD-10-CM | POA: Diagnosis not present

## 2019-10-23 DIAGNOSIS — E1165 Type 2 diabetes mellitus with hyperglycemia: Secondary | ICD-10-CM | POA: Diagnosis not present

## 2019-10-23 DIAGNOSIS — E782 Mixed hyperlipidemia: Secondary | ICD-10-CM | POA: Diagnosis not present

## 2019-10-23 DIAGNOSIS — K2100 Gastro-esophageal reflux disease with esophagitis, without bleeding: Secondary | ICD-10-CM | POA: Diagnosis not present

## 2019-10-23 DIAGNOSIS — R5383 Other fatigue: Secondary | ICD-10-CM | POA: Diagnosis not present

## 2019-10-27 DIAGNOSIS — N400 Enlarged prostate without lower urinary tract symptoms: Secondary | ICD-10-CM | POA: Diagnosis not present

## 2019-10-27 DIAGNOSIS — E1140 Type 2 diabetes mellitus with diabetic neuropathy, unspecified: Secondary | ICD-10-CM | POA: Diagnosis not present

## 2019-10-27 DIAGNOSIS — H35069 Retinal vasculitis, unspecified eye: Secondary | ICD-10-CM | POA: Diagnosis not present

## 2019-10-27 DIAGNOSIS — Z6826 Body mass index (BMI) 26.0-26.9, adult: Secondary | ICD-10-CM | POA: Diagnosis not present

## 2019-10-27 DIAGNOSIS — E1165 Type 2 diabetes mellitus with hyperglycemia: Secondary | ICD-10-CM | POA: Diagnosis not present

## 2019-10-27 DIAGNOSIS — E782 Mixed hyperlipidemia: Secondary | ICD-10-CM | POA: Diagnosis not present

## 2019-10-27 DIAGNOSIS — R350 Frequency of micturition: Secondary | ICD-10-CM | POA: Diagnosis not present

## 2019-10-30 ENCOUNTER — Ambulatory Visit: Payer: Medicare PPO | Admitting: Neurology

## 2019-11-13 DIAGNOSIS — H47013 Ischemic optic neuropathy, bilateral: Secondary | ICD-10-CM | POA: Diagnosis not present

## 2019-11-13 DIAGNOSIS — H35063 Retinal vasculitis, bilateral: Secondary | ICD-10-CM | POA: Diagnosis not present

## 2019-12-02 DIAGNOSIS — R1313 Dysphagia, pharyngeal phase: Secondary | ICD-10-CM | POA: Diagnosis not present

## 2019-12-02 DIAGNOSIS — K2100 Gastro-esophageal reflux disease with esophagitis, without bleeding: Secondary | ICD-10-CM | POA: Diagnosis not present

## 2019-12-02 DIAGNOSIS — Z6825 Body mass index (BMI) 25.0-25.9, adult: Secondary | ICD-10-CM | POA: Diagnosis not present

## 2019-12-11 DIAGNOSIS — H47013 Ischemic optic neuropathy, bilateral: Secondary | ICD-10-CM | POA: Diagnosis not present

## 2019-12-11 DIAGNOSIS — H35063 Retinal vasculitis, bilateral: Secondary | ICD-10-CM | POA: Diagnosis not present

## 2020-01-13 ENCOUNTER — Ambulatory Visit (INDEPENDENT_AMBULATORY_CARE_PROVIDER_SITE_OTHER): Payer: Medicare PPO | Admitting: Neurology

## 2020-01-13 DIAGNOSIS — G43711 Chronic migraine without aura, intractable, with status migrainosus: Secondary | ICD-10-CM

## 2020-01-13 NOTE — Progress Notes (Signed)
Botox- 200 units x 1 vial Lot: [id number] Expiration: 09/2022 NDC: 0023-3921-02  Bacteriostatic 0.9% Sodium Chloride- 4mL total Lot: [id number] Expiration: 01/15/2020 NDC: 0409-1966-02  Dx: G43.711 B/B  

## 2020-01-13 NOTE — Progress Notes (Signed)
Consent Form Botulism Toxin Injection For Chronic Migraine  He is completely off of steroids. He is still having severe headaches and vision loss. The headaches posterior left occipital are better but continues to have the right-sided headaches where he had the biopsy. Discussed with wife and patient, if this botox doesn't improve his headaches (this will be our third) we will stop. The only thing that has worked is nerve blocks, have tried multiple options, will try one nerve block monthly.   Reviewed orally with patient, additionally signature is on file:  Botulism toxin has been approved by the Federal drug administration for treatment of chronic migraine. Botulism toxin does not cure chronic migraine and it may not be effective in some patients.  The administration of botulism toxin is accomplished by injecting a small amount of toxin into the muscles of the neck and head. Dosage must be titrated for each individual. Any benefits resulting from botulism toxin tend to wear off after 3 months with a repeat injection required if benefit is to be maintained. Injections are usually done every 3-4 months with maximum effect peak achieved by about 2 or 3 weeks. Botulism toxin is expensive and you should be sure of what costs you will incur resulting from the injection.  The side effects of botulism toxin use for chronic migraine may include:   -Transient, and usually mild, facial weakness with facial injections  -Transient, and usually mild, head or neck weakness with head/neck injections  -Reduction or loss of forehead facial animation due to forehead muscle weakness  -Eyelid drooping  -Dry eye  -Pain at the site of injection or bruising at the site of injection  -Double vision  -Potential unknown long term risks  Contraindications: You should not have Botox if you are pregnant, nursing, allergic to albumin, have an infection, skin condition, or muscle weakness at the site of the injection, or  have myasthenia gravis, Lambert-Eaton syndrome, or ALS.  It is also possible that as with any injection, there may be an allergic reaction or no effect from the medication. Reduced effectiveness after repeated injections is sometimes seen and rarely infection at the injection site may occur. All care will be taken to prevent these side effects. If therapy is given over a long time, atrophy and wasting in the muscle injected may occur. Occasionally the patient's become refractory to treatment because they develop antibodies to the toxin. In this event, therapy needs to be modified.  I have read the above information and consent to the administration of botulism toxin.    BOTOX PROCEDURE NOTE FOR MIGRAINE HEADACHE    Contraindications and precautions discussed with patient(above). Aseptic procedure was observed and patient tolerated procedure. Procedure performed by Dr. Artemio Aly  The condition has existed for more than 6 months, and pt does not have a diagnosis of ALS, Myasthenia Gravis or Lambert-Eaton Syndrome.  Risks and benefits of injections discussed and pt agrees to proceed with the procedure.  Written consent obtained  These injections are medically necessary. Pt  receives good benefits from these injections. These injections do not cause sedations or hallucinations which the oral therapies may cause.  Description of procedure:  The patient was placed in a sitting position. The standard protocol was used for Botox as follows, with 5 units of Botox injected at each site:   -Procerus muscle, midline injection  -Corrugator muscle, bilateral injection  -Frontalis muscle, bilateral injection, with 2 sites each side, medial injection was performed in the upper one third of  the frontalis muscle, in the region vertical from the medial inferior edge of the superior orbital rim. The lateral injection was again in the upper one third of the forehead vertically above the lateral limbus of the  cornea, 1.5 cm lateral to the medial injection site.  -Temporalis muscle injection, 4 sites, bilaterally. The first injection was 3 cm above the tragus of the ear, second injection site was 1.5 cm to 3 cm up from the first injection site in line with the tragus of the ear. The third injection site was 1.5-3 cm forward between the first 2 injection sites. The fourth injection site was 1.5 cm posterior to the second injection site.   -Occipitalis muscle injection, 3 sites, bilaterally. The first injection was done one half way between the occipital protuberance and the tip of the mastoid process behind the ear. The second injection site was done lateral and superior to the first, 1 fingerbreadth from the first injection. The third injection site was 1 fingerbreadth superiorly and medially from the first injection site.  -Cervical paraspinal muscle injection, 2 sites, bilateral knee first injection site was 1 cm from the midline of the cervical spine, 3 cm inferior to the lower border of the occipital protuberance. The second injection site was 1.5 cm superiorly and laterally to the first injection site.  -Trapezius muscle injection was performed at 3 sites, bilaterally. The first injection site was in the upper trapezius muscle halfway between the inflection point of the neck, and the acromion. The second injection site was one half way between the acromion and the first injection site. The third injection was done between the first injection site and the inflection point of the neck.   Will return for repeat injection in 3 months.   200 units of Botox was used, any Botox not injected was wasted. The patient tolerated the procedure well, there were no complications of the above procedure.   I spent 45 minutes of face-to-face and non-face-to-face time with patient on the  Consent Form Botulism Toxin Injection For Chronic Migraine  USING SAMPLES AND CHARGING FOR OFFICE APPOINTMENT  Reviewed orally  with patient, additionally signature is on file:  Botulism toxin has been approved by the Federal drug administration for treatment of chronic migraine. Botulism toxin does not cure chronic migraine and it may not be effective in some patients.  The administration of botulism toxin is accomplished by injecting a small amount of toxin into the muscles of the neck and head. Dosage must be titrated for each individual. Any benefits resulting from botulism toxin tend to wear off after 3 months with a repeat injection required if benefit is to be maintained. Injections are usually done every 3-4 months with maximum effect peak achieved by about 2 or 3 weeks. Botulism toxin is expensive and you should be sure of what costs you will incur resulting from the injection.  The side effects of botulism toxin use for chronic migraine may include:   -Transient, and usually mild, facial weakness with facial injections  -Transient, and usually mild, head or neck weakness with head/neck injections  -Reduction or loss of forehead facial animation due to forehead muscle weakness  -Eyelid drooping  -Dry eye  -Pain at the site of injection or bruising at the site of injection  -Double vision  -Potential unknown long term risks  Contraindications: You should not have Botox if you are pregnant, nursing, allergic to albumin, have an infection, skin condition, or muscle weakness at the site of  the injection, or have myasthenia gravis, Lambert-Eaton syndrome, or ALS.  It is also possible that as with any injection, there may be an allergic reaction or no effect from the medication. Reduced effectiveness after repeated injections is sometimes seen and rarely infection at the injection site may occur. All care will be taken to prevent these side effects. If therapy is given over a long time, atrophy and wasting in the muscle injected may occur. Occasionally the patient's become refractory to treatment because they develop  antibodies to the toxin. In this event, therapy needs to be modified.  I have read the above information and consent to the administration of botulism toxin.    BOTOX PROCEDURE NOTE FOR MIGRAINE HEADACHE    Contraindications and precautions discussed with patient(above). Aseptic procedure was observed and patient tolerated procedure. Procedure performed by Dr. Artemio Aly  The condition has existed for more than 6 months, and pt does not have a diagnosis of ALS, Myasthenia Gravis or Lambert-Eaton Syndrome.  Risks and benefits of injections discussed and pt agrees to proceed with the procedure.  Written consent obtained  These injections are medically necessary. Pt  receives good benefits from these injections. These injections do not cause sedations or hallucinations which the oral therapies may cause.  Description of procedure:  The patient was placed in a sitting position. The standard protocol was used for Botox as follows, with 5 units of Botox injected at each site:   -Procerus muscle, midline injection  -Corrugator muscle, bilateral injection  -Frontalis muscle, bilateral injection, with 2 sites each side, medial injection was performed in the upper one third of the frontalis muscle, in the region vertical from the medial inferior edge of the superior orbital rim. The lateral injection was again in the upper one third of the forehead vertically above the lateral limbus of the cornea, 1.5 cm lateral to the medial injection site.  -Temporalis muscle injection, 4 sites, bilaterally. The first injection was 3 cm above the tragus of the ear, second injection site was 1.5 cm to 3 cm up from the first injection site in line with the tragus of the ear. The third injection site was 1.5-3 cm forward between the first 2 injection sites. The fourth injection site was 1.5 cm posterior to the second injection site.   -Occipitalis muscle injection, 3 sites, bilaterally. The first injection was  done one half way between the occipital protuberance and the tip of the mastoid process behind the ear. The second injection site was done lateral and superior to the first, 1 fingerbreadth from the first injection. The third injection site was 1 fingerbreadth superiorly and medially from the first injection site.  -Cervical paraspinal muscle injection, 2 sites, bilateral knee first injection site was 1 cm from the midline of the cervical spine, 3 cm inferior to the lower border of the occipital protuberance. The second injection site was 1.5 cm superiorly and laterally to the first injection site.  -Trapezius muscle injection was performed at 3 sites, bilaterally. The first injection site was in the upper trapezius muscle halfway between the inflection point of the neck, and the acromion. The second injection site was one half way between the acromion and the first injection site. The third injection was done between the first injection site and the inflection point of the neck.   Will return for repeat injection in 3 months.   200 units of Botox was used, any Botox not injected was wasted. The patient tolerated the procedure well,  there were no complications of the above procedure.   I spent 45 minutes of face-to-face and non-face-to-face time with patient on the  No diagnosis found. diagnosis.  This included previsit chart review, lab review, study review, order entry, electronic health record documentation, patient education on the different diagnostic and therapeutic options, counseling and coordination of care, risks and benefits of management, compliance, or risk factor reduction

## 2020-01-15 DIAGNOSIS — H35063 Retinal vasculitis, bilateral: Secondary | ICD-10-CM | POA: Diagnosis not present

## 2020-01-20 ENCOUNTER — Other Ambulatory Visit: Payer: Self-pay

## 2020-01-20 ENCOUNTER — Ambulatory Visit (INDEPENDENT_AMBULATORY_CARE_PROVIDER_SITE_OTHER): Payer: Medicare PPO | Admitting: Neurology

## 2020-01-20 DIAGNOSIS — G44021 Chronic cluster headache, intractable: Secondary | ICD-10-CM

## 2020-01-20 DIAGNOSIS — G500 Trigeminal neuralgia: Secondary | ICD-10-CM

## 2020-01-20 DIAGNOSIS — R519 Headache, unspecified: Secondary | ICD-10-CM

## 2020-01-20 DIAGNOSIS — G43711 Chronic migraine without aura, intractable, with status migrainosus: Secondary | ICD-10-CM

## 2020-01-20 NOTE — Progress Notes (Addendum)
Nerve block w/o steroid: Pt signed consent  0.5% Bupivocaine 13.5 mL LOT: [id number] EXP: 03/17/2021 NDC: 4628-6381-77  2% Lidocaine 13.5 mL LOT: [id number] EXP: 06/16/2020 NDC: 1165-7903-83 &  LOT: [id number] EXP: 01/2021 NDC: 91660-600-45

## 2020-01-20 NOTE — Progress Notes (Signed)
Today pain went from 5/10 to a 0/10, tried multiple avenues, oral headache medicatons, CGRPs, botox , nothing helps like nerve blocks tried ubrelvy and nurtec - failed multiple procedures. May consider trying to find someone to perform neurolysis or ablation or other on the suprorbital nerve to see if this helps as his worst area is right temporal.    Performed by Dr. Lucia Gaskins M.D.  All procedures a documented blood were medically necessary, reasonable and appropriate based on the patient's history, medical diagnosis and physician opinion. Verbal informed consent was obtained from the patient, patient was informed of potential risk of procedure, including bruising, bleeding, hematoma formation, infection, muscle weakness, muscle pain, numbness, transient hypertension, transient hyperglycemia and transient insomnia among others. All areas injected were topically clean with isopropyl rubbing alcohol. Nonsterile nonlatex gloves were worn during the procedure. Will repeat in 3- weeks, depacon is in short supply at this time cannot provide migraine cocktail.   1. Greater occipital nerve block [phone]). The greater occipital nerve site was identified at the nuchal line medial to the occipital artery. Medication was injected into the left and right occipital nerve areas and suboccipital areas. Patient's condition is associated with inflammation of the greater occipital nerve and associated multiple groups. Injection was deemed medically necessary, reasonable and appropriate. Injection represents a separate and unique surgical service.  2. Supraorbital nerve block (64400): Supraorbital nerve site was identified along the incision of the frontal bone on the orbital/supraorbital ridge. Medication was injected into the left and right supraorbital nerve areas. Patient's condition is associated with inflammation of the supraorbital and associated muscle groups. Injection was deemed medically necessary, reasonable and  appropriate. Injection represents a separate and unique surgical service.

## 2020-02-05 DIAGNOSIS — H47013 Ischemic optic neuropathy, bilateral: Secondary | ICD-10-CM | POA: Diagnosis not present

## 2020-02-05 DIAGNOSIS — H53451 Other localized visual field defect, right eye: Secondary | ICD-10-CM | POA: Diagnosis not present

## 2020-02-11 NOTE — Progress Notes (Signed)
Today pain went from 8/10 to a 0/10, tried multiple avenues, oral headache medicatons, CGRPs, botox , nothing helps like nerve blocks tried ubrelvy and nurtec - failed multiple procedures. May consider trying to find someone to perform neurolysis or ablation or other on the suprorbital nerve to see if this helps as his worst area is right temporal.    Performed by Dr. Lucia Gaskins M.D.  All procedures a documented blood were medically necessary, reasonable and appropriate based on the patient's history, medical diagnosis and physician opinion. Verbal informed consent was obtained from the patient, patient was informed of potential risk of procedure, including bruising, bleeding, hematoma formation, infection, muscle weakness, muscle pain, numbness, transient hypertension, transient hyperglycemia and transient insomnia among others. All areas injected were topically clean with isopropyl rubbing alcohol. Nonsterile nonlatex gloves were worn during the procedure. Will repeat in 3- weeks, depacon is in short supply at this time cannot provide migraine cocktail.   1. Greater occipital nerve block [phone]). The greater occipital nerve site was identified at the nuchal line medial to the occipital artery. Medication was injected into the left and right occipital nerve areas and suboccipital areas. Patient's condition is associated with inflammation of the greater occipital nerve and associated multiple groups. Injection was deemed medically necessary, reasonable and appropriate. Injection represents a separate and unique surgical service.  2. Supraorbital nerve block (64400): Supraorbital nerve site was identified along the incision of the frontal bone on the orbital/supraorbital ridge. Medication was injected into the left and right supraorbital nerve areas. Patient's condition is associated with inflammation of the supraorbital and associated muscle groups. Injection was deemed medically necessary, reasonable and  appropriate. Injection represents a separate and unique surgical service.

## 2020-02-12 ENCOUNTER — Other Ambulatory Visit: Payer: Self-pay

## 2020-02-12 ENCOUNTER — Encounter: Payer: Self-pay | Admitting: Neurology

## 2020-02-12 ENCOUNTER — Ambulatory Visit (INDEPENDENT_AMBULATORY_CARE_PROVIDER_SITE_OTHER): Payer: Medicare PPO | Admitting: Neurology

## 2020-02-12 DIAGNOSIS — G8929 Other chronic pain: Secondary | ICD-10-CM | POA: Diagnosis not present

## 2020-02-12 DIAGNOSIS — R519 Headache, unspecified: Secondary | ICD-10-CM

## 2020-02-12 NOTE — Progress Notes (Addendum)
Nerve block w/o steroid: Pt signed consent  0.5% Bupivocaine 16.5 mL LOT: [id number] EXP: 07/2022 NDC: 62703-500-93  2% Lidocaine 16.5 mL LOT: [id number] EXP: 06/16/20 NDC: 8182-9937-16

## 2020-02-19 DIAGNOSIS — H35712 Central serous chorioretinopathy, left eye: Secondary | ICD-10-CM | POA: Diagnosis not present

## 2020-02-19 DIAGNOSIS — H47013 Ischemic optic neuropathy, bilateral: Secondary | ICD-10-CM | POA: Diagnosis not present

## 2020-02-19 DIAGNOSIS — H35063 Retinal vasculitis, bilateral: Secondary | ICD-10-CM | POA: Diagnosis not present

## 2020-03-09 DIAGNOSIS — E782 Mixed hyperlipidemia: Secondary | ICD-10-CM | POA: Diagnosis not present

## 2020-03-09 DIAGNOSIS — E1140 Type 2 diabetes mellitus with diabetic neuropathy, unspecified: Secondary | ICD-10-CM | POA: Diagnosis not present

## 2020-03-09 DIAGNOSIS — R5383 Other fatigue: Secondary | ICD-10-CM | POA: Diagnosis not present

## 2020-03-09 DIAGNOSIS — K2100 Gastro-esophageal reflux disease with esophagitis, without bleeding: Secondary | ICD-10-CM | POA: Diagnosis not present

## 2020-03-11 DIAGNOSIS — N400 Enlarged prostate without lower urinary tract symptoms: Secondary | ICD-10-CM | POA: Diagnosis not present

## 2020-03-11 DIAGNOSIS — E1165 Type 2 diabetes mellitus with hyperglycemia: Secondary | ICD-10-CM | POA: Diagnosis not present

## 2020-03-11 DIAGNOSIS — Z0001 Encounter for general adult medical examination with abnormal findings: Secondary | ICD-10-CM | POA: Diagnosis not present

## 2020-03-11 DIAGNOSIS — E782 Mixed hyperlipidemia: Secondary | ICD-10-CM | POA: Diagnosis not present

## 2020-03-11 DIAGNOSIS — Z6826 Body mass index (BMI) 26.0-26.9, adult: Secondary | ICD-10-CM | POA: Diagnosis not present

## 2020-03-11 DIAGNOSIS — M7022 Olecranon bursitis, left elbow: Secondary | ICD-10-CM | POA: Diagnosis not present

## 2020-03-11 DIAGNOSIS — R1313 Dysphagia, pharyngeal phase: Secondary | ICD-10-CM | POA: Diagnosis not present

## 2020-03-16 DIAGNOSIS — M7022 Olecranon bursitis, left elbow: Secondary | ICD-10-CM | POA: Diagnosis not present

## 2020-03-16 DIAGNOSIS — Z6826 Body mass index (BMI) 26.0-26.9, adult: Secondary | ICD-10-CM | POA: Diagnosis not present

## 2020-03-18 DIAGNOSIS — H40051 Ocular hypertension, right eye: Secondary | ICD-10-CM | POA: Diagnosis not present

## 2020-03-18 DIAGNOSIS — H35063 Retinal vasculitis, bilateral: Secondary | ICD-10-CM | POA: Diagnosis not present

## 2020-03-18 DIAGNOSIS — H40041 Steroid responder, right eye: Secondary | ICD-10-CM | POA: Diagnosis not present

## 2020-03-20 DIAGNOSIS — H40051 Ocular hypertension, right eye: Secondary | ICD-10-CM | POA: Insufficient documentation

## 2020-03-23 DIAGNOSIS — Z6826 Body mass index (BMI) 26.0-26.9, adult: Secondary | ICD-10-CM | POA: Diagnosis not present

## 2020-03-23 DIAGNOSIS — M7022 Olecranon bursitis, left elbow: Secondary | ICD-10-CM | POA: Diagnosis not present

## 2020-03-30 DIAGNOSIS — S42402D Unspecified fracture of lower end of left humerus, subsequent encounter for fracture with routine healing: Secondary | ICD-10-CM | POA: Diagnosis not present

## 2020-03-30 DIAGNOSIS — M7989 Other specified soft tissue disorders: Secondary | ICD-10-CM | POA: Diagnosis not present

## 2020-04-13 DIAGNOSIS — S46312D Strain of muscle, fascia and tendon of triceps, left arm, subsequent encounter: Secondary | ICD-10-CM | POA: Diagnosis not present

## 2020-04-15 DIAGNOSIS — S46312D Strain of muscle, fascia and tendon of triceps, left arm, subsequent encounter: Secondary | ICD-10-CM | POA: Diagnosis not present

## 2020-04-20 ENCOUNTER — Ambulatory Visit: Payer: Medicare PPO | Admitting: Neurology

## 2020-04-20 DIAGNOSIS — S46312D Strain of muscle, fascia and tendon of triceps, left arm, subsequent encounter: Secondary | ICD-10-CM | POA: Diagnosis not present

## 2020-04-22 DIAGNOSIS — H53451 Other localized visual field defect, right eye: Secondary | ICD-10-CM | POA: Diagnosis not present

## 2020-04-22 DIAGNOSIS — H35063 Retinal vasculitis, bilateral: Secondary | ICD-10-CM | POA: Diagnosis not present

## 2020-04-22 DIAGNOSIS — H47013 Ischemic optic neuropathy, bilateral: Secondary | ICD-10-CM | POA: Diagnosis not present

## 2020-04-26 ENCOUNTER — Encounter: Payer: Self-pay | Admitting: Neurology

## 2020-04-26 ENCOUNTER — Other Ambulatory Visit: Payer: Self-pay

## 2020-04-26 ENCOUNTER — Ambulatory Visit: Payer: Medicare PPO | Admitting: Neurology

## 2020-04-26 VITALS — BP 167/99 | HR 80

## 2020-04-26 DIAGNOSIS — Z1152 Encounter for screening for COVID-19: Secondary | ICD-10-CM | POA: Diagnosis not present

## 2020-04-26 DIAGNOSIS — M5481 Occipital neuralgia: Secondary | ICD-10-CM

## 2020-04-26 DIAGNOSIS — G8929 Other chronic pain: Secondary | ICD-10-CM

## 2020-04-26 DIAGNOSIS — R519 Headache, unspecified: Secondary | ICD-10-CM | POA: Insufficient documentation

## 2020-04-26 DIAGNOSIS — G500 Trigeminal neuralgia: Secondary | ICD-10-CM | POA: Insufficient documentation

## 2020-04-26 NOTE — Progress Notes (Signed)
Nerve block w/o steroid: Consent signed  0.5% Bupivocaine 4 mL LOT: [id number] EXP: 03/17/2021 NDC: 8101-7510-25  2% Lidocaine 4 mL LOT: [id number] EXP: 06/16/2020 NDC: 8527-7824-23

## 2020-04-26 NOTE — Progress Notes (Signed)
Today pain went from 6/10 to a 0/10, tried multiple avenues, oral headache medicatons, CGRPs, botox , nothing helps like nerve blocks tried ubrelvy and nurtec - failed multiple procedures. May consider trying to find someone to perform neurolysis or ablation or other on the suprorbital nerve to see if this helps as his worst area is right temporal.    Performed by Dr. Lucia Gaskins M.D.  All procedures a documented blood were medically necessary, reasonable and appropriate based on the patient's history, medical diagnosis and physician opinion. Verbal informed consent was obtained from the patient, patient was informed of potential risk of procedure, including bruising, bleeding, hematoma formation, infection, muscle weakness, muscle pain, numbness, transient hypertension, transient hyperglycemia and transient insomnia among others. All areas injected were topically clean with isopropyl rubbing alcohol. Nonsterile nonlatex gloves were worn during the procedure. Will repeat in 3- weeks, depacon is in short supply at this time cannot provide migraine cocktail.   1. Greater occipital nerve block [phone]). The greater occipital nerve site was identified at the nuchal line medial to the occipital artery. Medication was injected into the left occipital nerve areas and suboccipital areas. Patient's condition is associated with inflammation of the greater occipital nerve and associated multiple groups. Injection was deemed medically necessary, reasonable and appropriate. Injection represents a separate and unique surgical service.  2. Supraorbital nerve block ([id number]): Supraorbital nerve site was identified along the incision of the frontal bone on the orbital/supraorbital ridge. Medication was injected into the right supraorbital nerve areas. Patient's condition is associated with inflammation of the supraorbital and associated muscle groups. Injection was deemed medically necessary, reasonable and appropriate. Injection  represents a separate and unique surgical service.

## 2020-04-27 LAB — SARS-COV-2 ANTIBODY, IGM: SARS-CoV-2 Antibody, IgM: POSITIVE

## 2020-04-27 LAB — SAR COV2 SEROLOGY (COVID19)AB(IGG),IA: DiaSorin SARS-CoV-2 Ab, IgG: POSITIVE

## 2020-04-28 ENCOUNTER — Encounter (HOSPITAL_BASED_OUTPATIENT_CLINIC_OR_DEPARTMENT_OTHER): Payer: Self-pay | Admitting: Orthopaedic Surgery

## 2020-04-28 ENCOUNTER — Other Ambulatory Visit: Payer: Self-pay

## 2020-05-01 ENCOUNTER — Inpatient Hospital Stay (HOSPITAL_COMMUNITY): Admission: RE | Admit: 2020-05-01 | Payer: Medicare PPO | Source: Ambulatory Visit

## 2020-05-03 ENCOUNTER — Other Ambulatory Visit (HOSPITAL_COMMUNITY)
Admission: RE | Admit: 2020-05-03 | Discharge: 2020-05-03 | Disposition: A | Discharge status: Home / Self Care | Payer: Medicare PPO | Source: Ambulatory Visit | Attending: Orthopaedic Surgery | Admitting: Orthopaedic Surgery

## 2020-05-03 ENCOUNTER — Encounter (HOSPITAL_BASED_OUTPATIENT_CLINIC_OR_DEPARTMENT_OTHER)
Admission: RE | Admit: 2020-05-03 | Discharge: 2020-05-03 | Disposition: A | Discharge status: Home / Self Care | Payer: Medicare PPO | Source: Ambulatory Visit | Attending: Orthopaedic Surgery | Admitting: Orthopaedic Surgery

## 2020-05-03 DIAGNOSIS — Z01818 Encounter for other preprocedural examination: Secondary | ICD-10-CM | POA: Insufficient documentation

## 2020-05-03 DIAGNOSIS — Z20822 Contact with and (suspected) exposure to covid-19: Secondary | ICD-10-CM | POA: Diagnosis not present

## 2020-05-03 DIAGNOSIS — Z01812 Encounter for preprocedural laboratory examination: Secondary | ICD-10-CM | POA: Insufficient documentation

## 2020-05-03 LAB — BASIC METABOLIC PANEL
Anion gap: 9 (ref 5–15)
BUN: 12 mg/dL (ref 8–23)
CO2: 28 mmol/L (ref 22–32)
Calcium: 9.3 mg/dL (ref 8.9–10.3)
Chloride: 101 mmol/L (ref 98–111)
Creatinine, Ser: 1 mg/dL (ref 0.61–1.24)
GFR, Estimated: >60 mL/min (ref >60)
Glucose, Bld: 222 mg/dL — ABNORMAL HIGH (ref 70–99)
Potassium: 4.3 mmol/L (ref 3.5–5.1)
Sodium: 138 mmol/L (ref 135–145)

## 2020-05-03 LAB — SARS CORONAVIRUS 2 (TAT 6-24 HRS): SARS Coronavirus 2: NEGATIVE

## 2020-05-03 NOTE — Progress Notes (Signed)

## 2020-05-04 NOTE — H&P (Signed)
PREOPERATIVE H&P  Chief Complaint: LEFT UPPER ARM CONTRACTURE LESION OF ULNAR NERVE  HPI: Kyle [name] is a 64 y.o. male who is scheduled for LEFT REINSERTION RUPTURED BICEPS/TRIPCEPS TENDON DISTAL WITHOUT TENDON GRAFT, MANIPULATION ELBOW WITH ANESTHESIA NEUROPLASTY AND TRANSPOSITION, ULNAR NERVE AT ELBOW.   Patient has a past medical history significant for diabetes, HTN, chronic pain, right brachial plexopathy.   Kyle [name] is a 64 year-old who fell five weeks ago off a ladder injuring his left elbow.  He was seen at [facility].  He has been using ice and gentle range of motion.  His pain has not subsided.  He underwent a CT scan that showed no fracture.  He cannot undergo an MRI because he has an implanted spine stimulator.    His symptoms are rated as moderate to severe, and have been worsening.  This is significantly impairing activities of daily living.    Please see clinic note for further details on this patient's care.    He has elected for surgical management.   Past Medical History:  Diagnosis Date  . Anxiety   . Arm injury 2021   torn tendon and ligament in R arm; pending surgery   . Depression   . Diabetes (HCC)   . High cholesterol   . Migraine   . 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 Under care of [facility] in [place] for pain management since 2000  . OSA (obstructive sleep apnea)    does not use CPAP   Past Surgical History:  Procedure Laterality Date  . BACK SURGERY  1985  . CHOLECYSTECTOMY     2010?  [name] FEMUR SURGERY  1972  . spinal stimulator implanted  2001  . spinal stimulator replaced  2011  . teeth removal  2020   2  . tooth removal  2020   Social History   Socioeconomic History  . Marital status: Married    Spouse name: Kindred Reidinger  . Number of children: 2  . Years of education: 55  . Highest education level: Not on file  Occupational History    . Occupation: disabled  Tobacco Use  . Smoking status: Former Smoker    Quit date: 1987    Years since quitting: 34.8  . Smokeless tobacco: Never Used  . Tobacco comment: 3-4 per day  Vaping Use  . Vaping Use: Never used  Substance and Sexual Activity  . Alcohol use: Yes    Comment: occasionally  . Drug use: Never  . Sexual activity: Not on file  Other Topics Concern  . Not on file  Social History Narrative   Lives at home with his wife   Right handed   Caffeine: 1-2 daily   Social Determinants of Health   Financial Resource Strain:   . Difficulty of Paying Living Expenses: Not on file  Food Insecurity:   . Worried About Programme researcher, broadcasting/film/video in the Last Year: Not on file  . Ran Out of Food in the Last Year: Not on file  Transportation Needs:   . Lack of Transportation (Medical): Not on file  . Lack of Transportation (Non-Medical): Not on file  Physical Activity:   . Days of Exercise per Week: Not on file  . Minutes of Exercise per Session: Not on file  Stress:   . Feeling of Stress : Not on file  Social Connections:   .  Frequency of Communication with Friends and Family: Not on file  . Frequency of Social Gatherings with Friends and Family: Not on file  . Attends Religious Services: Not on file  . Active Member of Clubs or Organizations: Not on file  . Attends Banker Meetings: Not on file  . Marital Status: Not on file   Family History  Problem Relation Age of Onset  . Brain cancer Mother   . Heart disease Father   . Heart attack Father   . Diabetes Maternal Grandfather   . Cancer Paternal Grandmother    Allergies  Allergen Reactions  . Aspirin Anaphylaxis   Prior to Admission medications   Medication Sig Start Date End Date Taking? Authorizing Provider  ACTEMRA ACTPEN 162 MG/0.9ML SOAJ  08/12/19  Yes [provider]  buPROPion (WELLBUTRIN XL) 150 MG 24 hr tablet Take 150 mg by mouth 3 (three) times daily.   Yes [provider]  divalproex (DEPAKOTE ER) 500 MG 24 hr tablet Start with one pill at bedtime for 1 week then increase to 2 pills at bedtime. 03/31/19  Yes Anson Fret, MD  gabapentin (NEURONTIN) 300 MG capsule Take 2 capsules (600 mg total) by mouth 3 (three) times daily. 03/29/19  Yes Anson Fret, MD  glipiZIDE (GLUCOTROL XL) 10 MG 24 hr tablet Take 10 mg by mouth 2 (two) times daily. 07/16/18  Yes [provider]  insulin glargine (LANTUS) 100 UNIT/ML injection Inject 20 Units into the skin 2 (two) times daily.   Yes [provider]  oxyCODONE-acetaminophen (PERCOCET/ROXICET) 5-325 MG tablet Take 2 tablets by mouth 3 (three) times daily as needed.    Yes [provider]  tiZANidine (ZANAFLEX) 2 MG tablet Take 2 mg by mouth 2 (two) times a day.   Yes [provider]  trazodone (DESYREL) 300 MG tablet Take 300 mg by mouth at bedtime.   Yes [provider]  zolpidem (AMBIEN CR) 12.5 MG CR tablet Take 12.5 mg by mouth at bedtime.   Yes [provider]    ROS: All other systems have been reviewed and were otherwise negative with the exception of those mentioned in the HPI and as above.  Physical Exam: General: Alert, no acute distress Cardiovascular: No pedal edema Respiratory: No cyanosis, no use of accessory musculature GI: No organomegaly, abdomen is soft and non-tender Skin: No lesions in the area of chief complaint Neurologic: Sensation intact distally Psychiatric: Patient is competent for consent with normal mood and affect Lymphatic: No axillary or cervical lymphadenopathy  MUSCULOSKELETAL:  Left upper extremity: Patient has significantly limited elbow range of motion, 45-60.  He has exquisite pain with palpation around his posterior olecranon area and has pain with firing of his triceps.  He is not able to really actively extend well.  He has positive Tinel's and cubital tunnel, and numbness in his small and ring finger.  Range of motion  is extremely limited.  Imaging: Ultrasound demonstrates a possible full thickness triceps rupture.  Assessment: Triceps rupture, cubital tunnel, and elbow contracture.       Plan: Plan for Procedure(s): LEFT REINSERTION RUPTURED BICEPS/TRIPCEPS TENDON DISTAL WITHOUT TENDON GRAFT, MANIPULATION ELBOW WITH ANESTHESIA NEUROPLASTY AND TRANSPOSITION, ULNAR NERVE AT ELBOW  The patient has a complex issue.  At this point, he has had a remote injury to his triceps.  He does seem to have a retracted partial triceps rupture at the very least.  Dr. Everardo Pacific and the patient talked about primary  versus allograft repair and reconstruction of his triceps, cubital tunnel decompression and nerve transposition subfascial, and a manipulation versus lysis of adhesions.  He understands the risks, benefits and alternatives including heterotopic bone, stiffness, and the possibility of re-tear.    The risks benefits and alternatives were discussed with the patient including but not limited to the risks of nonoperative treatment, versus surgical intervention including infection, bleeding, nerve injury,  blood clots, cardiopulmonary complications, morbidity, mortality, among others, and they were willing to proceed.   The patient acknowledged the explanation, agreed to proceed with the plan and consent was signed.   Operative Plan: Left triceps repair versus reconstruction with allograft, cubital tunnel decompression and nerve transposition subfascial, and a manipulation versus lysis of adhesions Discharge Medications: Currently on Percocet 5-325mg  TID - will discuss with his pain management provider post-op narcotics, Toradol x 3 days -> Celebrex, Zofran DVT Prophylaxis: None Physical Therapy: Outpatient PT Special Discharge needs: Prudencio Burly, PA-C  05/04/2020 4:07 PM

## 2020-05-05 ENCOUNTER — Telehealth: Payer: Self-pay | Admitting: Infectious Disease

## 2020-05-05 ENCOUNTER — Ambulatory Visit (HOSPITAL_BASED_OUTPATIENT_CLINIC_OR_DEPARTMENT_OTHER): Payer: Medicare PPO | Admitting: Certified Registered Nurse Anesthetist

## 2020-05-05 ENCOUNTER — Other Ambulatory Visit: Payer: Self-pay

## 2020-05-05 ENCOUNTER — Ambulatory Visit (HOSPITAL_BASED_OUTPATIENT_CLINIC_OR_DEPARTMENT_OTHER)
Admission: RE | Admit: 2020-05-05 | Discharge: 2020-05-05 | Disposition: A | Discharge status: Home / Self Care | Payer: Medicare PPO | Attending: Orthopaedic Surgery | Admitting: Orthopaedic Surgery

## 2020-05-05 ENCOUNTER — Encounter (HOSPITAL_BASED_OUTPATIENT_CLINIC_OR_DEPARTMENT_OTHER): Payer: Self-pay | Admitting: Orthopaedic Surgery

## 2020-05-05 ENCOUNTER — Encounter (HOSPITAL_BASED_OUTPATIENT_CLINIC_OR_DEPARTMENT_OTHER)
Admission: RE | Disposition: A | Discharge status: Home / Self Care | Payer: Self-pay | Source: Home / Self Care | Attending: Orthopaedic Surgery

## 2020-05-05 DIAGNOSIS — E119 Type 2 diabetes mellitus without complications: Secondary | ICD-10-CM | POA: Diagnosis not present

## 2020-05-05 DIAGNOSIS — F419 Anxiety disorder, unspecified: Secondary | ICD-10-CM | POA: Diagnosis not present

## 2020-05-05 DIAGNOSIS — G4733 Obstructive sleep apnea (adult) (pediatric): Secondary | ICD-10-CM | POA: Diagnosis not present

## 2020-05-05 DIAGNOSIS — Z87891 Personal history of nicotine dependence: Secondary | ICD-10-CM | POA: Insufficient documentation

## 2020-05-05 DIAGNOSIS — I10 Essential (primary) hypertension: Secondary | ICD-10-CM | POA: Diagnosis not present

## 2020-05-05 DIAGNOSIS — M869 Osteomyelitis, unspecified: Secondary | ICD-10-CM | POA: Diagnosis not present

## 2020-05-05 DIAGNOSIS — Z886 Allergy status to analgesic agent status: Secondary | ICD-10-CM | POA: Diagnosis not present

## 2020-05-05 DIAGNOSIS — M7022 Olecranon bursitis, left elbow: Secondary | ICD-10-CM | POA: Insufficient documentation

## 2020-05-05 DIAGNOSIS — M10022 Idiopathic gout, left elbow: Secondary | ICD-10-CM | POA: Diagnosis not present

## 2020-05-05 DIAGNOSIS — G5622 Lesion of ulnar nerve, left upper limb: Secondary | ICD-10-CM | POA: Diagnosis not present

## 2020-05-05 DIAGNOSIS — G8918 Other acute postprocedural pain: Secondary | ICD-10-CM | POA: Diagnosis not present

## 2020-05-05 DIAGNOSIS — F32A Depression, unspecified: Secondary | ICD-10-CM | POA: Insufficient documentation

## 2020-05-05 DIAGNOSIS — Z794 Long term (current) use of insulin: Secondary | ICD-10-CM | POA: Diagnosis not present

## 2020-05-05 DIAGNOSIS — G8929 Other chronic pain: Secondary | ICD-10-CM | POA: Insufficient documentation

## 2020-05-05 DIAGNOSIS — W11XXXA Fall on and from ladder, initial encounter: Secondary | ICD-10-CM | POA: Diagnosis not present

## 2020-05-05 DIAGNOSIS — S5402XA Injury of ulnar nerve at forearm level, left arm, initial encounter: Secondary | ICD-10-CM | POA: Diagnosis not present

## 2020-05-05 DIAGNOSIS — Z79899 Other long term (current) drug therapy: Secondary | ICD-10-CM | POA: Insufficient documentation

## 2020-05-05 DIAGNOSIS — M8668 Other chronic osteomyelitis, other site: Secondary | ICD-10-CM | POA: Diagnosis not present

## 2020-05-05 DIAGNOSIS — S46302A Unspecified injury of muscle, fascia and tendon of triceps, left arm, initial encounter: Secondary | ICD-10-CM | POA: Diagnosis not present

## 2020-05-05 DIAGNOSIS — M86122 Other acute osteomyelitis, left humerus: Secondary | ICD-10-CM | POA: Diagnosis not present

## 2020-05-05 DIAGNOSIS — E1169 Type 2 diabetes mellitus with other specified complication: Secondary | ICD-10-CM | POA: Diagnosis not present

## 2020-05-05 HISTORY — PX: OLECRANON BURSECTOMY: SHX2097

## 2020-05-05 HISTORY — PX: ULNAR NERVE TRANSPOSITION: SHX2595

## 2020-05-05 LAB — GLUCOSE, CAPILLARY
Glucose-Capillary: 154 mg/dL — ABNORMAL HIGH (ref 70–99)
Glucose-Capillary: 158 mg/dL — ABNORMAL HIGH (ref 70–99)

## 2020-05-05 SURGERY — BURSECTOMY, ELBOW
Anesthesia: General | Site: Elbow | Laterality: Left

## 2020-05-05 MED ORDER — ONDANSETRON HCL 4 MG/2ML IJ SOLN
INTRAMUSCULAR | Status: DC | PRN
  Administered 2020-05-05: 4 mg via INTRAVENOUS

## 2020-05-05 MED ORDER — DEXAMETHASONE SODIUM PHOSPHATE 10 MG/ML IJ SOLN
INTRAMUSCULAR | Status: DC | PRN
  Administered 2020-05-05: 4 mg via INTRAVENOUS

## 2020-05-05 MED ORDER — LIDOCAINE 2% (20 MG/ML) 5 ML SYRINGE
INTRAMUSCULAR | Status: DC | PRN
  Administered 2020-05-05: 60 mg via INTRAVENOUS

## 2020-05-05 MED ORDER — CEFAZOLIN SODIUM-DEXTROSE 2-4 GM/100ML-% IV SOLN
INTRAVENOUS | Status: AC
  Filled 2020-05-05: qty 100

## 2020-05-05 MED ORDER — PROPOFOL 10 MG/ML IV BOLUS
INTRAVENOUS | Status: DC | PRN
  Administered 2020-05-05: 150 mg via INTRAVENOUS

## 2020-05-05 MED ORDER — FENTANYL CITRATE (PF) 100 MCG/2ML IJ SOLN
INTRAMUSCULAR | Status: DC | PRN
  Administered 2020-05-05: 50 ug via INTRAVENOUS
  Administered 2020-05-05 (×2): 25 ug via INTRAVENOUS

## 2020-05-05 MED ORDER — FENTANYL CITRATE (PF) 100 MCG/2ML IJ SOLN
25.0000 ug | INTRAMUSCULAR | Status: DC | PRN
  Administered 2020-05-05: 25 ug via INTRAVENOUS
  Administered 2020-05-05: 50 ug via INTRAVENOUS

## 2020-05-05 MED ORDER — AMOXICILLIN-POT CLAVULANATE 875-125 MG PO TABS: 1.0000 | ORAL_TABLET | Freq: Two times a day (BID) | ORAL | 0 refills | Status: AC

## 2020-05-05 MED ORDER — OXYCODONE HCL 5 MG/5ML PO SOLN: 5.0000 mg | Freq: Once | ORAL | Status: DC | PRN

## 2020-05-05 MED ORDER — FENTANYL CITRATE (PF) 100 MCG/2ML IJ SOLN
INTRAMUSCULAR | Status: AC
  Filled 2020-05-05: qty 2

## 2020-05-05 MED ORDER — VANCOMYCIN HCL 1000 MG IV SOLR
INTRAVENOUS | Status: AC
  Filled 2020-05-05: qty 1000

## 2020-05-05 MED ORDER — LACTATED RINGERS IV SOLN: INTRAVENOUS | Status: DC

## 2020-05-05 MED ORDER — DOXYCYCLINE HYCLATE 50 MG PO CAPS: 100.0000 mg | ORAL_CAPSULE | Freq: Two times a day (BID) | ORAL | 0 refills | Status: AC

## 2020-05-05 MED ORDER — MIDAZOLAM HCL 2 MG/2ML IJ SOLN
2.0000 mg | Freq: Once | INTRAMUSCULAR | Status: AC
  Administered 2020-05-05: 1 mg via INTRAVENOUS

## 2020-05-05 MED ORDER — BUPIVACAINE-EPINEPHRINE (PF) 0.5% -1:200000 IJ SOLN
INTRAMUSCULAR | Status: DC | PRN
  Administered 2020-05-05: 30 mL via PERINEURAL

## 2020-05-05 MED ORDER — FENTANYL CITRATE (PF) 100 MCG/2ML IJ SOLN
100.0000 ug | Freq: Once | INTRAMUSCULAR | Status: AC
  Administered 2020-05-05: 50 ug via INTRAVENOUS

## 2020-05-05 MED ORDER — CELECOXIB 100 MG PO CAPS: 100.0000 mg | ORAL_CAPSULE | Freq: Two times a day (BID) | ORAL | 0 refills | Status: AC

## 2020-05-05 MED ORDER — DOXYCYCLINE HYCLATE 100 MG IV SOLR
200.0000 mg | Freq: Once | INTRAVENOUS | Status: DC
  Filled 2020-05-05: qty 200

## 2020-05-05 MED ORDER — KETOROLAC TROMETHAMINE 10 MG PO TABS: 10.0000 mg | ORAL_TABLET | Freq: Three times a day (TID) | ORAL | 0 refills | Status: AC

## 2020-05-05 MED ORDER — ONDANSETRON HCL 4 MG PO TABS: 4.0000 mg | ORAL_TABLET | Freq: Three times a day (TID) | ORAL | 1 refills | Status: AC | PRN

## 2020-05-05 MED ORDER — MIDAZOLAM HCL 2 MG/2ML IJ SOLN
INTRAMUSCULAR | Status: AC
  Filled 2020-05-05: qty 2

## 2020-05-05 MED ORDER — ONDANSETRON HCL 4 MG/2ML IJ SOLN
INTRAMUSCULAR | Status: AC
  Filled 2020-05-05: qty 2

## 2020-05-05 MED ORDER — FENTANYL CITRATE (PF) 100 MCG/2ML IJ SOLN: 100.0000 ug | Freq: Once | INTRAMUSCULAR | Status: DC

## 2020-05-05 MED ORDER — LIDOCAINE 2% (20 MG/ML) 5 ML SYRINGE
INTRAMUSCULAR | Status: AC
  Filled 2020-05-05: qty 5

## 2020-05-05 MED ORDER — VANCOMYCIN HCL 1000 MG IV SOLR
INTRAVENOUS | Status: DC | PRN
  Administered 2020-05-05: 1000 mg via TOPICAL

## 2020-05-05 MED ORDER — OXYCODONE HCL 5 MG PO TABS: ORAL_TABLET | ORAL | 0 refills | Status: AC

## 2020-05-05 MED ORDER — CEFAZOLIN SODIUM-DEXTROSE 2-4 GM/100ML-% IV SOLN
2.0000 g | INTRAVENOUS | Status: AC
  Administered 2020-05-05: 2 g via INTRAVENOUS

## 2020-05-05 MED ORDER — ACETAMINOPHEN 500 MG PO TABS: 500.0000 mg | ORAL_TABLET | Freq: Three times a day (TID) | ORAL | 0 refills | Status: AC

## 2020-05-05 MED ORDER — OXYCODONE HCL 5 MG PO TABS: 5.0000 mg | ORAL_TABLET | Freq: Once | ORAL | Status: DC | PRN

## 2020-05-05 MED ORDER — DEXAMETHASONE SODIUM PHOSPHATE 10 MG/ML IJ SOLN
INTRAMUSCULAR | Status: AC
  Filled 2020-05-05: qty 1

## 2020-05-05 MED ORDER — ONDANSETRON HCL 4 MG/2ML IJ SOLN: 4.0000 mg | Freq: Four times a day (QID) | INTRAMUSCULAR | Status: DC | PRN

## 2020-05-05 SURGICAL SUPPLY — 64 items
APL PRP STRL LF DISP 70% ISPRP (MISCELLANEOUS) ×2
BLADE HEX COATED 2.75 (ELECTRODE) ×3 IMPLANT
BLADE SURG 10 STRL SS (BLADE) ×3 IMPLANT
BLADE SURG 15 STRL LF DISP TIS (BLADE) ×2 IMPLANT
BLADE SURG 15 STRL SS (BLADE) ×3
BNDG ELASTIC 4X5.8 VLCR STR LF (GAUZE/BANDAGES/DRESSINGS) ×3 IMPLANT
CHLORAPREP W/TINT 26 (MISCELLANEOUS) ×3 IMPLANT
CLSR STERI-STRIP ANTIMIC 1/2X4 (GAUZE/BANDAGES/DRESSINGS) ×3 IMPLANT
COVER WAND RF STERILE (DRAPES) IMPLANT
CUFF TOURN SGL QUICK 18X3 (MISCELLANEOUS) ×3 IMPLANT
CUFF TOURN SGL QUICK 24 (TOURNIQUET CUFF)
CUFF TRNQT CYL 24X4X16.5-23 (TOURNIQUET CUFF) IMPLANT
DECANTER SPIKE VIAL GLASS SM (MISCELLANEOUS) IMPLANT
DRAPE EXTREMITY T 121X128X90 (DISPOSABLE) ×3 IMPLANT
DRAPE INCISE IOBAN 66X45 STRL (DRAPES) IMPLANT
DRAPE OEC MINIVIEW 54X84 (DRAPES) IMPLANT
DRAPE U-SHAPE 47X51 STRL (DRAPES) ×3 IMPLANT
ELECT REM PT RETURN 9FT ADLT (ELECTROSURGICAL) ×3
ELECTRODE REM PT RTRN 9FT ADLT (ELECTROSURGICAL) ×2 IMPLANT
GAUZE SPONGE 4X4 12PLY STRL (GAUZE/BANDAGES/DRESSINGS) ×3 IMPLANT
GLOVE BIO SURGEON STRL SZ 6.5 (GLOVE) ×3 IMPLANT
GLOVE BIOGEL PI IND STRL 6.5 (GLOVE) ×2 IMPLANT
GLOVE BIOGEL PI IND STRL 8 (GLOVE) ×2 IMPLANT
GLOVE BIOGEL PI INDICATOR 6.5 (GLOVE) ×1
GLOVE BIOGEL PI INDICATOR 8 (GLOVE) ×1
GLOVE ECLIPSE 8.0 STRL XLNG CF (GLOVE) ×3 IMPLANT
GOWN STRL REUS W/ TWL LRG LVL3 (GOWN DISPOSABLE) ×4 IMPLANT
GOWN STRL REUS W/TWL LRG LVL3 (GOWN DISPOSABLE) ×6
GOWN STRL REUS W/TWL XL LVL3 (GOWN DISPOSABLE) ×3 IMPLANT
HANDPIECE INTERPULSE COAX TIP (DISPOSABLE) ×3
KIT PREVENA INCISION MGT 13 (CANNISTER) ×3 IMPLANT
NDL SUT 6 .5 CRC .975X.05 MAYO (NEEDLE) IMPLANT
NEEDLE MAYO TAPER (NEEDLE)
NS IRRIG 1000ML POUR BTL (IV SOLUTION) ×3 IMPLANT
PACK ARTHROSCOPY DSU (CUSTOM PROCEDURE TRAY) ×3 IMPLANT
PACK BASIN DAY SURGERY FS (CUSTOM PROCEDURE TRAY) ×3 IMPLANT
PAD CAST 4YDX4 CTTN HI CHSV (CAST SUPPLIES) ×2 IMPLANT
PADDING CAST COTTON 4X4 STRL (CAST SUPPLIES) ×3
PENCIL SMOKE EVACUATOR (MISCELLANEOUS) ×3 IMPLANT
RETRIEVER SUT HEWSON (MISCELLANEOUS) IMPLANT
SET HNDPC FAN SPRY TIP SCT (DISPOSABLE) ×2 IMPLANT
SHEET MEDIUM DRAPE 40X70 STRL (DRAPES) ×3 IMPLANT
SLEEVE SCD COMPRESS KNEE MED (MISCELLANEOUS) ×3 IMPLANT
SLING ARM FOAM STRAP LRG (SOFTGOODS) ×3 IMPLANT
SPLINT FAST PLASTER 5X30 (CAST SUPPLIES) ×10
SPLINT PLASTER CAST FAST 5X30 (CAST SUPPLIES) ×20 IMPLANT
SPONGE LAP 18X18 RF (DISPOSABLE) ×3 IMPLANT
STAPLER VISISTAT 35W (STAPLE) IMPLANT
SUCTION FRAZIER HANDLE 10FR (MISCELLANEOUS)
SUCTION TUBE FRAZIER 10FR DISP (MISCELLANEOUS) IMPLANT
SUT ETHILON 2 0 FS 18 (SUTURE) ×12 IMPLANT
SUT MNCRL AB 4-0 PS2 18 (SUTURE) ×3 IMPLANT
SUT VIC AB 0 CT1 27 (SUTURE) ×3
SUT VIC AB 0 CT1 27XBRD ANBCTR (SUTURE) ×2 IMPLANT
SUT VIC AB 1 CT1 27 (SUTURE)
SUT VIC AB 1 CT1 27XBRD ANBCTR (SUTURE) IMPLANT
SUT VIC AB 2-0 CT1 27 (SUTURE)
SUT VIC AB 2-0 CT1 TAPERPNT 27 (SUTURE) IMPLANT
SUT VIC AB 2-0 SH 27 (SUTURE) ×3
SUT VIC AB 2-0 SH 27XBRD (SUTURE) ×2 IMPLANT
SUT VIC AB 3-0 SH 27 (SUTURE)
SUT VIC AB 3-0 SH 27X BRD (SUTURE) IMPLANT
TOWEL GREEN STERILE FF (TOWEL DISPOSABLE) ×6 IMPLANT
TUBE SUCTION HIGH CAP CLEAR NV (SUCTIONS) ×3 IMPLANT

## 2020-05-05 NOTE — Telephone Encounter (Signed)
I have called patient left message to call for appointment

## 2020-05-05 NOTE — Telephone Encounter (Signed)
His note should likely be in the computer since he just had surgery and culture data also in epic

## 2020-05-05 NOTE — Telephone Encounter (Signed)
Called placed to Dr. Austin Miles office to fax referral to [facility] so we can work on getting patient in the office.

## 2020-05-05 NOTE — Op Note (Signed)
Orthopaedic Surgery Operative Note (CSN: [id number])  Kyle [name]  Jul 01, 1956 Date of Surgery: 05/05/2020   Diagnoses:  Left triceps injury possibly with elbow pain and ulnar neuritis  Procedure: Olecranon bursectomy Septic elbow incision and debridement Triceps debridement Debridement of osteomyelitis involving proximal ulna Ulnar nerve decompression and subcutaneous transposition   Operative Finding Patient's planned procedure was aborted as the patient's medical factors including spinal cord stimulator prevented [place] from obtaining an MRI.  Ultrasound to guide [place] towards the patient having a possible triceps injury however what in fact it appeared that he had was a septic olecranon bursitis that had likely turned into a osteomyelitis of the proximal ulna.  He had an abscess deep to the triceps insertion and involving part of the triceps making the tendon at the distal end atypical.  We were worried about his possibility of septic joint and we open through a lateral Kocher approach but did not see any signs of purulence.  We did wash it out and treated as if it was a septic joint.  The patient's medical conditions including his chronic pain make his complications risk higher but his triceps appear to be intact and we have consulted infectious disease as well as put the patient on doxycycline and amoxicillin orally until his cultures result.  We will defer to infectious disease for continued antibiotic treatment.  His ulnar nerve was subluxed in sitting partially anterior to the medial condyle at baseline and was likely causing significant symptoms into his hand.  We decompressed the nerve along its length and performed a true subcutaneous transposition.  We closed the incisions with nonabsorbable suture and a Prevena wound VAC was used to minimize drainage.  We gave the patient a dose of IV doxycycline the PACU prior to leaving.  Post-operative plan: The patient will be nonweightbearing in a  splint for a week and Praveena will be removed and he will start range of motion at that point with sutures to remain in for about a month.  The patient will be discharged home with infectious disease follow-up arranged.  DVT prophylaxis not indicated in this ambulatory upper extremity patient without significant risk factors.   Pain control with PRN pain medication preferring oral medicines.  Follow up plan will be scheduled in approximately 7 days for incision check and XR.  Post-Op Diagnosis: Left olecranon osteomyelitis with abscess, partial triceps injury, ulnar nerve subluxation and compression and possible septic joint, olecranon bursitis  surgeons:Primary: Bjorn Pippin, MD Assistants:Caroline McBane PA-C Location: [facility] OR ROOM 2 Anesthesia: General with regional anesthesia Antibiotics: Ancef 2 g with local vancomycin powder 1 g at the surgical site Tourniquet time:  Total Tourniquet Time Documented: Upper Arm (Left) - 61 minutes Total: Upper Arm (Left) - 61 minutes  Estimated Blood Loss: Minimal Complications: None Specimens: 2 for culture, left elbow 1 and 2 Implants: * No implants in log *  Indications for Surgery:   Kyle [name] is a 64 y.o. male with fall resulting in injury to left elbow.  He had an aspiration with his primary care doctor and then there was a work-up for possible triceps injury however ultrasound was the most advanced imaging that can be used secondary to patient spinal cord stimulator.  He had no fevers or chills.  The concern was a triceps injury.  Benefits and risks of operative and nonoperative management were discussed prior to surgery with patient/guardian(s) and informed consent form was completed.  Specific risks including infection, need for additional surgery,  triceps rerupture, stiffness amongst others.   Procedure:   The patient was identified properly. Informed consent was obtained and the surgical site was marked. The patient was taken up to  suite where general anesthesia was induced.  The patient was positioned lateral on a beanbag with arm over a sure foot positioner.  The left elbow was prepped and draped in the usual sterile fashion.  Timeout was performed before the beginning of the case.  Tourniquet was used for the above duration.  Began with a posterior approach the elbow and through skin sharp achieving hemostasis we progressed.  We identified redness around the olecranon bursa and there seemed to be olecranon bursitis which was new from his clinic visit.  We would excise the olecranon bursa and block.  At that point we identified that the triceps appear to be intact however when we probed it there was clearly purulent material that came out.  We split the triceps in line with its fibers and were able to expose the posterior aspect of the olecranon.  The olecranon fossa had some mild unhealthy appearing tissue however there was a 2 x 2 centimeter erosive lesion in the proximal aspect of the olecranon deep to the triceps insertion.  This had the appearance of infection and we debrided bone and subcutaneous tissue as well as tendon that was in the area.  The left only healthy appearing tissue.  This point were worried about the possibility of septic joint we went through Coker's interval with clean instrumentation and identified it was no obvious purulence within the joint.  We irrigated the joint with 3 L of saline and performed a synovectomy and debridement using a rondure and a knife excisionally debriding synovium and capsule.  We then turned our attention to the triceps area and removed bone as well subcutaneous tissue excisionally.  We washed this area with 3 L of normal saline.  This point her wound bed was clear.  We then identified the ulnar nerve which was sitting anterior to the medial condyle in a typical fashion.  There was significant redness and inflammation around this nerve.  We decompressed it and transposed  subcutaneously reserving branches to the FCU but sacrificing the articular branch.  We are very careful to preserve the FCU branch.  We irrigated the wound copiously before placing local antibiotic as listed above.  We closed the incision loosely with nonabsorbable sutures and then placed a splint and a Prevena dressing.  Patient was awoken taken to PACU in stable condition.  Alfonse Alpers, PA-C, present and scrubbed throughout the case, critical for completion in a timely fashion, and for retraction, instrumentation, closure.

## 2020-05-05 NOTE — Anesthesia Preprocedure Evaluation (Signed)
Anesthesia Evaluation  Patient identified by MRN, date of birth, ID band Patient awake    Reviewed: Allergy & Precautions, H&P , NPO status , Patient's Chart, lab work & pertinent test results  Airway Mallampati: II   Neck ROM: full    Dental   Pulmonary sleep apnea , former smoker,    breath sounds clear to auscultation       Cardiovascular negative cardio ROS   Rhythm:regular Rate:Normal     Neuro/Psych  Headaches, PSYCHIATRIC DISORDERS Anxiety Depression  Neuromuscular disease    GI/Hepatic   Endo/Other  diabetes, Type 2  Renal/GU      Musculoskeletal   Abdominal   Peds  Hematology   Anesthesia Other Findings   Reproductive/Obstetrics                             Anesthesia Physical Anesthesia Plan  ASA: II  Anesthesia Plan: General   Post-op Pain Management:  Regional for Post-op pain   Induction: Intravenous  PONV Risk Score and Plan: 2 and Ondansetron, Dexamethasone, Midazolam and Treatment may vary due to age or medical condition  Airway Management Planned: LMA  Additional Equipment:   Intra-op Plan:   Post-operative Plan: Extubation in OR  Informed Consent: I have reviewed the patients History and Physical, chart, labs and discussed the procedure including the risks, benefits and alternatives for the proposed anesthesia with the patient or authorized representative who has indicated his/her understanding and acceptance.       Plan Discussed with: CRNA, Anesthesiologist and Surgeon  Anesthesia Plan Comments:         Anesthesia Quick Evaluation

## 2020-05-05 NOTE — Interval H&P Note (Signed)
All questions answered. Patient elected to proceed.

## 2020-05-05 NOTE — Telephone Encounter (Signed)
I was called by Dr. Ramond Marrow re this patient he has newly diagnosed olecranon osteomyelitis and is on doxy/augmentin. Ccan we put him in with one of our MD's, providers if not next few days next week>? (DO NOT PUT W me yet since not yet clear re m schedule next 10 days

## 2020-05-05 NOTE — Progress Notes (Signed)
Assisted Dr. Hodierne with left, ultrasound guided, supraclavicular block. Side rails up, monitors on throughout procedure. See vital signs in flow sheet. Tolerated Procedure well. 

## 2020-05-05 NOTE — Discharge Instructions (Signed)
  Post Anesthesia Home Care Instructions  Activity: Get plenty of rest for the remainder of the day. A responsible individual must stay with you for 24 hours following the procedure.  For the next 24 hours, DO NOT: -Drive a car -Operate machinery -Drink alcoholic beverages -Take any medication unless instructed by your physician -Make any legal decisions or sign important papers.  Meals: Start with liquid foods such as gelatin or soup. Progress to regular foods as tolerated. Avoid greasy, spicy, heavy foods. If nausea and/or vomiting occur, drink only clear liquids until the nausea and/or vomiting subsides. Call your physician if vomiting continues.  Special Instructions/Symptoms: Your throat may feel dry or sore from the anesthesia or the breathing tube placed in your throat during surgery. If this causes discomfort, gargle with warm salt water. The discomfort should disappear within 24 hours.     Regional Anesthesia Blocks  1. Numbness or the inability to move the "blocked" extremity may last from 3-48 hours after placement. The length of time depends on the medication injected and your individual response to the medication. If the numbness is not going away after 48 hours, call your surgeon.  2. The extremity that is blocked will need to be protected until the numbness is gone and the  Strength has returned. Because you cannot feel it, you will need to take extra care to avoid injury. Because it may be weak, you may have difficulty moving it or using it. You may not know what position it is in without looking at it while the block is in effect.  3. For blocks in the legs and feet, returning to weight bearing and walking needs to be done carefully. You will need to wait until the numbness is entirely gone and the strength has returned. You should be able to move your leg and foot normally before you try and bear weight or walk. You will need someone to be with you when you first try to  ensure you do not fall and possibly risk injury.  4. Bruising and tenderness at the needle site are common side effects and will resolve in a few days.  5. Persistent numbness or new problems with movement should be communicated to the surgeon or the [facility] ([phone])/ [facility] ([phone]). 

## 2020-05-05 NOTE — Anesthesia Procedure Notes (Signed)
Procedure Name: LMA Insertion Date/Time: 05/05/2020 12:43 PM Performed by: Pearson Grippe, CRNA Pre-anesthesia Checklist: Patient identified, Emergency Drugs available, Suction available and Patient being monitored Patient Re-evaluated:Patient Re-evaluated prior to induction Oxygen Delivery Method: Circle system utilized Preoxygenation: Pre-oxygenation with 100% oxygen Induction Type: IV induction Ventilation: Mask ventilation without difficulty LMA: LMA inserted LMA Size: 5.0 Number of attempts: 1 Airway Equipment and Method: Bite block Placement Confirmation: positive ETCO2 Tube secured with: Tape Dental Injury: Teeth and Oropharynx as per pre-operative assessment

## 2020-05-05 NOTE — Anesthesia Procedure Notes (Signed)
Anesthesia Regional Block: Supraclavicular block   Pre-Anesthetic Checklist: ,, timeout performed, Correct Patient, Correct Site, Correct Laterality, Correct Procedure, Correct Position, site marked, Risks and benefits discussed,  Surgical consent,  Pre-op evaluation,  At surgeon's request and post-op pain management  Laterality: Left  Prep: chloraprep       Needles:  Injection technique: Single-shot  Needle Type: Echogenic Stimulator Needle     Needle Length: 5cm  Needle Gauge: 22     Additional Needles:   Procedures:, nerve stimulator,,,,,,,   Nerve Stimulator or Paresthesia:  Response: biceps flexion, 0.45 mA,   Additional Responses:   Narrative:  Start time: 05/05/2020 12:22 PM End time: 05/05/2020 12:30 PM Injection made incrementally with aspirations every 5 mL.  Performed by: Personally  Anesthesiologist: Achille Rich, MD  Additional Notes: Functioning IV was confirmed and monitors were applied.  A 34mm 22ga Arrow echogenic stimulator needle was used. Sterile prep and drape,hand hygiene and sterile gloves were used.  Negative aspiration and negative test dose prior to incremental administration of local anesthetic. The patient tolerated the procedure well.  Ultrasound guidance: relevent anatomy identified, needle position confirmed, local anesthetic spread visualized around nerve(s), vascular puncture avoided.  Image printed for medical record.

## 2020-05-05 NOTE — Transfer of Care (Signed)
Immediate Anesthesia Transfer of Care Note  Patient: Kyle [name]  Procedure(s) Performed: NEUROPLASTY AND TRANSPOSITION, ULNAR NERVE AT ELBOW (Left Elbow) OLECRANON BURSECTOMY DEBRIDEMENT OF BONE (Left Elbow)  Patient Location: PACU  Anesthesia Type:GA combined with regional for post-op pain  Level of Consciousness: awake, alert  and oriented  Airway & Oxygen Therapy: Patient Spontanous Breathing and Patient connected to face mask oxygen  Post-op Assessment: Report given to RN and Post -op Vital signs reviewed and stable  Post vital signs: Reviewed and stable  Last Vitals:  Vitals Value Taken Time  BP 154/98   Temp    Pulse 93   Resp 14   SpO2 98%     Last Pain:  Vitals:   05/05/20 1123  TempSrc: Oral  PainSc: 9       Patients Stated Pain Goal: 5 (05/05/20 1123)  Complications: No complications documented.

## 2020-05-06 ENCOUNTER — Encounter (HOSPITAL_BASED_OUTPATIENT_CLINIC_OR_DEPARTMENT_OTHER): Payer: Self-pay | Admitting: Orthopaedic Surgery

## 2020-05-06 NOTE — Telephone Encounter (Signed)
Notes are in Epic.

## 2020-05-07 NOTE — Anesthesia Postprocedure Evaluation (Signed)
Anesthesia Post Note  Patient: Lafe Garin  Procedure(s) Performed: OLECRANON BURSECTOMY DEBRIDEMENT TRICEP, SEPTIC ELBOW IRRIGATION AND DEBRIDEMENT DEBRIDEMENT OF OSTEOMYLELITIS PROXIMAL ULNA (Left Elbow) ULNAR NERVE DECOMPRESSION/TRANSPOSITION (Left Elbow)     Patient location during evaluation: PACU Anesthesia Type: General Level of consciousness: awake and alert Pain management: pain level controlled Vital Signs Assessment: post-procedure vital signs reviewed and stable Respiratory status: spontaneous breathing, nonlabored ventilation, respiratory function stable and patient connected to nasal cannula oxygen Cardiovascular status: blood pressure returned to baseline and stable Postop Assessment: no apparent nausea or vomiting Anesthetic complications: no   No complications documented.  Last Vitals:  Vitals:   05/05/20 1500 05/05/20 1534  BP: 135/85 (!) 164/87  Pulse: 80 80  Resp: 16 16  Temp:  36.6 C  SpO2: 93% 93%    Last Pain:  Vitals:   05/06/20 1013  TempSrc:   PainSc: 7                  Eoghan Belcher S

## 2020-05-10 LAB — AEROBIC/ANAEROBIC CULTURE W GRAM STAIN (SURGICAL/DEEP WOUND)

## 2020-05-12 ENCOUNTER — Telehealth: Payer: Self-pay

## 2020-05-12 ENCOUNTER — Inpatient Hospital Stay: Payer: Medicare PPO | Admitting: Infectious Disease

## 2020-05-12 NOTE — Telephone Encounter (Signed)
Received the following message through appointment request; Is there not another doctor there who can see my husband today?  I am really, really worried about this infection that was found in his elbow during his surgery on 8/20.  What I'm reading from the test results on this site, it is staphylococcus aureus and that is extremely serious.  He is currently taking 2 antibiotics for it, but I am still very worried and was looking forward to my husband seeing someone today concerning this. Kyle [name] [phone] No availability for today. Will send message to Dr. Renold Don if okay to schedule tomorrow at 3:45. Currently scheduled to see Dr. Daiva Eves on 11/1. Lorenso Courier, [place]

## 2020-05-12 NOTE — Telephone Encounter (Signed)
Called and spoke with pt's wife. Patient is unable to come in tomorrow. Will keep current appt with Dr. Daiva Eves. Patient advised to call office with any questions or concerns.

## 2020-05-17 ENCOUNTER — Ambulatory Visit: Payer: Medicare PPO | Admitting: Infectious Disease

## 2020-05-17 ENCOUNTER — Encounter: Payer: Self-pay | Admitting: Infectious Disease

## 2020-05-17 ENCOUNTER — Other Ambulatory Visit: Payer: Self-pay

## 2020-05-17 VITALS — BP 152/86 | HR 89 | Wt 198.0 lb

## 2020-05-17 DIAGNOSIS — M869 Osteomyelitis, unspecified: Secondary | ICD-10-CM

## 2020-05-17 DIAGNOSIS — M868X3 Other osteomyelitis, forearm: Secondary | ICD-10-CM | POA: Diagnosis not present

## 2020-05-17 DIAGNOSIS — H35063 Retinal vasculitis, bilateral: Secondary | ICD-10-CM | POA: Diagnosis not present

## 2020-05-17 DIAGNOSIS — E109 Type 1 diabetes mellitus without complications: Secondary | ICD-10-CM

## 2020-05-17 DIAGNOSIS — A4901 Methicillin susceptible Staphylococcus aureus infection, unspecified site: Secondary | ICD-10-CM | POA: Insufficient documentation

## 2020-05-17 HISTORY — DX: Methicillin susceptible Staphylococcus aureus infection, unspecified site: A49.01

## 2020-05-17 HISTORY — DX: Osteomyelitis, unspecified: M86.9

## 2020-05-17 NOTE — Progress Notes (Signed)
Reason for Consult: Ulnar osteomyelitis and olecranon septic bursitis due to methicillin sensitive staph coccus aureus  Requesting physician: Ramond Marrow   Subjective:    Patient ID: Kyle [name], male    DOB: [birth year], 64 y.o.   MRN: [id number]  HPI  Kyle [name] is a 64 year old Caucasian man with a past medical history significant for recently diagnosed temporal arteritis, corticosteroid use worsening his diabetes mellitus who had fallen 5 weeks prior to admission off of a ladder injuring his left elbow L elbow.  [facility]  Ultimately was referred to Dr. Everardo Pacific underwent CT scan that did not show a fracture.  He cannot have MRI due to his implanted spine stimulator.  He was taken to the operating room by Dr.Varkey with thought that this was a left triceps injury and elbow pain from ulnar neuritis.  However in the operating room in creatinine countered frank pus and abscess deep the triceps insertion.  He underwent olecranon bursectomy septic elbow incision and debridement triceps debridement debridement of osteomyelitis involving the proximal ulna as well as ulnar nerve decompression.  I was called by Dr. Durwin Nora and patient was placed on oral doxycycline and Augmentin in the interim.  Cultures of subsequently grown methicillin sensitive Staph aureus from operative cultures.  He was to be seen last week but my clinic had to be canceled so I am seeing him here today.  His left surgical site is still with stitches.  In talking to him he tells me that he used to be left-handed prior to another injury but now largely uses his right hand.  He says he thinks he can give IV antibiotics himself at home if he is able to use his right hand to administer them.   Past Medical History:  Diagnosis Date  . Anxiety   . Arm injury 2021   torn tendon and ligament in R arm; pending surgery   . Depression   . Diabetes (HCC)   . High cholesterol   . Migraine   .  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 Under care of [facility] in [place] for pain management since 2000  . OSA (obstructive sleep apnea)    does not use CPAP    Past Surgical History:  Procedure Laterality Date  . BACK SURGERY  1985  . CHOLECYSTECTOMY     2010?  [name] FEMUR SURGERY  1972  . OLECRANON BURSECTOMY Left 05/05/2020   Procedure: OLECRANON BURSECTOMY DEBRIDEMENT TRICEP, SEPTIC ELBOW IRRIGATION AND DEBRIDEMENT DEBRIDEMENT OF OSTEOMYLELITIS PROXIMAL ULNA;  Surgeon: Bjorn Pippin, MD;  Location: [facility];  Service: Orthopedics;  Laterality: Left;  . spinal stimulator implanted  2001  . spinal stimulator replaced  2011  . teeth removal  2020   2  . tooth removal  2020  . ULNAR NERVE TRANSPOSITION Left 05/05/2020   Procedure: ULNAR NERVE DECOMPRESSION/TRANSPOSITION;  Surgeon: Bjorn Pippin, MD;  Location: [facility];  Service: Orthopedics;  Laterality: Left;    Family History  Problem Relation Age of Onset  . Brain cancer Mother   . Heart disease Father   . Heart attack Father   . Diabetes Maternal Grandfather   . Cancer Paternal Grandmother       Social History   Socioeconomic History  . Marital status: Married    Spouse name: Gerren Hoffmeier  . Number of children: 2  .  Years of education: 84  . Highest education level: Not on file  Occupational History  . Occupation: disabled  Tobacco Use  . Smoking status: Former Smoker    Quit date: 1987    Years since quitting: 34.8  . Smokeless tobacco: Never Used  . Tobacco comment: 3-4 per day  Vaping Use  . Vaping Use: Never used  Substance and Sexual Activity  . Alcohol use: Yes    Comment: occasionally  . Drug use: Never  . Sexual activity: Not on file  Other Topics Concern  . Not on file  Social History Narrative   Lives at home with his wife   Right handed   Caffeine: 1-2 daily   Social Determinants  of Health   Financial Resource Strain:   . Difficulty of Paying Living Expenses: Not on file  Food Insecurity:   . Worried About Programme researcher, broadcasting/film/video in the Last Year: Not on file  . Ran Out of Food in the Last Year: Not on file  Transportation Needs:   . Lack of Transportation (Medical): Not on file  . Lack of Transportation (Non-Medical): Not on file  Physical Activity:   . Days of Exercise per Week: Not on file  . Minutes of Exercise per Session: Not on file  Stress:   . Feeling of Stress : Not on file  Social Connections:   . Frequency of Communication with Friends and Family: Not on file  . Frequency of Social Gatherings with Friends and Family: Not on file  . Attends Religious Services: Not on file  . Active Member of Clubs or Organizations: Not on file  . Attends Banker Meetings: Not on file  . Marital Status: Not on file    Allergies  Allergen Reactions  . Aspirin Anaphylaxis     Current Outpatient Medications:  .  acetaminophen (TYLENOL) 500 MG tablet, Take 1 tablet (500 mg total) by mouth every 8 (eight) hours for 14 days. Do not take more than 4,000 mg of Tylenol daily, Disp: 42 tablet, Rfl: 0 .  ACTEMRA ACTPEN 162 MG/0.9ML SOAJ, , Disp: , Rfl:  .  buPROPion (WELLBUTRIN XL) 150 MG 24 hr tablet, Take 150 mg by mouth 3 (three) times daily., Disp: , Rfl:  .  gabapentin (NEURONTIN) 300 MG capsule, Take 2 capsules (600 mg total) by mouth 3 (three) times daily., Disp: 540 capsule, Rfl: 4 .  glipiZIDE (GLUCOTROL XL) 10 MG 24 hr tablet, Take 10 mg by mouth 2 (two) times daily., Disp: , Rfl:  .  insulin glargine (LANTUS) 100 UNIT/ML injection, Inject 20 Units into the skin 2 (two) times daily., Disp: , Rfl:  .  oxyCODONE-acetaminophen (PERCOCET/ROXICET) 5-325 MG tablet, Take 2 tablets by mouth 3 (three) times daily as needed. , Disp: , Rfl:  .  tiZANidine (ZANAFLEX) 2 MG tablet, Take 2 mg by mouth 2 (two) times a day., Disp: , Rfl:  .  trazodone (DESYREL) 300 MG  tablet, Take 300 mg by mouth at bedtime., Disp: , Rfl:  .  zolpidem (AMBIEN CR) 12.5 MG CR tablet, Take 12.5 mg by mouth at bedtime., Disp: , Rfl:  .  celecoxib (CELEBREX) 100 MG capsule, Take 1 capsule (100 mg total) by mouth 2 (two) times daily. For 2 weeks. Then take as needed (Patient not taking: Reported on 05/17/2020), Disp: 60 capsule, Rfl: 0 .  divalproex (DEPAKOTE ER) 500 MG 24 hr tablet, Start with one pill at bedtime for 1 week then increase to  2 pills at bedtime. (Patient not taking: Reported on 05/17/2020), Disp: 30 tablet, Rfl: 11   Review of Systems  Constitutional: Negative for activity change, appetite change, chills, diaphoresis, fatigue, fever and unexpected weight change.  HENT: Negative for congestion, rhinorrhea, sinus pressure, sneezing, sore throat and trouble swallowing.   Eyes: Negative for photophobia and visual disturbance.  Respiratory: Negative for cough, chest tightness, shortness of breath, wheezing and stridor.   Cardiovascular: Negative for chest pain, palpitations and leg swelling.  Gastrointestinal: Negative for abdominal distention, abdominal pain, anal bleeding, blood in stool, constipation, diarrhea, nausea and vomiting.  Genitourinary: Negative for difficulty urinating, dysuria, flank pain and hematuria.  Musculoskeletal: Positive for arthralgias. Negative for back pain, gait problem, joint swelling and myalgias.  Skin: Positive for wound. Negative for color change, pallor and rash.  Neurological: Negative for dizziness, tremors, weakness and light-headedness.  Hematological: Negative for adenopathy. Does not bruise/bleed easily.  Psychiatric/Behavioral: Negative for agitation, behavioral problems, confusion, decreased concentration, dysphoric mood and sleep disturbance.       Objective:   Physical Exam Constitutional:      Appearance: He is well-developed.  HENT:     Head: Normocephalic and atraumatic.  Eyes:     Conjunctiva/sclera: Conjunctivae  normal.  Cardiovascular:     Rate and Rhythm: Normal rate and regular rhythm.  Pulmonary:     Effort: Pulmonary effort is normal. No respiratory distress.     Breath sounds: No wheezing.  Abdominal:     General: There is no distension.     Palpations: Abdomen is soft.  Musculoskeletal:        General: No tenderness. Normal range of motion.     Cervical back: Normal range of motion and neck supple.  Skin:    General: Skin is warm and dry.     Coloration: Skin is not pale.     Findings: No erythema or rash.  Neurological:     General: No focal deficit present.     Mental Status: He is alert and oriented to person, place, and time.  Psychiatric:        Mood and Affect: Mood normal.        Behavior: Behavior normal.        Thought Content: Thought content normal.        Judgment: Judgment normal.    Left arm with stitches in place still some bruising around the site of injury initially.         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  We will plan on placing PICC through interventional radiology.  Note he is already had cefazolin preoperatively in the hospital so should not need stay in short stay.  I plan on giving him 6 to 8 weeks of IV cefazolin with reevaluation with me prior to stopping IV antibiotics.    Temporal arteritis: Currently off corticosteroids due to complications associated with this.  Diabetes mellitus followed by PCP.  I spent greater than 60 minutes with the patient including greater than 50% of time in face to face counsel of the patient and his wife regarding the nature of MSSA deep infections including joints and bone and how they are treated with IV antibiotics and in coordination of his care and in coordination o

## 2020-05-18 ENCOUNTER — Telehealth: Payer: Self-pay

## 2020-05-18 NOTE — Telephone Encounter (Signed)
Kyle [name] at [facility], her line was busy so I left a message informing her I would like to get patient set up with a central line and to call us back at [phone]. [name]

## 2020-05-19 ENCOUNTER — Telehealth: Payer: Self-pay | Admitting: *Deleted

## 2020-05-19 NOTE — Addendum Note (Signed)
Addended by: Andree Coss on: 05/19/2020 05:01 PM   Modules accepted: Orders

## 2020-05-19 NOTE — Telephone Encounter (Signed)
Patient's antibiotic orders have been faxed to [facility]. He has had these antibiotics previously, does not need 1st dose at short stay.  [facility] working on Ambulance person, as patient lives in [place]. His insurance has approved medication and copay is affordable.  PICC order corrected per IR.  Andree Coss, RN

## 2020-05-21 NOTE — Telephone Encounter (Signed)
Spoke with Kyle [name] at [facility] to give update. Patient scheduled for PICC placement at [facility] on 11/9 at 12:30. Patient does NOT need first dose in short stay, but Pam from [facility] is planning to meet with patient while at [facility] and do the teaching with him and deliver medication/supplies. Andree Coss, RN

## 2020-05-21 NOTE — Telephone Encounter (Signed)
Patient will be covered by [facility] nursing, starting 11/10. Andree Coss, RN

## 2020-05-24 ENCOUNTER — Other Ambulatory Visit: Payer: Self-pay | Admitting: Radiology

## 2020-05-25 ENCOUNTER — Other Ambulatory Visit: Payer: Self-pay | Admitting: Infectious Disease

## 2020-05-25 ENCOUNTER — Other Ambulatory Visit: Payer: Self-pay

## 2020-05-25 ENCOUNTER — Ambulatory Visit (HOSPITAL_COMMUNITY)
Admission: RE | Admit: 2020-05-25 | Discharge: 2020-05-25 | Disposition: A | Discharge status: Home / Self Care | Payer: Medicare PPO | Source: Ambulatory Visit | Attending: Infectious Disease | Admitting: Infectious Disease

## 2020-05-25 DIAGNOSIS — M869 Osteomyelitis, unspecified: Secondary | ICD-10-CM | POA: Diagnosis not present

## 2020-05-25 DIAGNOSIS — E109 Type 1 diabetes mellitus without complications: Secondary | ICD-10-CM

## 2020-05-25 DIAGNOSIS — A4901 Methicillin susceptible Staphylococcus aureus infection, unspecified site: Secondary | ICD-10-CM

## 2020-05-25 DIAGNOSIS — H35063 Retinal vasculitis, bilateral: Secondary | ICD-10-CM

## 2020-05-25 DIAGNOSIS — Z452 Encounter for adjustment and management of vascular access device: Secondary | ICD-10-CM | POA: Diagnosis not present

## 2020-05-25 DIAGNOSIS — M868X3 Other osteomyelitis, forearm: Secondary | ICD-10-CM

## 2020-05-25 DIAGNOSIS — M86132 Other acute osteomyelitis, left radius and ulna: Secondary | ICD-10-CM | POA: Diagnosis not present

## 2020-05-25 DIAGNOSIS — M7022 Olecranon bursitis, left elbow: Secondary | ICD-10-CM | POA: Diagnosis not present

## 2020-05-25 IMAGING — US IR PICC >5YO
1 series · 2 of 2 positions shown · non-contrast
Comparison: none

CLINICAL DATA: Left ulna osteomyelitis, needs durable venous access
for planned antibiotic regimen

EXAM:
PICC PLACEMENT WITH ULTRASOUND AND FLUOROSCOPY
FLUOROSCOPY TIME:  6 seconds; 1 mGy
TECHNIQUE: After written informed consent was obtained, patient was placed in
the supine position on angiographic table. Patency of the left
basilic vein was confirmed with ultrasound with image documentation.
An appropriate skin site was determined. Skin site was marked.
Region was prepped using maximum barrier technique including cap and
mask, sterile gown, sterile gloves, large sterile sheet, and
Chlorhexidine as cutaneous antisepsis. The region was infiltrated
locally with 1% lidocaine. Under real-time ultrasound guidance, the
left basilic vein was accessed with a 21 gauge micropuncture needle;
the needle tip within the vein was confirmed with ultrasound image
documentation. Needle exchanged over a 018 guidewire for a peel-away
sheath, through which a 5-French single-lumen power injectable PICC
trimmed to 45cm was advanced, positioned with its tip near the
cavoatrial junction. Spot chest radiograph confirms appropriate
catheter position. Catheter was flushed per protocol and secured
externally. The patient tolerated procedure well.
COMPLICATIONS:
COMPLICATIONS
none

[Series 1: (id) · 2 of 2 slices shown]
[im 1/2]
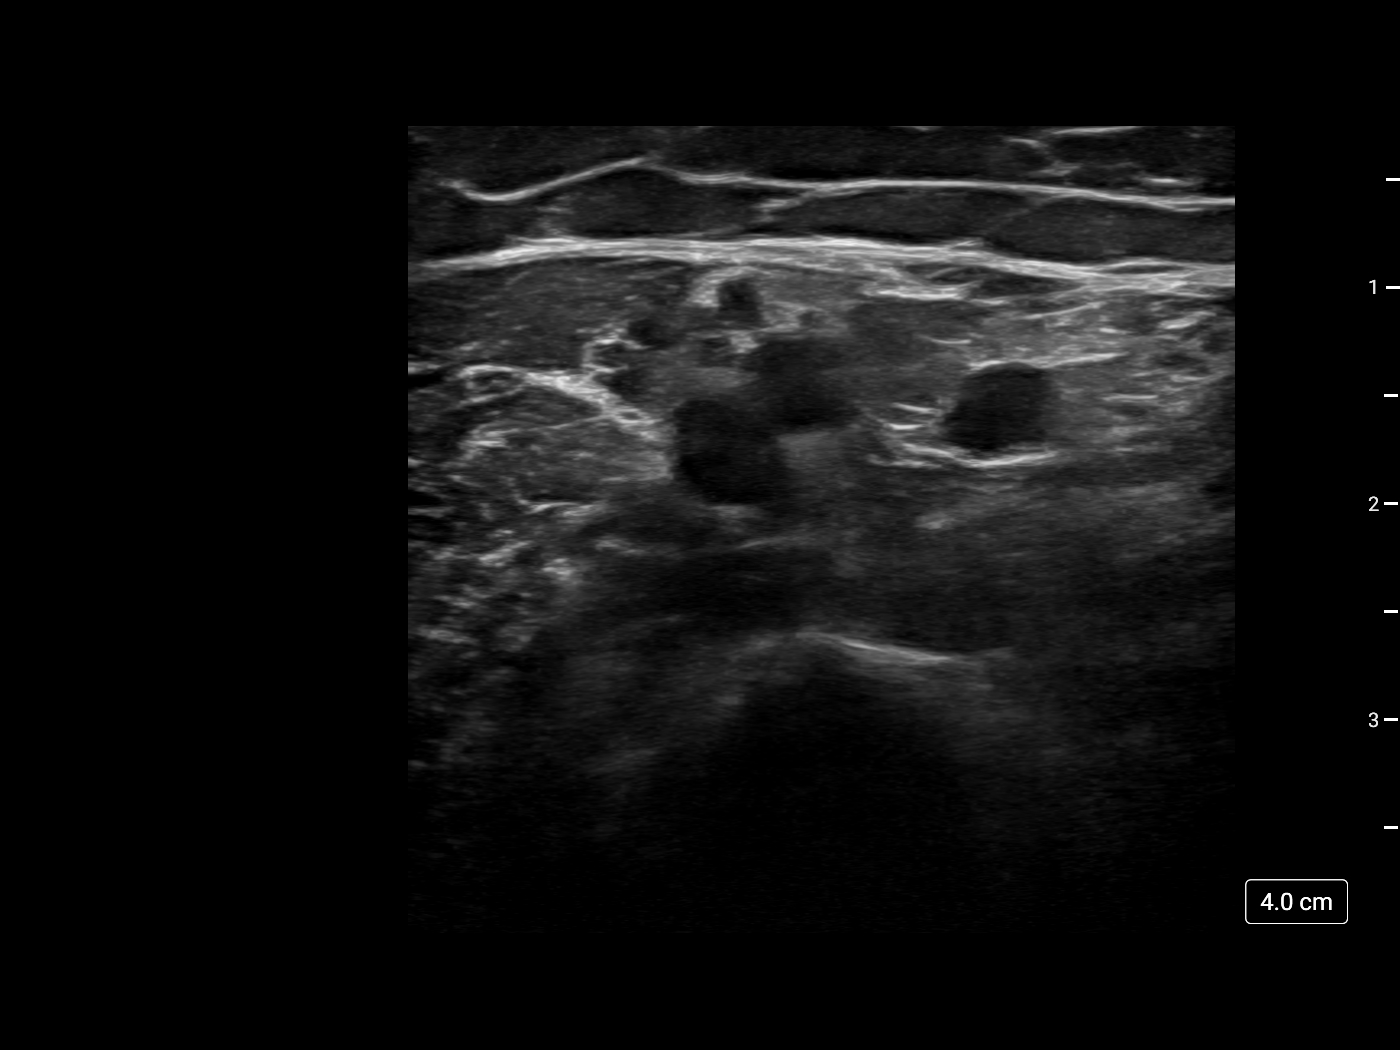
[im 2/2]
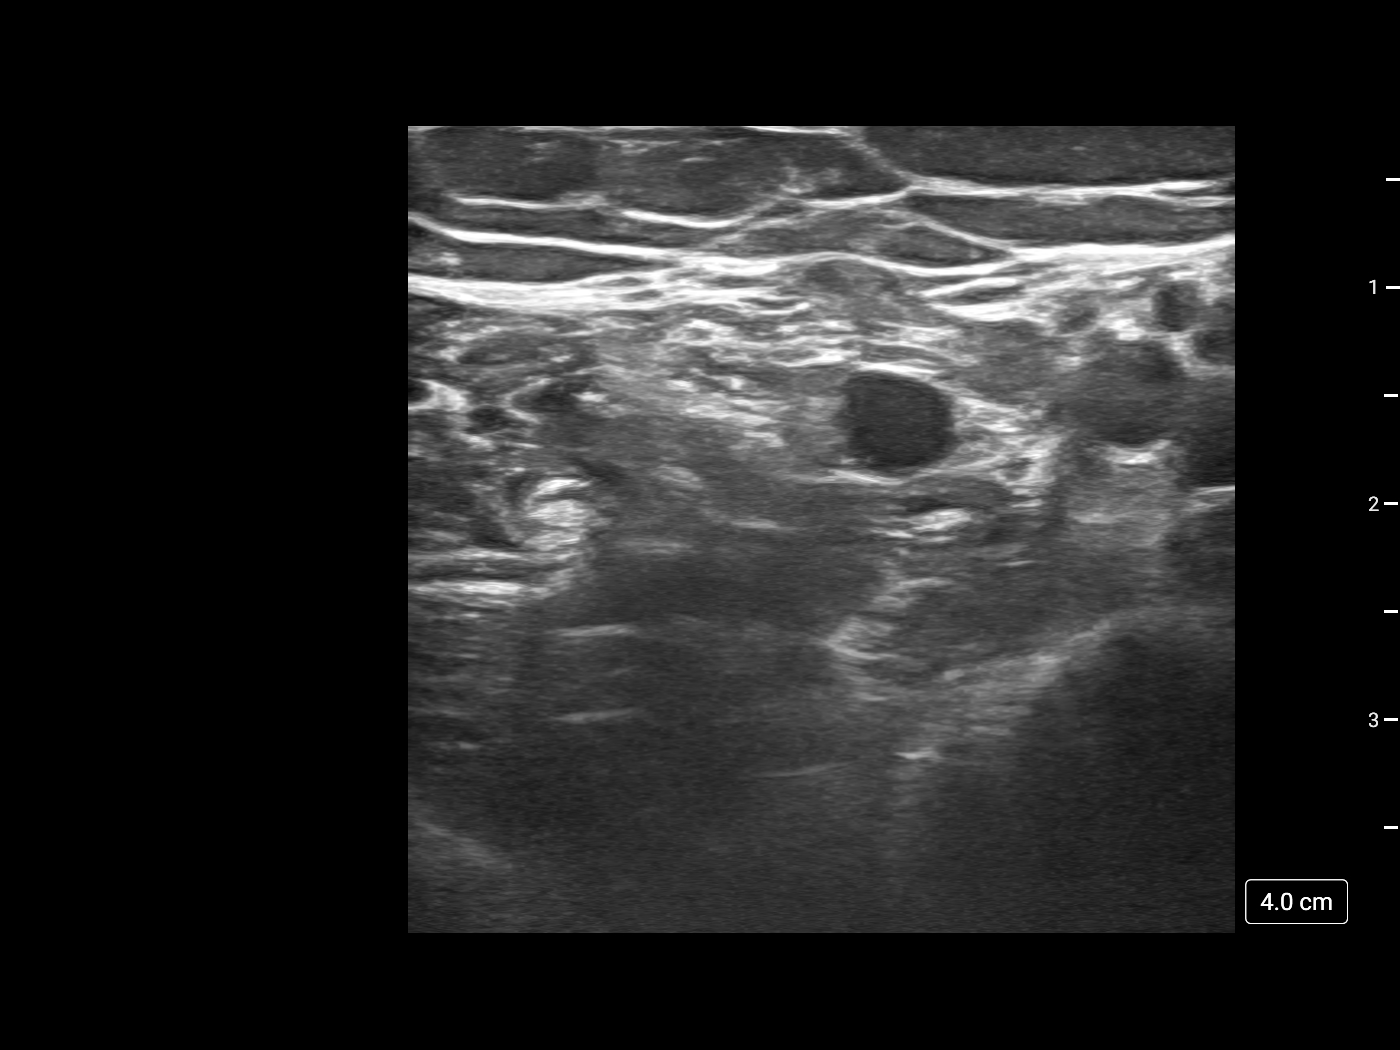

[2 of 2 positions shown; findings below may reference images not displayed]

IMPRESSION: 1. Technically successful five French single lumen power injectable
PICC placement

## 2020-05-25 MED ORDER — HEPARIN SOD (PORK) LOCK FLUSH 100 UNIT/ML IV SOLN
INTRAVENOUS | Status: AC
  Filled 2020-05-25: qty 5

## 2020-05-25 MED ORDER — LIDOCAINE HCL 1 % IJ SOLN
INTRAMUSCULAR | Status: AC
  Filled 2020-05-25: qty 20

## 2020-05-25 NOTE — Procedures (Signed)
°  Procedure: L arm PICC placement   EBL:   minimal Complications:  none immediate  See full dictation in [employer].  Thora Lance MD Main # [phone] Pager  [phone] Mobile [phone]

## 2020-05-26 DIAGNOSIS — A4901 Methicillin susceptible Staphylococcus aureus infection, unspecified site: Secondary | ICD-10-CM | POA: Diagnosis not present

## 2020-05-26 DIAGNOSIS — M869 Osteomyelitis, unspecified: Secondary | ICD-10-CM | POA: Diagnosis not present

## 2020-05-26 DIAGNOSIS — M86132 Other acute osteomyelitis, left radius and ulna: Secondary | ICD-10-CM | POA: Diagnosis not present

## 2020-05-26 DIAGNOSIS — M7022 Olecranon bursitis, left elbow: Secondary | ICD-10-CM | POA: Diagnosis not present

## 2020-05-31 ENCOUNTER — Other Ambulatory Visit: Payer: Self-pay

## 2020-05-31 ENCOUNTER — Ambulatory Visit: Payer: Medicare PPO | Admitting: Neurology

## 2020-05-31 DIAGNOSIS — G500 Trigeminal neuralgia: Secondary | ICD-10-CM | POA: Diagnosis not present

## 2020-05-31 DIAGNOSIS — R519 Headache, unspecified: Secondary | ICD-10-CM

## 2020-05-31 DIAGNOSIS — G43001 Migraine without aura, not intractable, with status migrainosus: Secondary | ICD-10-CM | POA: Diagnosis not present

## 2020-05-31 NOTE — Progress Notes (Signed)
Nerve block w/o steroid: Pt signed consent  0.5% Bupivocaine 4.5 mL LOT: [id number] EXP: 09/14/2021 NDC: 3846-6599-35  2% Lidocaine 4.5 mL LOT: [id number] EXP: 06/16/2020 NDC: 7017-7939-03  Orders written and signed for IV infusion of Depacon 1 gram x 1 per Dr Lucia Gaskins.

## 2020-06-01 ENCOUNTER — Encounter: Payer: Self-pay | Admitting: Infectious Disease

## 2020-06-01 DIAGNOSIS — M869 Osteomyelitis, unspecified: Secondary | ICD-10-CM | POA: Diagnosis not present

## 2020-06-01 DIAGNOSIS — M7022 Olecranon bursitis, left elbow: Secondary | ICD-10-CM | POA: Diagnosis not present

## 2020-06-01 DIAGNOSIS — M86132 Other acute osteomyelitis, left radius and ulna: Secondary | ICD-10-CM | POA: Diagnosis not present

## 2020-06-01 DIAGNOSIS — A4901 Methicillin susceptible Staphylococcus aureus infection, unspecified site: Secondary | ICD-10-CM | POA: Diagnosis not present

## 2020-06-02 ENCOUNTER — Encounter: Payer: Self-pay | Admitting: Infectious Disease

## 2020-06-02 DIAGNOSIS — Z792 Long term (current) use of antibiotics: Secondary | ICD-10-CM | POA: Diagnosis not present

## 2020-06-02 DIAGNOSIS — Z5181 Encounter for therapeutic drug level monitoring: Secondary | ICD-10-CM | POA: Diagnosis not present

## 2020-06-02 DIAGNOSIS — B9561 Methicillin susceptible Staphylococcus aureus infection as the cause of diseases classified elsewhere: Secondary | ICD-10-CM | POA: Diagnosis not present

## 2020-06-03 DIAGNOSIS — A4901 Methicillin susceptible Staphylococcus aureus infection, unspecified site: Secondary | ICD-10-CM | POA: Diagnosis not present

## 2020-06-03 DIAGNOSIS — M7022 Olecranon bursitis, left elbow: Secondary | ICD-10-CM | POA: Diagnosis not present

## 2020-06-03 DIAGNOSIS — M86132 Other acute osteomyelitis, left radius and ulna: Secondary | ICD-10-CM | POA: Diagnosis not present

## 2020-06-03 DIAGNOSIS — M869 Osteomyelitis, unspecified: Secondary | ICD-10-CM | POA: Diagnosis not present

## 2020-06-07 NOTE — Progress Notes (Signed)
Today pain went from 6/10 to a 0/10, tried multiple avenues, oral headache medicatons, CGRPs, botox , nothing helps like nerve blocks tried ubrelvy and nurtec - failed multiple procedures. May consider trying to find someone to perform neurolysis or ablation or other on the suprorbital nerve to see if this helps as his worst area is right temporal. Hemicrania continua?   Performed by Dr. Lucia Gaskins M.D.  All procedures a documented blood were medically necessary, reasonable and appropriate based on the patient's history, medical diagnosis and physician opinion. Verbal informed consent was obtained from the patient, patient was informed of potential risk of procedure, including bruising, bleeding, hematoma formation, infection, muscle weakness, muscle pain, numbness, transient hypertension, transient hyperglycemia and transient insomnia among others. All areas injected were topically clean with isopropyl rubbing alcohol. Nonsterile nonlatex gloves were worn during the procedure. Will repeat in 3- weeks, depacon is in short supply at this time cannot provide migraine cocktail.   1 Supraorbital nerve block (64400): Supraorbital nerve site was identified along the incision of the frontal bone on the orbital/supraorbital ridge. Medication was injected into the right supraorbital nerve areas. Patient's condition is associated with inflammation of the supraorbital and associated muscle groups. Injection was deemed medically necessary, reasonable and appropriate. Injection represents a separate and unique surgical service.  I spent of face-to-face and non-face-to-face time with patient on the  1. Right sided temporal headache   2. Supraorbital neuralgia    diagnosis.  This included previsit chart review, lab review, study review, order entry, electronic health record documentation, patient education on the different diagnostic and therapeutic options, counseling and coordination of care, risks and benefits of  management, compliance, or risk factor reduction. Also provided depacon injection today.

## 2020-06-08 ENCOUNTER — Encounter: Payer: Self-pay | Admitting: Infectious Disease

## 2020-06-08 DIAGNOSIS — M869 Osteomyelitis, unspecified: Secondary | ICD-10-CM | POA: Diagnosis not present

## 2020-06-08 DIAGNOSIS — M86132 Other acute osteomyelitis, left radius and ulna: Secondary | ICD-10-CM | POA: Diagnosis not present

## 2020-06-08 DIAGNOSIS — A4901 Methicillin susceptible Staphylococcus aureus infection, unspecified site: Secondary | ICD-10-CM | POA: Diagnosis not present

## 2020-06-08 DIAGNOSIS — M7022 Olecranon bursitis, left elbow: Secondary | ICD-10-CM | POA: Diagnosis not present

## 2020-06-08 DIAGNOSIS — B9561 Methicillin susceptible Staphylococcus aureus infection as the cause of diseases classified elsewhere: Secondary | ICD-10-CM | POA: Diagnosis not present

## 2020-06-10 DIAGNOSIS — M86132 Other acute osteomyelitis, left radius and ulna: Secondary | ICD-10-CM | POA: Diagnosis not present

## 2020-06-10 DIAGNOSIS — A4901 Methicillin susceptible Staphylococcus aureus infection, unspecified site: Secondary | ICD-10-CM | POA: Diagnosis not present

## 2020-06-10 DIAGNOSIS — M869 Osteomyelitis, unspecified: Secondary | ICD-10-CM | POA: Diagnosis not present

## 2020-06-10 DIAGNOSIS — M7022 Olecranon bursitis, left elbow: Secondary | ICD-10-CM | POA: Diagnosis not present

## 2020-06-14 ENCOUNTER — Other Ambulatory Visit: Payer: Self-pay

## 2020-06-14 ENCOUNTER — Ambulatory Visit (INDEPENDENT_AMBULATORY_CARE_PROVIDER_SITE_OTHER): Payer: Medicare PPO | Admitting: Infectious Disease

## 2020-06-14 ENCOUNTER — Encounter: Payer: Self-pay | Admitting: Infectious Disease

## 2020-06-14 VITALS — BP 153/93 | HR 91 | Temp 97.8°F | Resp 16 | Ht 74.0 in | Wt 202.0 lb

## 2020-06-14 DIAGNOSIS — M86032 Acute hematogenous osteomyelitis, left radius and ulna: Secondary | ICD-10-CM

## 2020-06-14 DIAGNOSIS — E109 Type 1 diabetes mellitus without complications: Secondary | ICD-10-CM | POA: Diagnosis not present

## 2020-06-14 DIAGNOSIS — A4901 Methicillin susceptible Staphylococcus aureus infection, unspecified site: Secondary | ICD-10-CM

## 2020-06-14 DIAGNOSIS — R29898 Other symptoms and signs involving the musculoskeletal system: Secondary | ICD-10-CM | POA: Insufficient documentation

## 2020-06-14 DIAGNOSIS — M316 Other giant cell arteritis: Secondary | ICD-10-CM

## 2020-06-14 HISTORY — DX: Other symptoms and signs involving the musculoskeletal system: R29.898

## 2020-06-14 NOTE — Progress Notes (Signed)
Chief complaint: left hip weakness and pain  Subjective:    Patient ID: Kyle [name], male    DOB: [birth year], 64 y.o.   MRN: [id number]  HPI  Kyle [name] is a 64 year old Caucasian man with a past medical history significant for recently diagnosed temporal arteritis, corticosteroid use worsening his diabetes mellitus who had fallen 5 weeks prior to admission off of a ladder injuring his left elbow L elbow.  He been evaluated at [facility].  Ultimately was referred to Dr. Griffin Basil underwent CT scan that did not show a fracture.  He cannot have MRI due to his implanted spine stimulator.  He was taken to the operating room by Dr.Varkey with thought that this was a left triceps injury and elbow pain from ulnar neuritis.  However in the operating room in creatinine countered frank pus and abscess deep the triceps insertion.  He underwent olecranon bursectomy septic elbow incision and debridement triceps debridement debridement of osteomyelitis involving the proximal ulna as well as ulnar nerve decompression.  I was called by Dr. Griffin Basil and patient was placed on oral doxycycline and Augmentin in the interim.  Cultures of subsequently grown methicillin sensitive Staph aureus from operative cultures  We had picc placed and he has been started on IV ancef.  He has now noticed over the past several weeks pain in left hip and weakness with leg nearly "giving out on me."     Past Medical History:  Diagnosis Date  . Anxiety   . Arm injury 2021   torn tendon and ligament in R arm; pending surgery   . Depression   . Diabetes ([place])   . High cholesterol   . Migraine   . MSSA (methicillin susceptible Staphylococcus aureus) infection 05/17/2020  . 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 Under care of [facility] in [place] for pain management since 2000  . OSA (obstructive sleep apnea)    does not use CPAP  .  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 BACK SURGERY  1985  . CHOLECYSTECTOMY     2010?  [name] [place]  . OLECRANON BURSECTOMY Left 05/05/2020   Procedure: OLECRANON BURSECTOMY DEBRIDEMENT TRICEP, SEPTIC ELBOW IRRIGATION AND DEBRIDEMENT DEBRIDEMENT OF OSTEOMYLELITIS PROXIMAL ULNA;  Surgeon: Hiram Gash, MD;  Location: [facility];  Service: Orthopedics;  Laterality: Left;  . spinal stimulator implanted  2001  . spinal stimulator replaced  2011  . teeth removal  2020   2  . tooth removal  2020  . ULNAR NERVE TRANSPOSITION Left 05/05/2020   Procedure: ULNAR NERVE DECOMPRESSION/TRANSPOSITION;  Surgeon: Hiram Gash, MD;  Location: [facility];  Service: Orthopedics;  Laterality: Left;    Family History  Problem Relation Age of Onset  . Brain cancer Mother   . Heart disease Father   . Heart attack Father   . Diabetes Maternal Grandfather   . Cancer Paternal Grandmother       Social History   Socioeconomic History  . Marital status: Married    Spouse name: [name]  . Number of children: 2  . Years of education: 10  . Highest education level: Not on file  Occupational History  . Occupation: disabled  Tobacco Use  . Smoking status: Former Smoker    Quit date: 1987    Years since  quitting: 34.9  . Smokeless tobacco: Never Used  . Tobacco comment: 3-4 per day  Vaping Use  . Vaping Use: Never used  Substance and Sexual Activity  . Alcohol use: Yes    Comment: occasionally  . Drug use: Never  . Sexual activity: Not on file  Other Topics Concern  . Not on file  Social History Narrative   Lives at home with his wife   Right handed   Caffeine: 1-2 daily   Social Determinants of Health   Financial Resource Strain:   . Difficulty of Paying Living Expenses: Not on file  Food Insecurity:   . Worried About Charity fundraiser in the Last Year: Not on file  . Ran Out  of Food in the Last Year: Not on file  Transportation Needs:   . Lack of Transportation (Medical): Not on file  . Lack of Transportation (Non-Medical): Not on file  Physical Activity:   . Days of Exercise per Week: Not on file  . Minutes of Exercise per Session: Not on file  Stress:   . Feeling of Stress : Not on file  Social Connections:   . Frequency of Communication with Friends and Family: Not on file  . Frequency of Social Gatherings with Friends and Family: Not on file  . Attends Religious Services: Not on file  . Active Member of Clubs or Organizations: Not on file  . Attends Archivist Meetings: Not on file  . Marital Status: Not on file    Allergies  Allergen Reactions  . Aspirin Anaphylaxis  . Ibuprofen Hives and Swelling     Current Outpatient Medications:  .  ACTEMRA ACTPEN 162 MG/0.9ML SOAJ, , Disp: , Rfl:  .  BD INSULIN SYRINGE U/F 31G X 5/16" 1 ML MISC, , Disp: , Rfl:  .  buPROPion (WELLBUTRIN XL) 150 MG 24 hr tablet, Take 150 mg by mouth 3 (three) times daily., Disp: , Rfl:  .  ceFAZolin (ANCEF) 10 g injection, , Disp: , Rfl:  .  divalproex (DEPAKOTE ER) 500 MG 24 hr tablet, Start with one pill at bedtime for 1 week then increase to 2 pills at bedtime., Disp: 30 tablet, Rfl: 11 .  famotidine (PEPCID) 20 MG tablet, , Disp: , Rfl:  .  gabapentin (NEURONTIN) 300 MG capsule, Take 2 capsules (600 mg total) by mouth 3 (three) times daily., Disp: 540 capsule, Rfl: 4 .  glipiZIDE (GLUCOTROL XL) 10 MG 24 hr tablet, Take 10 mg by mouth 2 (two) times daily., Disp: , Rfl:  .  insulin glargine (LANTUS) 100 UNIT/ML injection, Inject 20 Units into the skin 2 (two) times daily., Disp: , Rfl:  .  meloxicam (MOBIC) 15 MG tablet, , Disp: , Rfl:  .  naloxone (NARCAN) 4 MG/0.1ML LIQD nasal spray kit, Place into the nose., Disp: , Rfl:  .  omeprazole (PRILOSEC) 10 MG capsule, Take by mouth., Disp: , Rfl:  .  oxyCODONE-acetaminophen (PERCOCET/ROXICET) 5-325 MG tablet, Take 2  tablets by mouth 3 (three) times daily as needed. , Disp: , Rfl:  .  tiZANidine (ZANAFLEX) 2 MG tablet, Take 2 mg by mouth 2 (two) times a day., Disp: , Rfl:  .  trazodone (DESYREL) 300 MG tablet, Take 300 mg by mouth at bedtime., Disp: , Rfl:  .  zolpidem (AMBIEN CR) 12.5 MG CR tablet, Take 12.5 mg by mouth at bedtime., Disp: , Rfl:    Review of Systems  Constitutional: Negative for activity change, appetite change,  chills, diaphoresis, fatigue, fever and unexpected weight change.  HENT: Negative for congestion, rhinorrhea, sinus pressure, sneezing, sore throat and trouble swallowing.   Eyes: Negative for photophobia and visual disturbance.  Respiratory: Negative for cough, chest tightness, shortness of breath, wheezing and stridor.   Cardiovascular: Negative for chest pain, palpitations and leg swelling.  Gastrointestinal: Negative for abdominal distention, abdominal pain, anal bleeding, blood in stool, constipation, diarrhea, nausea and vomiting.  Genitourinary: Negative for difficulty urinating, dysuria, flank pain and hematuria.  Musculoskeletal: Positive for arthralgias. Negative for back pain, gait problem, joint swelling and myalgias.  Skin: Positive for wound. Negative for color change, pallor and rash.  Neurological: Negative for dizziness, tremors, weakness and light-headedness.  Hematological: Negative for adenopathy. Does not bruise/bleed easily.  Psychiatric/Behavioral: Negative for agitation, behavioral problems, confusion, decreased concentration, dysphoric mood and sleep disturbance.       Objective:   Physical Exam Constitutional:      Appearance: He is well-developed.  HENT:     Head: Normocephalic and atraumatic.  Eyes:     Conjunctiva/sclera: Conjunctivae normal.  Cardiovascular:     Rate and Rhythm: Normal rate and regular rhythm.  Pulmonary:     Effort: Pulmonary effort is normal. No respiratory distress.     Breath sounds: No wheezing.  Abdominal:      General: There is no distension.     Palpations: Abdomen is soft.  Musculoskeletal:        General: No tenderness. Normal range of motion.     Cervical back: Normal range of motion and neck supple.     Right hip: Normal.     Left hip: Normal.  Skin:    General: Skin is warm and dry.     Coloration: Skin is not pale.     Findings: No erythema or rash.  Neurological:     General: No focal deficit present.     Mental Status: He is alert and oriented to person, place, and time.     Motor: Motor function is intact.  Psychiatric:        Mood and Affect: Mood normal.        Behavior: Behavior normal.        Thought Content: Thought content normal.        Judgment: Judgment normal.    Left arm is healing up     PICC is clean          Assessment & Plan:   Left leg weakness: I am MORE worried this could represent Lumbar spine or CNS brain pathology  I have ordered MRI spine  His spinal stimulator model is pictured below      Would like him to alert Neurology who he claims he is seeing tomorrow.   Septic olecranon bursitis and ulnar osteomyelitis status post surgery removal of bone and recovery of MSSA.  Continue ancef as originally planned  Temporal arteritis: Currently off corticosteroids due to complications associated with this.  Diabetes mellitus followed by PCP.

## 2020-06-15 ENCOUNTER — Telehealth: Payer: Self-pay

## 2020-06-15 ENCOUNTER — Other Ambulatory Visit: Payer: Self-pay | Admitting: Neurology

## 2020-06-15 DIAGNOSIS — R29898 Other symptoms and signs involving the musculoskeletal system: Secondary | ICD-10-CM

## 2020-06-15 DIAGNOSIS — H5460 Unqualified visual loss, one eye, unspecified: Secondary | ICD-10-CM

## 2020-06-15 DIAGNOSIS — R29818 Other symptoms and signs involving the nervous system: Secondary | ICD-10-CM

## 2020-06-15 DIAGNOSIS — H5461 Unqualified visual loss, right eye, normal vision left eye: Secondary | ICD-10-CM

## 2020-06-15 NOTE — Telephone Encounter (Signed)
-----   Message from Anson Fret, MD sent at 06/14/2020  8:04 PM EST ----- Strange, He is not on my schedule until 12/15. I'll have my staff call him and recommend we order an MRI brain in the meantime, thanks for the head's up.  Thamar Holik - would you call him and see if he is agreeable to get an MRI of the brain due to concerns of his new left leg weakness? Probably better to address with his wife as she is really on top of everything and advice if he has any acute changes to go to the ED? thanks  ----- Message ----- From: Daiva Eves, Lisette Grinder, MD Sent: 06/14/2020   4:18 PM EST To: Anson Fret, MD  Pt co left sided leg weakness with leg "giving out at times " x several weeks. He DOES not have any hip pathology by exam. I worry about CNS or lumbar pathology. I ordered MRI L spine but needs to see you and also get MRI brain. He says he is coming to see you tomorrow

## 2020-06-15 NOTE — Telephone Encounter (Signed)
Spoke to pt and his wife, They both agree to MRI.  They are also not sure if leg pain is due to being 1 month behind of his weekly Actemra injections

## 2020-06-16 ENCOUNTER — Encounter: Payer: Self-pay | Admitting: Infectious Disease

## 2020-06-16 DIAGNOSIS — M7022 Olecranon bursitis, left elbow: Secondary | ICD-10-CM | POA: Diagnosis not present

## 2020-06-16 DIAGNOSIS — A4901 Methicillin susceptible Staphylococcus aureus infection, unspecified site: Secondary | ICD-10-CM | POA: Diagnosis not present

## 2020-06-16 DIAGNOSIS — M86132 Other acute osteomyelitis, left radius and ulna: Secondary | ICD-10-CM | POA: Diagnosis not present

## 2020-06-16 DIAGNOSIS — M869 Osteomyelitis, unspecified: Secondary | ICD-10-CM | POA: Diagnosis not present

## 2020-06-16 DIAGNOSIS — B9561 Methicillin susceptible Staphylococcus aureus infection as the cause of diseases classified elsewhere: Secondary | ICD-10-CM | POA: Diagnosis not present

## 2020-06-16 NOTE — Telephone Encounter (Signed)
Ethelda Chick: [phone] (exp. 06/16/20 to 07/16/20) faxed info to [facility] for is pacemaker. If safe they will reach out to the patient to schedule.

## 2020-06-16 NOTE — Telephone Encounter (Signed)
Another physician already ordered the MRI Lumbar per his email to me

## 2020-06-17 DIAGNOSIS — M86132 Other acute osteomyelitis, left radius and ulna: Secondary | ICD-10-CM | POA: Diagnosis not present

## 2020-06-17 DIAGNOSIS — A4901 Methicillin susceptible Staphylococcus aureus infection, unspecified site: Secondary | ICD-10-CM | POA: Diagnosis not present

## 2020-06-17 DIAGNOSIS — M7022 Olecranon bursitis, left elbow: Secondary | ICD-10-CM | POA: Diagnosis not present

## 2020-06-17 DIAGNOSIS — M869 Osteomyelitis, unspecified: Secondary | ICD-10-CM | POA: Diagnosis not present

## 2020-06-17 NOTE — Telephone Encounter (Signed)
Scheduled at [facility] for 06/24/20.

## 2020-06-23 ENCOUNTER — Encounter: Payer: Self-pay | Admitting: Infectious Disease

## 2020-06-23 DIAGNOSIS — A4901 Methicillin susceptible Staphylococcus aureus infection, unspecified site: Secondary | ICD-10-CM | POA: Diagnosis not present

## 2020-06-23 DIAGNOSIS — M869 Osteomyelitis, unspecified: Secondary | ICD-10-CM | POA: Diagnosis not present

## 2020-06-23 DIAGNOSIS — B9561 Methicillin susceptible Staphylococcus aureus infection as the cause of diseases classified elsewhere: Secondary | ICD-10-CM | POA: Diagnosis not present

## 2020-06-23 DIAGNOSIS — M7022 Olecranon bursitis, left elbow: Secondary | ICD-10-CM | POA: Diagnosis not present

## 2020-06-23 DIAGNOSIS — M86132 Other acute osteomyelitis, left radius and ulna: Secondary | ICD-10-CM | POA: Diagnosis not present

## 2020-06-24 ENCOUNTER — Other Ambulatory Visit: Payer: Self-pay

## 2020-06-24 ENCOUNTER — Ambulatory Visit (HOSPITAL_COMMUNITY)
Admission: RE | Admit: 2020-06-24 | Discharge: 2020-06-24 | Disposition: A | Discharge status: Home / Self Care | Payer: Medicare PPO | Source: Ambulatory Visit | Attending: Neurology | Admitting: Neurology

## 2020-06-24 ENCOUNTER — Encounter (HOSPITAL_COMMUNITY): Payer: Self-pay

## 2020-06-24 ENCOUNTER — Ambulatory Visit (HOSPITAL_COMMUNITY)
Admission: RE | Admit: 2020-06-24 | Discharge: 2020-06-24 | Disposition: A | Discharge status: Home / Self Care | Payer: Medicare PPO | Source: Ambulatory Visit | Attending: Infectious Disease | Admitting: Infectious Disease

## 2020-06-24 DIAGNOSIS — M869 Osteomyelitis, unspecified: Secondary | ICD-10-CM | POA: Diagnosis not present

## 2020-06-24 DIAGNOSIS — H5460 Unqualified visual loss, one eye, unspecified: Secondary | ICD-10-CM | POA: Insufficient documentation

## 2020-06-24 DIAGNOSIS — H5461 Unqualified visual loss, right eye, normal vision left eye: Secondary | ICD-10-CM | POA: Insufficient documentation

## 2020-06-24 DIAGNOSIS — J322 Chronic ethmoidal sinusitis: Secondary | ICD-10-CM | POA: Diagnosis not present

## 2020-06-24 DIAGNOSIS — R29818 Other symptoms and signs involving the nervous system: Secondary | ICD-10-CM | POA: Insufficient documentation

## 2020-06-24 DIAGNOSIS — A4901 Methicillin susceptible Staphylococcus aureus infection, unspecified site: Secondary | ICD-10-CM | POA: Diagnosis not present

## 2020-06-24 DIAGNOSIS — M7022 Olecranon bursitis, left elbow: Secondary | ICD-10-CM | POA: Diagnosis not present

## 2020-06-24 DIAGNOSIS — J3489 Other specified disorders of nose and nasal sinuses: Secondary | ICD-10-CM | POA: Diagnosis not present

## 2020-06-24 DIAGNOSIS — R29898 Other symptoms and signs involving the musculoskeletal system: Secondary | ICD-10-CM | POA: Diagnosis not present

## 2020-06-24 DIAGNOSIS — M86132 Other acute osteomyelitis, left radius and ulna: Secondary | ICD-10-CM | POA: Diagnosis not present

## 2020-06-24 DIAGNOSIS — J0120 Acute ethmoidal sinusitis, unspecified: Secondary | ICD-10-CM | POA: Diagnosis not present

## 2020-06-24 IMAGING — MR MR HEAD WO/W CM
19 of 22 series · 39 of 48 positions shown · IV contrast (gadavist)
Comparison: Brain MRI [DATE].  Head CT [DATE].

CLINICAL DATA: Neuro deficit, acute, stroke suspected. Additional
history provided by scanning technologist: Patient reports pain,
numbness and weakness in both legs for 6 weeks with difficulty
walking.

EXAM:
MRI HEAD WITHOUT AND WITH CONTRAST
TECHNIQUE: Multiplanar, multiecho pulse sequences of the brain and surrounding
structures were obtained without and with intravenous contrast.
CONTRAST:  9mL GADAVIST GADOBUTROL 1 MMOL/ML IV SOLN

[Series 5: ax dwi_tracew · axial · 4.0mm · 0.57mm/px · z∈[-31,+111]mm · 3 of 76 slices shown]
[im 1/76]
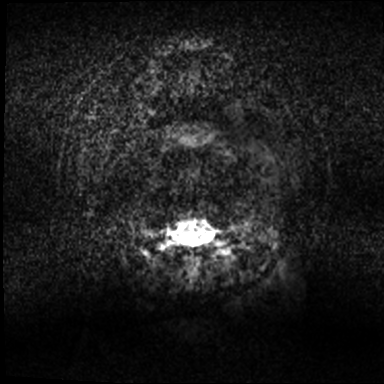
[im 38/76]
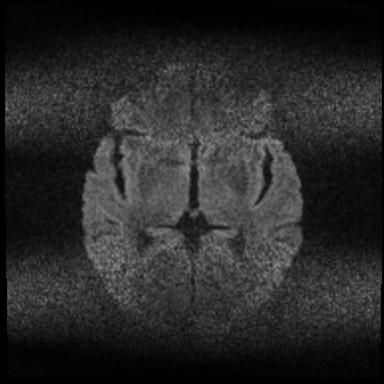
[im 76/76]
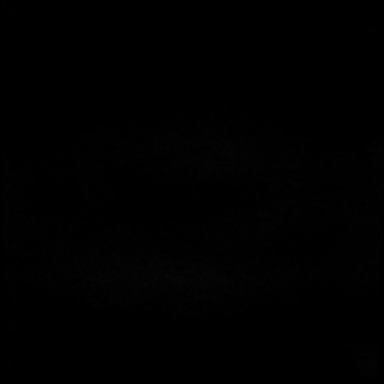

[Series 6: ax dwi_adc · axial · 4.0mm · 0.57mm/px · z∈[-31,+107]mm · 2 of 37 slices shown]
[im 1/37]
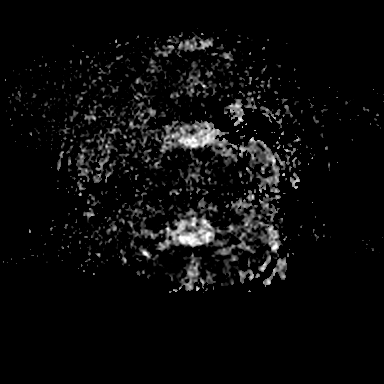
[im 37/37]
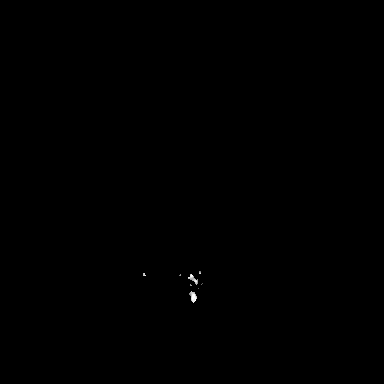

[Series 7: cor dwi_tracew · coronal · 4.0mm · 0.78mm/px · 3 of 71 slices shown]
[im 1/71]
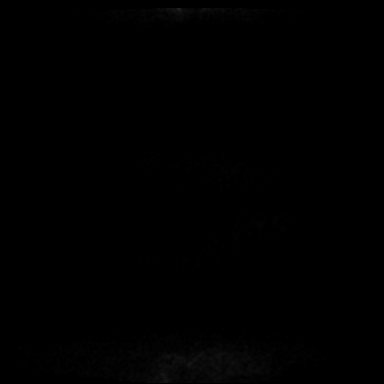
[im 36/71]
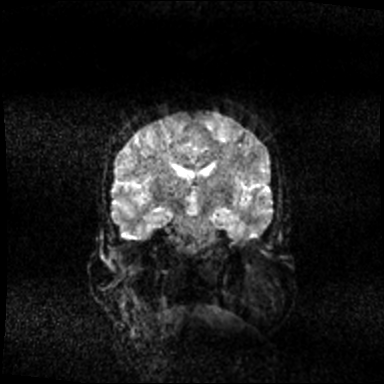
[im 71/71]
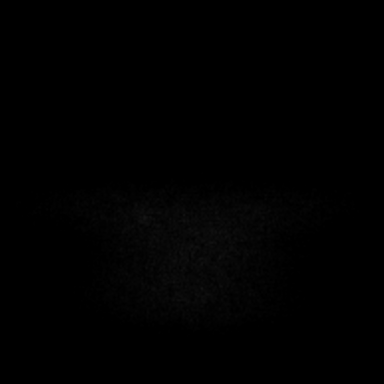

[Series 8: cor dwi_adc · coronal · 4.0mm · 0.78mm/px · 2 of 36 slices shown]
[im 1/36]
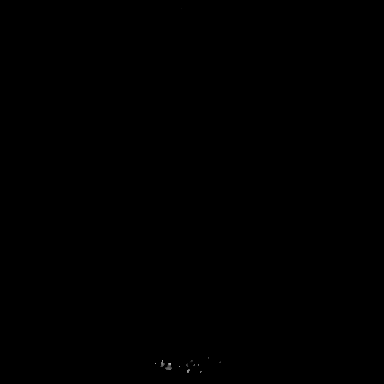
[im 36/36]
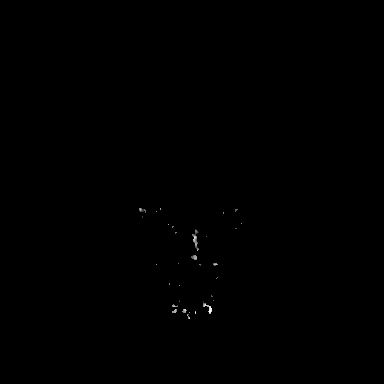

[Series 9: DWI · axial · 3.0mm · 0.88mm/px · z∈[-25,+109]mm · 5 of 96 slices shown (1 of 4)]
[im 1/96]
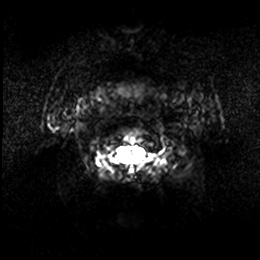
[im 24/96]
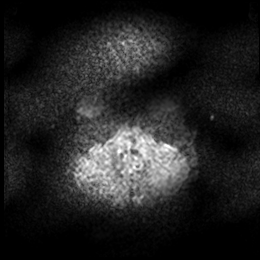
[im 48/96]
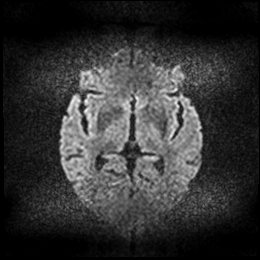
[im 72/96]
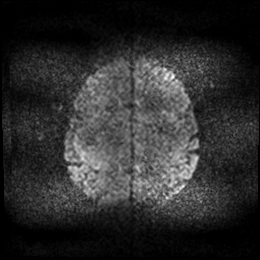
[im 96/96]
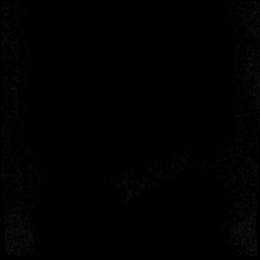

[Series 10: DWI · axial · 3.0mm · 0.88mm/px · z∈[-25,+109]mm · 2 of 48 slices shown (2 of 4)]
[im 1/48]
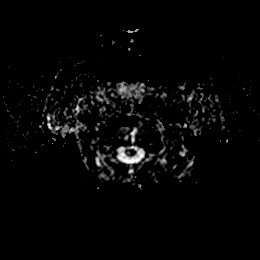
[im 48/48]
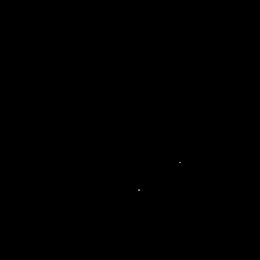

[Series 11: DWI · coronal · 4.0mm · 0.88mm/px · 3 of 68 slices shown (3 of 4)]
[im 1/68]
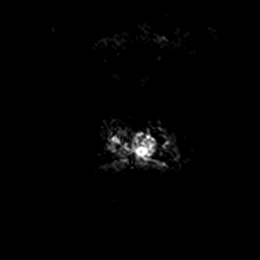
[im 34/68]
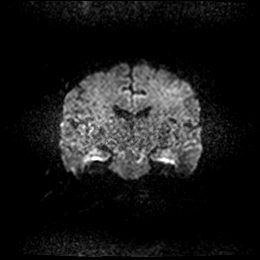
[im 68/68]
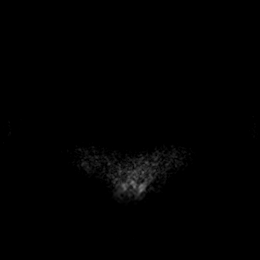

[Series 12: DWI · coronal · 4.0mm · 0.88mm/px · 2 of 34 slices shown (4 of 4)]
[im 1/34]
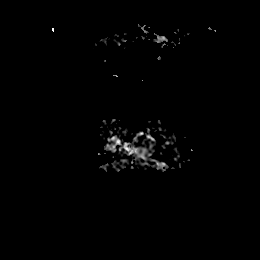
[im 34/34]
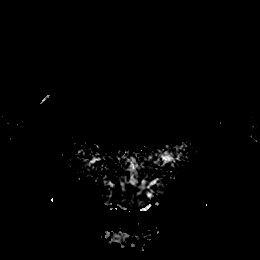

[Series 13: T1 · sagittal · 5.0mm · 0.75mm/px · 1 of 23 slices shown]
[im 1/23]
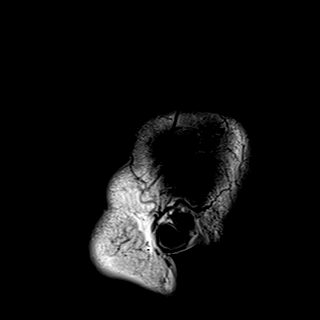

[Series 14: T2 · axial · 5.0mm · 0.72mm/px · 1 of 23 slices shown (1 of 2)]
[im 1/23]
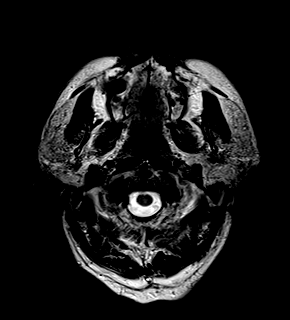

[Series 15: FLAIR · axial · 5.0mm · 0.72mm/px · 1 of 23 slices shown]
[im 1/23]
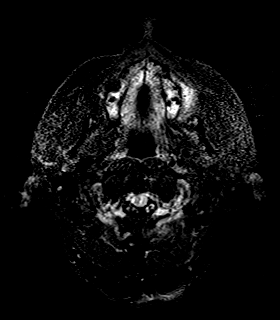

[Series 16: mag_images · axial · 3.0mm · 0.90mm/px · 1 of 16 slices shown]
[im 1/16]
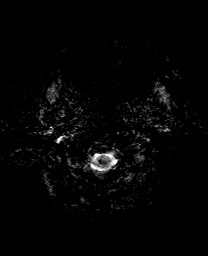

[Series 17: pha_images · axial · 3.0mm · 0.90mm/px · z∈[-23,+121]mm · 3 of 52 slices shown]
[im 1/52]
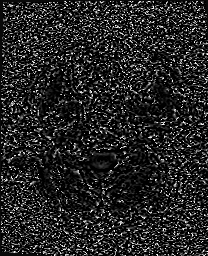
[im 26/52]
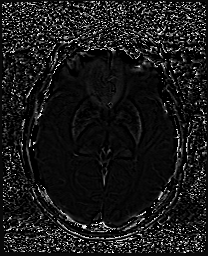
[im 52/52]
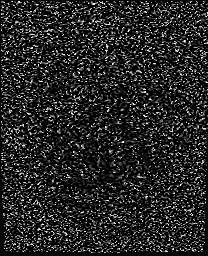

[Series 18: swi_images_nonorm · axial · 3.0mm · 0.90mm/px · z∈[-23,+121]mm · 3 of 52 slices shown]
[im 1/52]
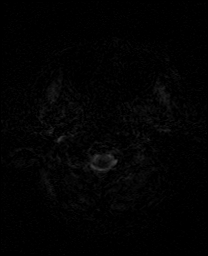
[im 26/52]
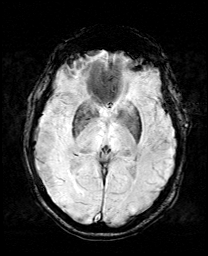
[im 52/52]
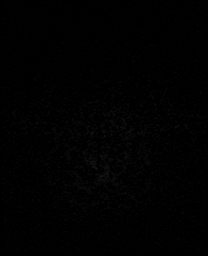

[Series 19: mag_images_nonorm · axial · 3.0mm · 0.90mm/px · z∈[+22,+121]mm · 2 of 36 slices shown]
[im 1/36]
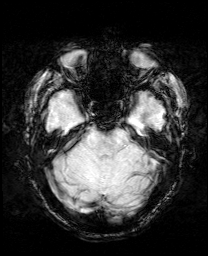
[im 36/36]
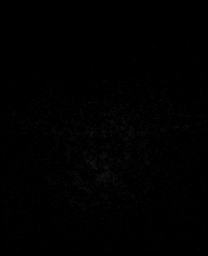

[Series 20: mip_images(sw) · axial · 24.0mm · 0.90mm/px · z∈[-13,+111]mm · 2 of 45 slices shown]
[im 1/45]
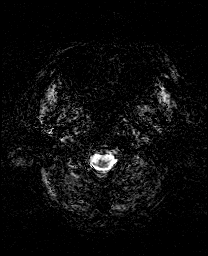
[im 45/45]
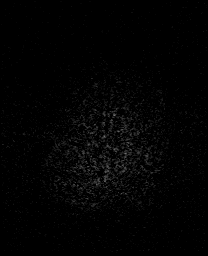

[Series 23: T2 · coronal · 5.0mm · 0.34mm/px · 1 of 29 slices shown (2 of 2)]
[im 1/29]
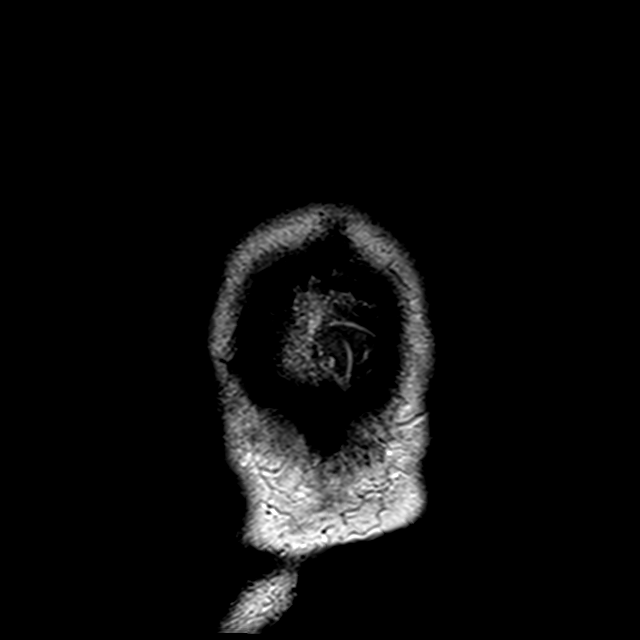

[Series 25: T1 post-contrast · coronal · 5.0mm · 0.34mm/px · 1 of 29 slices shown (1 of 2)]
[im 1/29]
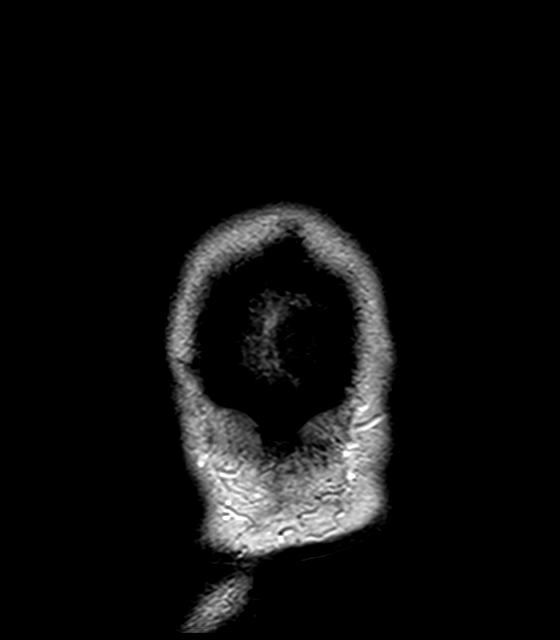

[Series 26: T1 post-contrast · sagittal · 5.0mm · 0.72mm/px · 1 of 23 slices shown (2 of 2)]
[im 1/23]
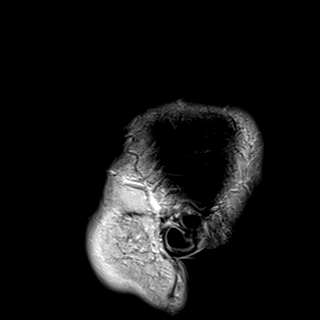

[39 of 48 positions shown; findings below may reference images not displayed]

FINDINGS: Brain:

The patient has an MR conditional neurostimulator, which limits
imaging quality and results in significant artifact on multiple
sequences. The diffusion-weighted, axial T1 pre contrast, coronal T2
weighted and T1 weighted postcontrast sequences are most notably
affected.

Cerebral volume is normal.

The described limitations, no focal parenchymal signal abnormality
or abnormal intracranial enhancement is identified.

There is no acute infarct.

No evidence of intracranial mass.

No chronic intracranial blood products.

No extra-axial fluid collection.

No midline shift.

Vascular: Expected proximal arterial flow voids.

Skull and upper cervical spine: No focal marrow lesion.

Sinuses/Orbits: Visualized orbits show no acute finding. Mild
ethmoid sinus mucosal thickening.
IMPRESSION: The patient has an MR conditional neurostimulator, which limits
imaging quality and results in significant artifact on multiple
sequences (as described).

Within this limitation, there is an unremarkable MRI appearance of
the brain. No evidence of acute intracranial abnormality.

Mild ethmoid sinus mucosal thickening.

## 2020-06-24 MED ORDER — GADOBUTROL 1 MMOL/ML IV SOLN
9.0000 mL | Freq: Once | INTRAVENOUS | Status: AC | PRN
  Administered 2020-06-24: 9 mL via INTRAVENOUS

## 2020-06-25 DIAGNOSIS — M316 Other giant cell arteritis: Secondary | ICD-10-CM | POA: Diagnosis not present

## 2020-06-25 DIAGNOSIS — A4901 Methicillin susceptible Staphylococcus aureus infection, unspecified site: Secondary | ICD-10-CM | POA: Diagnosis not present

## 2020-06-25 DIAGNOSIS — Z79899 Other long term (current) drug therapy: Secondary | ICD-10-CM | POA: Diagnosis not present

## 2020-06-28 ENCOUNTER — Telehealth: Payer: Self-pay

## 2020-06-28 NOTE — Telephone Encounter (Signed)
Received call from patient's wife Kyle [name] regarding her MyChart message. RN advised Kyle [name] to please have the patient's rheumatologist fax [place] a request for his labs, RN provided Kyle [name] with triage fax number.   Informed Kyle [name] that our last labs we have for the patient are from 12/8 and that Dr. Daiva Eves would like to extend his antibiotics until 07/08/20. Advised her that RN has made home health aware of the extension.   Kyle [name] asking if antibiotics are working. RN advised her that per our pharmacist, Cassie, the patient's labs look to be improving. Kyle [name] verbalized understanding and has no further questions at this time.   Sandie Ano, RN

## 2020-06-28 NOTE — Telephone Encounter (Signed)
Spoke with Tim at [facility] to relay verbal orders per Dr. Daiva Eves to extend the patient's antibiotics through 07/08/20. Orders repeated and verified.   Sandie Ano, RN

## 2020-06-28 NOTE — Telephone Encounter (Signed)
error 

## 2020-06-29 DIAGNOSIS — G5622 Lesion of ulnar nerve, left upper limb: Secondary | ICD-10-CM | POA: Diagnosis not present

## 2020-06-30 ENCOUNTER — Ambulatory Visit: Payer: Self-pay | Admitting: Neurology

## 2020-06-30 DIAGNOSIS — M7022 Olecranon bursitis, left elbow: Secondary | ICD-10-CM | POA: Diagnosis not present

## 2020-06-30 DIAGNOSIS — M86132 Other acute osteomyelitis, left radius and ulna: Secondary | ICD-10-CM | POA: Diagnosis not present

## 2020-06-30 DIAGNOSIS — M8610 Other acute osteomyelitis, unspecified site: Secondary | ICD-10-CM | POA: Diagnosis not present

## 2020-06-30 DIAGNOSIS — A4901 Methicillin susceptible Staphylococcus aureus infection, unspecified site: Secondary | ICD-10-CM | POA: Diagnosis not present

## 2020-06-30 DIAGNOSIS — M869 Osteomyelitis, unspecified: Secondary | ICD-10-CM | POA: Diagnosis not present

## 2020-06-30 DIAGNOSIS — B9562 Methicillin resistant Staphylococcus aureus infection as the cause of diseases classified elsewhere: Secondary | ICD-10-CM | POA: Diagnosis not present

## 2020-07-01 DIAGNOSIS — M86132 Other acute osteomyelitis, left radius and ulna: Secondary | ICD-10-CM | POA: Diagnosis not present

## 2020-07-01 DIAGNOSIS — M7022 Olecranon bursitis, left elbow: Secondary | ICD-10-CM | POA: Diagnosis not present

## 2020-07-01 DIAGNOSIS — M869 Osteomyelitis, unspecified: Secondary | ICD-10-CM | POA: Diagnosis not present

## 2020-07-01 DIAGNOSIS — A4901 Methicillin susceptible Staphylococcus aureus infection, unspecified site: Secondary | ICD-10-CM | POA: Diagnosis not present

## 2020-07-06 DIAGNOSIS — M7022 Olecranon bursitis, left elbow: Secondary | ICD-10-CM | POA: Diagnosis not present

## 2020-07-06 DIAGNOSIS — M869 Osteomyelitis, unspecified: Secondary | ICD-10-CM | POA: Diagnosis not present

## 2020-07-06 DIAGNOSIS — M86132 Other acute osteomyelitis, left radius and ulna: Secondary | ICD-10-CM | POA: Diagnosis not present

## 2020-07-06 DIAGNOSIS — A4901 Methicillin susceptible Staphylococcus aureus infection, unspecified site: Secondary | ICD-10-CM | POA: Diagnosis not present

## 2020-07-08 ENCOUNTER — Encounter: Payer: Self-pay | Admitting: Infectious Disease

## 2020-07-08 ENCOUNTER — Telehealth: Payer: Self-pay

## 2020-07-08 ENCOUNTER — Other Ambulatory Visit: Payer: Self-pay

## 2020-07-08 ENCOUNTER — Ambulatory Visit: Payer: Medicare PPO | Admitting: Infectious Disease

## 2020-07-08 VITALS — BP 149/112 | HR 90 | Temp 97.5°F | Wt 199.2 lb

## 2020-07-08 DIAGNOSIS — H53451 Other localized visual field defect, right eye: Secondary | ICD-10-CM | POA: Diagnosis not present

## 2020-07-08 DIAGNOSIS — R531 Weakness: Secondary | ICD-10-CM | POA: Diagnosis not present

## 2020-07-08 DIAGNOSIS — M316 Other giant cell arteritis: Secondary | ICD-10-CM | POA: Diagnosis not present

## 2020-07-08 DIAGNOSIS — A4901 Methicillin susceptible Staphylococcus aureus infection, unspecified site: Secondary | ICD-10-CM | POA: Diagnosis not present

## 2020-07-08 DIAGNOSIS — M86032 Acute hematogenous osteomyelitis, left radius and ulna: Secondary | ICD-10-CM

## 2020-07-08 HISTORY — DX: Weakness: R53.1

## 2020-07-08 MED ORDER — CEPHALEXIN 500 MG PO CAPS: 500.0000 mg | ORAL_CAPSULE | Freq: Four times a day (QID) | ORAL | 2 refills | Status: DC

## 2020-07-08 NOTE — Telephone Encounter (Signed)
Called [facility] with update that picc was pulled today during appointment. Spoke with Corrie Dandy who will update patient's chart. Lorenso Courier, [place]

## 2020-07-08 NOTE — Progress Notes (Signed)
Per Dr Daiva Eves 40 cm  Single lumen Peripherally Inserted Central Catheter  removed from left basilic. No sutures present. Dressing was clean and dry . Area cleansed with chlorhexidine and petroleum dressing applied. Pt advised no heavy lifting with this arm, leave dressing for 24 hours and call the office if dressing becomes soaked with blood or sharp pain presents.  Pt tolerated procedure well.    Laurell Josephs, RN

## 2020-07-08 NOTE — Progress Notes (Signed)
Chief complaint: left hip weakness and pain  Subjective:    Patient ID: Kyle [name], male    DOB: 1955/12/16, 64 y.o.   MRN: [id number]  HPI  Kyle [name] is a 64 year old Caucasian man with a past medical history significant for recently diagnosed temporal arteritis, corticosteroid use worsening his diabetes mellitus who had fallen 5 weeks prior to admission off of a ladder injuring his left elbow L elbow.  He been evaluated at [facility].  Ultimately was referred to Dr. Griffin Basil underwent CT scan that did not show a fracture.  He cannot have MRI due to his implanted spine stimulator.  He was taken to the operating room by Dr.Varkey with thought that this was a left triceps injury and elbow pain from ulnar neuritis.  However in the operating room in creatinine countered frank pus and abscess deep the triceps insertion.  He underwent olecranon bursectomy septic elbow incision and debridement triceps debridement debridement of osteomyelitis involving the proximal ulna as well as ulnar nerve decompression.  I was called by Dr. Griffin Basil and patient was placed on oral doxycycline and Augmentin in the interim.  Cultures of subsequently grown methicillin sensitive Staph aureus from operative cultures  We had picc placed and he was started on IV ancef.  He has now noticed over the past several weeks pain in left hip and weakness with leg nearly "giving out on me."  When I saw him last in clinic.  I was quite concerned this could represent lumbar spine pathology or brain pathology MRI of the brain was normal we are not able to obtain MRI of the spine due to his hips nerve stimulator.  I was going to pursue a CT scan but his leg weakness has disappeared.  He is also been seen by neurology.  His arm is hurting much less than it did before he does have an area distally that still is tender at times particularly if he touches it.  Extend his IV antibiotics through today.       Past  Medical History:  Diagnosis Date  . Anxiety   . Arm injury 2021   torn tendon and ligament in R arm; pending surgery   . Depression   . Diabetes ([place])   . High cholesterol   . Left leg weakness 06/14/2020  . Migraine   . MSSA (methicillin susceptible Staphylococcus aureus) infection 05/17/2020  . 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 Under care of Cyris Bakhit in [place] for pain management since 2000  . OSA (obstructive sleep apnea)    does not use CPAP  . 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 BACK SURGERY  1985  . CHOLECYSTECTOMY     2010?  [name] [place]  . OLECRANON BURSECTOMY Left 05/05/2020   Procedure: OLECRANON BURSECTOMY DEBRIDEMENT TRICEP, SEPTIC ELBOW IRRIGATION AND DEBRIDEMENT DEBRIDEMENT OF OSTEOMYLELITIS PROXIMAL ULNA;  Surgeon: Hiram Gash, MD;  Location: [facility];  Service: Orthopedics;  Laterality: Left;  . spinal stimulator implanted  2001  . spinal stimulator replaced  2011  . teeth removal  2020   2  . tooth removal  2020  . ULNAR NERVE TRANSPOSITION Left 05/05/2020   Procedure: ULNAR NERVE DECOMPRESSION/TRANSPOSITION;  Surgeon: Hiram Gash, MD;  Location: [facility];  Service: Orthopedics;  Laterality: Left;  Family History  Problem Relation Age of Onset  . Brain cancer Mother   . Heart disease Father   . Heart attack Father   . Diabetes Maternal Grandfather   . Cancer Paternal Grandmother       Social History   Socioeconomic History  . Marital status: Married    Spouse name: Murray Durrell  . Number of children: 2  . Years of education: 5  . Highest education level: Not on file  Occupational History  . Occupation: disabled  Tobacco Use  . Smoking status: Former Smoker    Quit date: 1987    Years since quitting: 35.0  . Smokeless tobacco: Never Used  . Tobacco  comment: 3-4 per day  Vaping Use  . Vaping Use: Never used  Substance and Sexual Activity  . Alcohol use: Yes    Comment: occasionally  . Drug use: Never  . Sexual activity: Not on file  Other Topics Concern  . Not on file  Social History Narrative   Lives at home with his wife   Right handed   Caffeine: 1-2 daily   Social Determinants of Health   Financial Resource Strain: Not on file  Food Insecurity: Not on file  Transportation Needs: Not on file  Physical Activity: Not on file  Stress: Not on file  Social Connections: Not on file    Allergies  Allergen Reactions  . Aspirin Anaphylaxis  . Ibuprofen Hives and Swelling     Current Outpatient Medications:  .  ACTEMRA ACTPEN 162 MG/0.9ML SOAJ, , Disp: , Rfl:  .  BD INSULIN SYRINGE U/F 31G X 5/16" 1 ML MISC, , Disp: , Rfl:  .  buPROPion (WELLBUTRIN XL) 150 MG 24 hr tablet, Take 150 mg by mouth 3 (three) times daily., Disp: , Rfl:  .  ceFAZolin (ANCEF) 10 g injection, , Disp: , Rfl:  .  divalproex (DEPAKOTE ER) 500 MG 24 hr tablet, Start with one pill at bedtime for 1 week then increase to 2 pills at bedtime., Disp: 30 tablet, Rfl: 11 .  famotidine (PEPCID) 20 MG tablet, , Disp: , Rfl:  .  gabapentin (NEURONTIN) 300 MG capsule, Take 2 capsules (600 mg total) by mouth 3 (three) times daily., Disp: 540 capsule, Rfl: 4 .  glipiZIDE (GLUCOTROL XL) 10 MG 24 hr tablet, Take 10 mg by mouth 2 (two) times daily., Disp: , Rfl:  .  insulin glargine (LANTUS) 100 UNIT/ML injection, Inject 20 Units into the skin 2 (two) times daily., Disp: , Rfl:  .  meloxicam (MOBIC) 15 MG tablet, , Disp: , Rfl:  .  naloxone (NARCAN) 4 MG/0.1ML LIQD nasal spray kit, Place into the nose., Disp: , Rfl:  .  omeprazole (PRILOSEC) 10 MG capsule, Take by mouth., Disp: , Rfl:  .  oxyCODONE-acetaminophen (PERCOCET/ROXICET) 5-325 MG tablet, Take 2 tablets by mouth 3 (three) times daily as needed. , Disp: , Rfl:  .  tiZANidine (ZANAFLEX) 2 MG tablet, Take 2 mg by  mouth 2 (two) times a day., Disp: , Rfl:  .  trazodone (DESYREL) 300 MG tablet, Take 300 mg by mouth at bedtime., Disp: , Rfl:  .  zolpidem (AMBIEN CR) 12.5 MG CR tablet, Take 12.5 mg by mouth at bedtime., Disp: , Rfl:    Review of Systems  Constitutional: Negative for activity change, appetite change, chills, diaphoresis, fatigue, fever and unexpected weight change.  HENT: Negative for congestion, rhinorrhea, sinus pressure, sneezing, sore throat and trouble swallowing.   Eyes: Negative  for photophobia and visual disturbance.  Respiratory: Negative for cough, chest tightness, shortness of breath, wheezing and stridor.   Cardiovascular: Negative for chest pain, palpitations and leg swelling.  Gastrointestinal: Negative for abdominal distention, abdominal pain, anal bleeding, blood in stool, constipation, diarrhea, nausea and vomiting.  Genitourinary: Negative for difficulty urinating, dysuria, flank pain and hematuria.  Musculoskeletal: Positive for arthralgias. Negative for back pain, gait problem, joint swelling and myalgias.  Skin: Positive for wound. Negative for color change, pallor and rash.  Neurological: Negative for dizziness, tremors, weakness and light-headedness.  Hematological: Negative for adenopathy. Does not bruise/bleed easily.  Psychiatric/Behavioral: Negative for agitation, behavioral problems, confusion, decreased concentration, dysphoric mood, hallucinations and sleep disturbance.       Objective:   Physical Exam Constitutional:      Appearance: He is well-developed.  HENT:     Head: Normocephalic and atraumatic.  Eyes:     Conjunctiva/sclera: Conjunctivae normal.  Cardiovascular:     Rate and Rhythm: Normal rate and regular rhythm.  Pulmonary:     Effort: Pulmonary effort is normal. No respiratory distress.     Breath sounds: No wheezing.  Abdominal:     General: There is no distension.     Palpations: Abdomen is soft.  Musculoskeletal:        General: No  tenderness. Normal range of motion.     Cervical back: Normal range of motion and neck supple.     Right hip: Normal.     Left hip: Normal.  Skin:    General: Skin is warm and dry.     Coloration: Skin is not pale.     Findings: No erythema or rash.  Neurological:     General: No focal deficit present.     Mental Status: He is alert and oriented to person, place, and time.     Motor: Motor function is intact.  Psychiatric:        Mood and Affect: Mood normal.        Behavior: Behavior normal.        Thought Content: Thought content normal.        Judgment: Judgment normal.    Left arm is healing up     PICC is clean          Assessment & Plan:    Septic olecranon bursitis and ulnar osteomyelitis status post surgery removal of bone and recovery of MSSA:  We will pull PICC line today but continue him on antibiotics in the form of Keflex 500 mg 4 times daily.  I have him return to clinic and see me in January and recheck inflammatory markers keeping in mind his diagnosis of temporal arteritis that could be a confounder  Leg weakness: This is resolved seems strange and wonder if it is in autoimmune phenomenon our was a TIA.  Would seem strange to be an infectious issue.  He does have some low back pain though which could make one think about discitis or vertebral osteomyelitis.  If he had such pathology certainly is getting protracted antibiotics  If this symptom returns I would want to get a CT of the spine and have him urgently referred to neurology  Temporal arteritis: Currently off corticosteroids due to complications associated with this.  Diabetes mellitus followed by PCP  I spent greater than 40 minutes with the patient including greater than 50% of time in face to face counsel of the patient hernias diagnosis of osteomyelitis how we monitor and treat this condition  guarded his leg weakness temporal arteritis and in coordination of his care.

## 2020-07-22 ENCOUNTER — Ambulatory Visit (INDEPENDENT_AMBULATORY_CARE_PROVIDER_SITE_OTHER): Payer: Medicare PPO | Admitting: Neurology

## 2020-07-22 DIAGNOSIS — G43709 Chronic migraine without aura, not intractable, without status migrainosus: Secondary | ICD-10-CM | POA: Diagnosis not present

## 2020-07-22 MED ORDER — ATOGEPANT 60 MG PO TABS: 60.0000 mg | ORAL_TABLET | Freq: Every day | ORAL | 0 refills | Status: DC

## 2020-07-22 NOTE — Progress Notes (Unsigned)
Nerve block w/o steroid: Pt signed consent  0.5% Bupivocaine 6 mL LOT: [id number] EXP: 11/2021 NDC: 91638-466-59  2% Lidocaine 6 mL LOT: [id number] EXP: 09/2021 NDC: 39030-092-33

## 2020-07-22 NOTE — Progress Notes (Signed)
Today pain went from 8/10 to a 0/10, tried multiple avenues, oral headache medicatons, CGRPs, botox , nothing helps like nerve blocks tried ubrelvy and nurtec - failed multiple procedures and medications. Complicated by righ temporal arteritis. Pain is migrainous and centered around his right eye. Try Qulipta.   The right side of the head, behind the eye and periorbital and temple, pounding, light and sound sensitivity, nausea. No autonomic symptoms. Ajovy and Botox didn't help. Try Qulipta.    Meds ordered this encounter  Medications  . Atogepant 60 MG TABS    Sig: Take 60 mg by mouth daily.    Dispense:  32 tablet    Refill:  0    [id number] 01/2022   Performed by Dr. Lucia Gaskins M.D. 30-gauge needle was used. All procedures a documented blood were medically necessary, reasonable and appropriate based on the patient's history, medical diagnosis and physician opinion. Verbal informed consent was obtained from the patient, patient was informed of potential risk of procedure, including bruising, bleeding, hematoma formation, infection, muscle weakness, muscle pain, numbness, transient hypertension, transient hyperglycemia and transient insomnia among others. All areas injected were topically clean with isopropyl rubbing alcohol. Nonsterile nonlatex gloves were worn during the procedure.  1. Auriculotemporal nerve block (66294): The Auriculotemporal nerve site was identified along the posterior margin of the sternocleidomastoid muscle toward the base of the ear. Medication was injected into the right radicular temporal nerve areas. Patient's condition is associated with inflammation of the Auriculotemporal Nerve and associated muscle groups. Injection was deemed medically necessary, reasonable and appropriate. Injection represents a separate and unique surgical service.  2. Supraorbital nerve block (64400): Supraorbital nerve site was identified along the incision of the frontal bone on the orbital/supraorbital  ridge. Medication was injected into the right supraorbital nerve areas. Patient's condition is associated with inflammation of the supraorbital and associated muscle groups. Injection was deemed medically necessary, reasonable and appropriate. Injection represents a separate and unique surgical service.

## 2020-07-22 NOTE — Patient Instructions (Signed)
May also consider trying Aimovig, Emg[employer] as well as [employer]

## 2020-07-26 ENCOUNTER — Encounter: Payer: Self-pay | Admitting: *Deleted

## 2020-07-26 DIAGNOSIS — H35063 Retinal vasculitis, bilateral: Secondary | ICD-10-CM | POA: Diagnosis not present

## 2020-07-26 DIAGNOSIS — H53453 Other localized visual field defect, bilateral: Secondary | ICD-10-CM | POA: Diagnosis not present

## 2020-07-29 ENCOUNTER — Encounter: Payer: Self-pay | Admitting: Infectious Disease

## 2020-07-29 ENCOUNTER — Ambulatory Visit (INDEPENDENT_AMBULATORY_CARE_PROVIDER_SITE_OTHER): Payer: Medicare PPO | Admitting: Infectious Disease

## 2020-07-29 ENCOUNTER — Other Ambulatory Visit: Payer: Self-pay

## 2020-07-29 VITALS — BP 131/84 | HR 76 | Temp 97.9°F | Wt 196.0 lb

## 2020-07-29 DIAGNOSIS — A4901 Methicillin susceptible Staphylococcus aureus infection, unspecified site: Secondary | ICD-10-CM

## 2020-07-29 DIAGNOSIS — M545 Low back pain, unspecified: Secondary | ICD-10-CM | POA: Diagnosis not present

## 2020-07-29 DIAGNOSIS — M316 Other giant cell arteritis: Secondary | ICD-10-CM | POA: Diagnosis not present

## 2020-07-29 DIAGNOSIS — M86032 Acute hematogenous osteomyelitis, left radius and ulna: Secondary | ICD-10-CM

## 2020-07-29 HISTORY — DX: Low back pain, unspecified: M54.50

## 2020-07-29 MED ORDER — CEPHALEXIN 500 MG PO CAPS: 500.0000 mg | ORAL_CAPSULE | Freq: Four times a day (QID) | ORAL | 2 refills | Status: AC

## 2020-07-29 NOTE — Progress Notes (Signed)
Chief complaint: Left hip pain  Subjective:    Patient ID: Kyle [name], male    DOB: 1955/12/16, 65 y.o.   MRN: [id number]  HPI  Kyle [name] is a 65 year old Caucasian man with a past medical history significant for recently diagnosed temporal arteritis, corticosteroid use worsening his diabetes mellitus who had fallen 5 weeks prior to admission off of a ladder injuring his left elbow L elbow.  He been evaluated at [facility].  Ultimately was referred to Dr. Griffin Basil underwent CT scan that did not show a fracture.  He cannot have MRI due to his implanted spine stimulator.  He was taken to the operating room by Dr.Varkey with thought that this was a left triceps injury and elbow pain from ulnar neuritis.  However in the operating room in creatinine countered frank pus and abscess deep the triceps insertion.  He underwent olecranon bursectomy septic elbow incision and debridement triceps debridement debridement of osteomyelitis involving the proximal ulna as well as ulnar nerve decompression.  I was called by Dr. Griffin Basil and patient was placed on oral doxycycline and Augmentin in the interim.  Cultures of subsequently grown methicillin sensitive Staph aureus from operative cultures  We had picc placed and he was started on IV ancef.  He had in the interim noticed over the past several weeks pain in left hip and weakness with leg nearly "giving out on me."  When I saw him last in clinicI was quite concerned this could represent lumbar spine pathology or brain pathology MRI of the brain was normal we are not able to obtain MRI of the spine due to his hips nerve stimulator.  I was going to pursue a CT scan but his leg weakness has disappeared.  In talking to him today though his pain never disappeared and in fact has been worsening in the hip and lower back.  I talked to his neuro-ophthalmology specialist at [facility] and she was concerned about the patient's  progressive vision loss in the context of not being on MS suppressive therapy.  I was supportive of him initiating immunosuppressive therapy to help salvage his vision as long as he remains on antimicrobial therapy in the interim.  He is just now started Actemra today + oral steroids.  Clearly we do need to image his lower lumbar area as he could have an infection at that site given the persistent pain.      Past Medical History:  Diagnosis Date  . Anxiety   . Arm injury 2021   torn tendon and ligament in R arm; pending surgery   . Depression   . Diabetes ([place])   . High cholesterol   . Left leg weakness 06/14/2020  . Left-sided weakness 07/08/2020  . Migraine   . MSSA (methicillin susceptible Staphylococcus aureus) infection 05/17/2020  . 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 Under care of Cyris Bakhit in [place] for pain management since 2000  . OSA (obstructive sleep apnea)    does not use CPAP  . Osteomyelitis of left ulna ([place]) 05/17/2020    Past Surgical History:  Procedure Laterality Date  . BACK SURGERY  1985  . CHOLECYSTECTOMY     2010?  [name] [place]  . OLECRANON BURSECTOMY Left 05/05/2020   Procedure: OLECRANON BURSECTOMY DEBRIDEMENT TRICEP, SEPTIC ELBOW IRRIGATION AND DEBRIDEMENT DEBRIDEMENT OF OSTEOMYLELITIS PROXIMAL ULNA;  Surgeon: Hiram Gash,  MD;  Location: [facility];  Service: Orthopedics;  Laterality: Left;  . spinal stimulator implanted  2001  . spinal stimulator replaced  2011  . teeth removal  2020   2  . tooth removal  2020  . ULNAR NERVE TRANSPOSITION Left 05/05/2020   Procedure: ULNAR NERVE DECOMPRESSION/TRANSPOSITION;  Surgeon: Hiram Gash, MD;  Location: [facility];  Service: Orthopedics;  Laterality: Left;    Family History  Problem Relation Age of Onset  . Brain cancer Mother   . Heart disease Father   . Heart attack Father    . Diabetes Maternal Grandfather   . Cancer Paternal Grandmother       Social History   Socioeconomic History  . Marital status: Married    Spouse name: Kyle [name]  . Number of children: 2  . Years of education: 88  . Highest education level: Not on file  Occupational History  . Occupation: disabled  Tobacco Use  . Smoking status: Former Smoker    Quit date: 1987    Years since quitting: 35.0  . Smokeless tobacco: Never Used  . Tobacco comment: 3-4 per day  Vaping Use  . Vaping Use: Never used  Substance and Sexual Activity  . Alcohol use: Yes    Comment: occasionally  . Drug use: Never  . Sexual activity: Not on file  Other Topics Concern  . Not on file  Social History Narrative   Lives at home with his wife   Right handed   Caffeine: 1-2 daily   Social Determinants of Health   Financial Resource Strain: Not on file  Food Insecurity: Not on file  Transportation Needs: Not on file  Physical Activity: Not on file  Stress: Not on file  Social Connections: Not on file    Allergies  Allergen Reactions  . Aspirin Anaphylaxis  . Ibuprofen Hives and Swelling     Current Outpatient Medications:  .  ACTEMRA ACTPEN 162 MG/0.9ML SOAJ, , Disp: , Rfl:  .  Atogepant 60 MG TABS, Take 60 mg by mouth daily., Disp: 32 tablet, Rfl: 0 .  BD INSULIN SYRINGE U/F 31G X 5/16" 1 ML MISC, , Disp: , Rfl:  .  buPROPion (WELLBUTRIN XL) 150 MG 24 hr tablet, Take 150 mg by mouth 3 (three) times daily., Disp: , Rfl:  .  cephALEXin (KEFLEX) 500 MG capsule, Take 1 capsule (500 mg total) by mouth 4 (four) times daily., Disp: 120 capsule, Rfl: 2 .  divalproex (DEPAKOTE ER) 500 MG 24 hr tablet, Start with one pill at bedtime for 1 week then increase to 2 pills at bedtime., Disp: 30 tablet, Rfl: 11 .  famotidine (PEPCID) 20 MG tablet, , Disp: , Rfl:  .  gabapentin (NEURONTIN) 300 MG capsule, Take 2 capsules (600 mg total) by mouth 3 (three) times daily., Disp: 540 capsule, Rfl: 4 .   glipiZIDE (GLUCOTROL XL) 10 MG 24 hr tablet, Take 10 mg by mouth 2 (two) times daily., Disp: , Rfl:  .  insulin glargine (LANTUS) 100 UNIT/ML injection, Inject 20 Units into the skin 2 (two) times daily., Disp: , Rfl:  .  meloxicam (MOBIC) 15 MG tablet, , Disp: , Rfl:  .  naloxone (NARCAN) 4 MG/0.1ML LIQD nasal spray kit, Place into the nose., Disp: , Rfl:  .  omeprazole (PRILOSEC) 10 MG capsule, Take by mouth., Disp: , Rfl:  .  oxyCODONE-acetaminophen (PERCOCET/ROXICET) 5-325 MG tablet, Take 2 tablets by mouth 3 (three) times daily  as needed. , Disp: , Rfl:  .  tiZANidine (ZANAFLEX) 2 MG tablet, Take 2 mg by mouth 2 (two) times a day., Disp: , Rfl:  .  trazodone (DESYREL) 300 MG tablet, Take 300 mg by mouth at bedtime., Disp: , Rfl:  .  zolpidem (AMBIEN CR) 12.5 MG CR tablet, Take 12.5 mg by mouth at bedtime., Disp: , Rfl:    Review of Systems  Constitutional: Negative for activity change, appetite change, chills, diaphoresis, fatigue, fever and unexpected weight change.  HENT: Negative for congestion, rhinorrhea, sinus pressure, sneezing, sore throat and trouble swallowing.   Eyes: Negative for photophobia and visual disturbance.  Respiratory: Negative for cough, chest tightness, shortness of breath, wheezing and stridor.   Cardiovascular: Negative for chest pain, palpitations and leg swelling.  Gastrointestinal: Negative for abdominal distention, abdominal pain, anal bleeding, blood in stool, constipation, diarrhea, nausea and vomiting.  Genitourinary: Negative for difficulty urinating, dysuria, flank pain and hematuria.  Musculoskeletal: Positive for arthralgias. Negative for back pain, gait problem, joint swelling and myalgias.  Skin: Positive for wound. Negative for color change, pallor and rash.  Neurological: Negative for dizziness, tremors, weakness and light-headedness.  Hematological: Negative for adenopathy. Does not bruise/bleed easily.  Psychiatric/Behavioral: Negative for  agitation, behavioral problems, confusion, decreased concentration, dysphoric mood, hallucinations and sleep disturbance.       Objective:   Physical Exam Constitutional:      Appearance: He is well-developed.  HENT:     Head: Normocephalic and atraumatic.  Eyes:     Conjunctiva/sclera: Conjunctivae normal.  Cardiovascular:     Rate and Rhythm: Normal rate and regular rhythm.  Pulmonary:     Effort: Pulmonary effort is normal. No respiratory distress.     Breath sounds: No wheezing.  Abdominal:     General: There is no distension.     Palpations: Abdomen is soft.  Musculoskeletal:        General: Normal range of motion.     Cervical back: Normal range of motion and neck supple.     Lumbar back: Tenderness present.       Back:     Right hip: Normal. No tenderness. Normal range of motion.     Left hip: Normal. No tenderness. Normal range of motion.  Skin:    General: Skin is warm and dry.     Coloration: Skin is not pale.     Findings: No erythema or rash.  Neurological:     General: No focal deficit present.     Mental Status: He is alert and oriented to person, place, and time.     Motor: Motor function is intact.  Psychiatric:        Mood and Affect: Mood normal.        Behavior: Behavior normal.        Thought Content: Thought content normal.        Judgment: Judgment normal.    Left arm is healing well and nontender           Assessment & Plan:    Septic olecranon bursitis and ulnar osteomyelitis status post surgery removal of bone and recovery of MSSA:  We will keep him on cephalexin given that he is starting on immunosuppressive therapy.  Will check sed rate CRP BMP and CBC  Left hip pain: We will get a CT of the lumbosacral spine   Temporal arteritis: Starting Actemra and steroid

## 2020-07-30 LAB — CBC WITH DIFFERENTIAL/PLATELET
Absolute Monocytes: 409 cells/uL (ref 200–950)
Basophils Absolute: 81 cells/uL (ref 0–200)
Basophils Relative: 1.3 %
Eosinophils Absolute: 186 cells/uL (ref 15–500)
Eosinophils Relative: 3 %
HCT: 46.5 % (ref 38.5–50.0)
Hemoglobin: 15.9 g/dL (ref 13.2–17.1)
Lymphs Abs: 2027 cells/uL (ref 850–3900)
MCH: 30.6 pg (ref 27.0–33.0)
MCHC: 34.2 g/dL (ref 32.0–36.0)
MCV: 89.6 fL (ref 80.0–100.0)
MPV: 9 fL (ref 7.5–12.5)
Monocytes Relative: 6.6 %
Neutro Abs: 3497 cells/uL (ref 1500–7800)
Neutrophils Relative %: 56.4 %
Platelets: 424 10*3/uL — ABNORMAL HIGH (ref 140–400)
RBC: 5.19 10*6/uL (ref 4.20–5.80)
RDW: 11.6 % (ref 11.0–15.0)
Total Lymphocyte: 32.7 %
WBC: 6.2 10*3/uL (ref 3.8–10.8)

## 2020-07-30 LAB — C-REACTIVE PROTEIN: CRP: 1.2 mg/L (ref <8.0)

## 2020-07-30 LAB — BASIC METABOLIC PANEL WITH GFR
BUN: 14 mg/dL (ref 7–25)
CO2: 31 mmol/L (ref 20–32)
Calcium: 10 mg/dL (ref 8.6–10.3)
Chloride: 104 mmol/L (ref 98–110)
Creat: 0.93 mg/dL (ref 0.70–1.25)
GFR, Est African American: 99 mL/min/{1.73_m2} (ref >=60)
GFR, Est Non African American: 86 mL/min/{1.73_m2} (ref >=60)
Glucose, Bld: 93 mg/dL (ref 65–99)
Potassium: 4.8 mmol/L (ref 3.5–5.3)
Sodium: 140 mmol/L (ref 135–146)

## 2020-07-30 LAB — SEDIMENTATION RATE: Sed Rate: 2 mm/h (ref 0–20)

## 2020-08-16 DIAGNOSIS — H35063 Retinal vasculitis, bilateral: Secondary | ICD-10-CM | POA: Diagnosis not present

## 2020-08-18 ENCOUNTER — Telehealth: Payer: Self-pay | Admitting: *Deleted

## 2020-08-18 NOTE — Telephone Encounter (Signed)
New order sheet completed for Qulipta 60 mg PO daily w/ 11 refills. Order sheet signed by Dr Lucia Gaskins. Faxed form with copy of insurance card to [facility]. Received a receipt of confirmation.

## 2020-08-19 ENCOUNTER — Ambulatory Visit: Payer: Self-pay | Admitting: Neurology

## 2020-08-24 NOTE — Telephone Encounter (Signed)
We received a fax from [facility] stating they have thoroughly researched patient coverage options. I called [employer] to inquire and spoke with a representative who stated since the pt has Medicare, he cannot qualify for the bridge program. However they have forwarded his info to [employer] and they will assess his qualifications for their patient assistance. They will contact us if they need further information and they will contact the patient. Their number is [phone] opt [phone] if we or the patient needs to call.

## 2020-08-30 ENCOUNTER — Ambulatory Visit (HOSPITAL_COMMUNITY): Admission: RE | Admit: 2020-08-30 | Payer: Medicare PPO | Source: Ambulatory Visit

## 2020-09-08 ENCOUNTER — Ambulatory Visit: Payer: Medicare PPO | Admitting: Infectious Disease

## 2020-09-09 ENCOUNTER — Ambulatory Visit: Payer: Self-pay | Admitting: Neurology

## 2020-09-16 DIAGNOSIS — H35063 Retinal vasculitis, bilateral: Secondary | ICD-10-CM | POA: Diagnosis not present

## 2020-09-22 ENCOUNTER — Ambulatory Visit: Payer: Medicare PPO | Admitting: Neurology

## 2020-09-22 VITALS — BP 141/83 | HR 76

## 2020-09-22 DIAGNOSIS — G43711 Chronic migraine without aura, intractable, with status migrainosus: Secondary | ICD-10-CM

## 2020-09-22 DIAGNOSIS — G43001 Migraine without aura, not intractable, with status migrainosus: Secondary | ICD-10-CM | POA: Diagnosis not present

## 2020-09-22 DIAGNOSIS — G500 Trigeminal neuralgia: Secondary | ICD-10-CM

## 2020-09-22 DIAGNOSIS — M5481 Occipital neuralgia: Secondary | ICD-10-CM

## 2020-09-22 NOTE — Progress Notes (Signed)
Performed by Dr. Lucia Gaskins M.D. 30-gauge needle was used. All procedures a documented blood were medically necessary, reasonable and appropriate based on the patient's history, medical diagnosis and physician opinion. Verbal informed consent was obtained from the patient, patient was informed of potential risk of procedure, including bruising, bleeding, hematoma formation, infection, muscle weakness, muscle pain, numbness, transient hypertension, transient hyperglycemia and transient insomnia among others. All areas injected were topically clean with isopropyl rubbing alcohol. Nonsterile nonlatex gloves were worn during the procedure.  1. Greater occipital nerve block [id number]). The greater occipital nerve site was identified at the nuchal line medial to the occipital artery. Medication was injected into the right occipital nerve areas and suboccipital areas. Patient's condition is associated with inflammation of the greater occipital nerve and associated multiple groups. Injection was deemed medically necessary, reasonable and appropriate. Injection represents a separate and unique surgical service.  2. Supraorbital nerve block (64400): Supraorbital nerve site was identified along the incision of the frontal bone on the orbital/supraorbital ridge. Medication was injected into the left and right supraorbital nerve areas. Patient's condition is associated with inflammation of the supraorbital and associated muscle groups. Injection was deemed medically necessary, reasonable and appropriate. Injection represents a separate and unique surgical service.

## 2020-09-22 NOTE — Progress Notes (Signed)
Nerve block w/o steroid: Pt signed consent  0.5% Bupivocaine 6 mL LOT: [id number] EXP: 09/2021 NDC: 32122-482-50  2% Lidocaine 6 mL LOT: [id number] EXP: 09/2021 NDC: 16945-038-88

## 2020-09-30 ENCOUNTER — Encounter: Payer: Self-pay | Admitting: *Deleted

## 2020-10-04 DIAGNOSIS — H35061 Retinal vasculitis, right eye: Secondary | ICD-10-CM | POA: Diagnosis not present

## 2020-10-11 ENCOUNTER — Ambulatory Visit (INDEPENDENT_AMBULATORY_CARE_PROVIDER_SITE_OTHER): Payer: Medicare PPO | Admitting: Infectious Disease

## 2020-10-11 ENCOUNTER — Encounter: Payer: Self-pay | Admitting: Infectious Disease

## 2020-10-11 ENCOUNTER — Other Ambulatory Visit: Payer: Self-pay

## 2020-10-11 VITALS — BP 134/85 | HR 105 | Temp 97.7°F | Ht 73.0 in | Wt 187.0 lb

## 2020-10-11 DIAGNOSIS — K2100 Gastro-esophageal reflux disease with esophagitis, without bleeding: Secondary | ICD-10-CM

## 2020-10-11 DIAGNOSIS — R1310 Dysphagia, unspecified: Secondary | ICD-10-CM

## 2020-10-11 DIAGNOSIS — K219 Gastro-esophageal reflux disease without esophagitis: Secondary | ICD-10-CM | POA: Insufficient documentation

## 2020-10-11 DIAGNOSIS — M86032 Acute hematogenous osteomyelitis, left radius and ulna: Secondary | ICD-10-CM

## 2020-10-11 DIAGNOSIS — H35063 Retinal vasculitis, bilateral: Secondary | ICD-10-CM

## 2020-10-11 DIAGNOSIS — A4901 Methicillin susceptible Staphylococcus aureus infection, unspecified site: Secondary | ICD-10-CM | POA: Diagnosis not present

## 2020-10-11 DIAGNOSIS — H35069 Retinal vasculitis, unspecified eye: Secondary | ICD-10-CM

## 2020-10-11 HISTORY — DX: Dysphagia, unspecified: R13.10

## 2020-10-11 HISTORY — DX: Retinal vasculitis, unspecified eye: H35.069

## 2020-10-11 HISTORY — DX: Gastro-esophageal reflux disease without esophagitis: K21.9

## 2020-10-11 MED ORDER — FLUCONAZOLE 100 MG PO TABS: 100.0000 mg | ORAL_TABLET | Freq: Every day | ORAL | 0 refills | Status: DC

## 2020-10-11 MED ORDER — OMEPRAZOLE 20 MG PO CPDR: 20.0000 mg | DELAYED_RELEASE_CAPSULE | Freq: Two times a day (BID) | ORAL | 1 refills | Status: DC

## 2020-10-11 MED ORDER — CEPHALEXIN 500 MG PO CAPS: 1000.0000 mg | ORAL_CAPSULE | Freq: Two times a day (BID) | ORAL | 7 refills | Status: DC

## 2020-10-11 NOTE — Progress Notes (Signed)
Chief complaint: ~With residual pain in his elbow  Subjective:    Patient ID: Kyle [name], male    DOB: May 22, 1956, 65 y.o.   MRN: [id number]  HPI  Kyle [name] is a 65 year old Caucasian man with a past medical history significant for recently diagnosed temporal arteritis, corticosteroid use worsening his diabetes mellitus who had fallen 5 weeks prior to admission off of a ladder injuring his left elbow L elbow.  He been evaluated at [place].  Ultimately was referred to Dr. Griffin Basil underwent CT scan that did not show a fracture.  He cannot have MRI due to his implanted spine stimulator.  He was taken to the operating room by Dr.Varkey with thought that this was a left triceps injury and elbow pain from ulnar neuritis.  However in the operating room in creatinine countered frank pus and abscess deep the triceps insertion.  He underwent olecranon bursectomy septic elbow incision and debridement triceps debridement debridement of osteomyelitis involving the proximal ulna as well as ulnar nerve decompression.  I was called by Dr. Griffin Basil and patient was placed on oral doxycycline and Augmentin in the interim.  Cultures of subsequently grown methicillin sensitive Staph aureus from operative cultures  We had picc placed and he was started on IV ancef.  He had in the interim noticed over the past several weeks pain in left hip and weakness with leg nearly "giving out on me."  When I saw him last in clinicI was quite concerned this could represent lumbar spine pathology or brain pathology MRI of the brain was normal we are not able to obtain MRI of the spine due to his hips nerve stimulator.  I was going to pursue a CT scan but his leg weakness has disappeared.  In talking to him today though his pain never disappeared and in fact has been worsening in the hip and lower back.  I talked to his neuro-ophthalmology specialist at [place] and she was concerned  about the patient's progressive vision loss in the context of not being on MS suppressive therapy.  I was supportive of him initiating immunosuppressive therapy to help salvage his vision as long as he remains on antimicrobial therapy in the interim.  He is just now started Actemra today + oral steroids.  He feels that 1 eye is no longer having any improvement and the other one is slightly improved.  He does not appear to have had imaging of his L-spine.  He does mention that since being on the higher dose prednisone along with antibiotics is having what sounds like heartburn as well as dysphagia with sensation of something getting stuck in his esophagus.  He has been seen by ophthalmology at [facility] a few days ago roughly a week ago.  He continues to have pain in his elbow a specific spot that has not gone away but is nowhere near where the type of pain he was experiencing previously.  There was a confusion about his antibiotics when it appeared that he was on doxycycline 4 times a day but is on Keflex 4 times a day     Past Medical History:  Diagnosis Date  . Anxiety   . Arm injury 2021   torn tendon and ligament in R arm; pending surgery   . Depression   . Diabetes ([place])   . High cholesterol   . Left leg weakness 06/14/2020  . Left-sided weakness 07/08/2020  . Lower back pain 07/29/2020  . Migraine   .  MSSA (methicillin susceptible Staphylococcus aureus) infection 05/17/2020  . 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 Under care of [facility] Bakhit in [place] for pain management since 2000  . OSA (obstructive sleep apnea)    does not use CPAP  . Osteomyelitis of left ulna ([place]) 05/17/2020    Past Surgical History:  Procedure Laterality Date  . BACK SURGERY  1985  . CHOLECYSTECTOMY     2010?  [name] [place]  . OLECRANON BURSECTOMY Left 05/05/2020   Procedure: OLECRANON BURSECTOMY DEBRIDEMENT TRICEP,  SEPTIC ELBOW IRRIGATION AND DEBRIDEMENT DEBRIDEMENT OF OSTEOMYLELITIS PROXIMAL ULNA;  Surgeon: Hiram Gash, MD;  Location: [facility];  Service: Orthopedics;  Laterality: Left;  . spinal stimulator implanted  2001  . spinal stimulator replaced  2011  . teeth removal  2020   2  . tooth removal  2020  . ULNAR NERVE TRANSPOSITION Left 05/05/2020   Procedure: ULNAR NERVE DECOMPRESSION/TRANSPOSITION;  Surgeon: Hiram Gash, MD;  Location: [facility];  Service: Orthopedics;  Laterality: Left;    Family History  Problem Relation Age of Onset  . Brain cancer Mother   . Heart disease Father   . Heart attack Father   . Diabetes Maternal Grandfather   . Cancer Paternal Grandmother       Social History   Socioeconomic History  . Marital status: Married    Spouse name: Isidoro Santillana  . Number of children: 2  . Years of education: 73  . Highest education level: Not on file  Occupational History  . Occupation: disabled  Tobacco Use  . Smoking status: Former Smoker    Quit date: 1987    Years since quitting: 35.2  . Smokeless tobacco: Never Used  . Tobacco comment: 3-4 per day  Vaping Use  . Vaping Use: Never used  Substance and Sexual Activity  . Alcohol use: Yes    Comment: occasionally  . Drug use: Never  . Sexual activity: Not on file  Other Topics Concern  . Not on file  Social History Narrative   Lives at home with his wife   Right handed   Caffeine: 1-2 daily   Social Determinants of Health   Financial Resource Strain: Not on file  Food Insecurity: Not on file  Transportation Needs: Not on file  Physical Activity: Not on file  Stress: Not on file  Social Connections: Not on file    Allergies  Allergen Reactions  . Aspirin Anaphylaxis  . Ibuprofen Hives and Swelling     Current Outpatient Medications:  .  ACTEMRA ACTPEN 162 MG/0.9ML SOAJ, , Disp: , Rfl:  .  Atogepant 60 MG TABS, Take 60 mg by mouth daily., Disp: 32 tablet,  Rfl: 0 .  BD INSULIN SYRINGE U/F 31G X 5/16" 1 ML MISC, , Disp: , Rfl:  .  buPROPion (WELLBUTRIN XL) 150 MG 24 hr tablet, Take 150 mg by mouth 3 (three) times daily., Disp: , Rfl:  .  Calcium Carbonate Antacid (TUMS PO), Take by mouth as needed., Disp: , Rfl:  .  divalproex (DEPAKOTE ER) 500 MG 24 hr tablet, Start with one pill at bedtime for 1 week then increase to 2 pills at bedtime., Disp: 30 tablet, Rfl: 11 .  DOXYCYCLINE PO, Take by mouth in the morning, at noon, in the evening, and at bedtime., Disp: , Rfl:  .  famotidine (PEPCID) 20 MG  tablet, , Disp: , Rfl:  .  gabapentin (NEURONTIN) 300 MG capsule, Take 2 capsules (600 mg total) by mouth 3 (three) times daily., Disp: 540 capsule, Rfl: 4 .  glipiZIDE (GLUCOTROL XL) 10 MG 24 hr tablet, Take 10 mg by mouth 2 (two) times daily., Disp: , Rfl:  .  insulin glargine (LANTUS) 100 UNIT/ML injection, Inject 20 Units into the skin 2 (two) times daily., Disp: , Rfl:  .  naloxone (NARCAN) 4 MG/0.1ML LIQD nasal spray kit, Place into the nose., Disp: , Rfl:  .  oxyCODONE-acetaminophen (PERCOCET/ROXICET) 5-325 MG tablet, Take 2 tablets by mouth 3 (three) times daily as needed. , Disp: , Rfl:  .  predniSONE (DELTASONE) 20 MG tablet, Take 80 mg by mouth daily., Disp: , Rfl:  .  tiZANidine (ZANAFLEX) 2 MG tablet, Take 2 mg by mouth 2 (two) times a day., Disp: , Rfl:  .  trazodone (DESYREL) 300 MG tablet, Take 300 mg by mouth at bedtime., Disp: , Rfl:  .  zolpidem (AMBIEN CR) 12.5 MG CR tablet, Take 12.5 mg by mouth at bedtime., Disp: , Rfl:    Review of Systems  Constitutional: Negative for activity change, appetite change, chills, diaphoresis, fatigue, fever and unexpected weight change.  HENT: Negative for congestion, rhinorrhea, sinus pressure, sneezing, sore throat and trouble swallowing.   Eyes: Positive for visual disturbance. Negative for photophobia.  Respiratory: Negative for cough, chest tightness, shortness of breath, wheezing and stridor.    Cardiovascular: Negative for chest pain, palpitations and leg swelling.  Gastrointestinal: Negative for abdominal distention, abdominal pain, anal bleeding, blood in stool, constipation, diarrhea, nausea and vomiting.  Genitourinary: Negative for difficulty urinating, dysuria, flank pain and hematuria.  Musculoskeletal: Positive for arthralgias. Negative for back pain, gait problem, joint swelling and myalgias.  Skin: Positive for wound. Negative for color change, pallor and rash.  Neurological: Negative for dizziness, tremors, weakness and light-headedness.  Hematological: Negative for adenopathy. Does not bruise/bleed easily.  Psychiatric/Behavioral: Negative for agitation, behavioral problems, confusion, decreased concentration, dysphoric mood, hallucinations and sleep disturbance.       Objective:   Physical Exam Constitutional:      Appearance: He is well-developed.  HENT:     Head: Normocephalic and atraumatic.  Eyes:     Conjunctiva/sclera: Conjunctivae normal.  Cardiovascular:     Rate and Rhythm: Normal rate and regular rhythm.  Pulmonary:     Effort: Pulmonary effort is normal. No respiratory distress.     Breath sounds: No wheezing.  Abdominal:     General: There is no distension.     Palpations: Abdomen is soft.  Musculoskeletal:        General: Normal range of motion.     Left elbow: Tenderness present.     Cervical back: Normal range of motion and neck supple.     Lumbar back: Tenderness present.       Back:     Right hip: Normal. No tenderness. Normal range of motion.     Left hip: Normal. No tenderness. Normal range of motion.  Skin:    General: Skin is warm and dry.     Coloration: Skin is not pale.     Findings: No erythema or rash.  Neurological:     General: No focal deficit present.     Mental Status: He is alert and oriented to person, place, and time.     Motor: Motor function is intact.  Psychiatric:        Mood and Affect:  Mood normal.         Behavior: Behavior normal.        Thought Content: Thought content normal.        Judgment: Judgment normal.    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  On exam he had no thrush in his mouth

## 2020-10-12 LAB — CBC WITH DIFFERENTIAL/PLATELET
Absolute Monocytes: 603 cells/uL (ref 200–950)
Basophils Absolute: 118 cells/uL (ref 0–200)
Basophils Relative: 0.9 %
Eosinophils Absolute: 92 cells/uL (ref 15–500)
Eosinophils Relative: 0.7 %
HCT: 49.2 % (ref 38.5–50.0)
Hemoglobin: 16.5 g/dL (ref 13.2–17.1)
Lymphs Abs: 2646 cells/uL (ref 850–3900)
MCH: 30.3 pg (ref 27.0–33.0)
MCHC: 33.5 g/dL (ref 32.0–36.0)
MCV: 90.3 fL (ref 80.0–100.0)
MPV: 9.3 fL (ref 7.5–12.5)
Monocytes Relative: 4.6 %
Neutro Abs: 9642 cells/uL — ABNORMAL HIGH (ref 1500–7800)
Neutrophils Relative %: 73.6 %
Platelets: 272 10*3/uL (ref 140–400)
RBC: 5.45 10*6/uL (ref 4.20–5.80)
RDW: 13 % (ref 11.0–15.0)
Total Lymphocyte: 20.2 %
WBC: 13.1 10*3/uL — ABNORMAL HIGH (ref 3.8–10.8)

## 2020-10-12 LAB — BASIC METABOLIC PANEL WITH GFR
BUN: 18 mg/dL (ref 7–25)
CO2: 30 mmol/L (ref 20–32)
Calcium: 10.1 mg/dL (ref 8.6–10.3)
Chloride: 98 mmol/L (ref 98–110)
Creat: 1.07 mg/dL (ref 0.70–1.25)
GFR, Est African American: 84 mL/min/{1.73_m2} (ref >=60)
GFR, Est Non African American: 72 mL/min/{1.73_m2} (ref >=60)
Glucose, Bld: 219 mg/dL — ABNORMAL HIGH (ref 65–99)
Potassium: 4.7 mmol/L (ref 3.5–5.3)
Sodium: 138 mmol/L (ref 135–146)

## 2020-10-12 LAB — C-REACTIVE PROTEIN: CRP: 7.9 mg/L (ref <8.0)

## 2020-10-12 LAB — SEDIMENTATION RATE: Sed Rate: 2 mm/h (ref 0–20)

## 2020-10-14 DIAGNOSIS — H35063 Retinal vasculitis, bilateral: Secondary | ICD-10-CM | POA: Diagnosis not present

## 2020-10-22 DIAGNOSIS — H35061 Retinal vasculitis, right eye: Secondary | ICD-10-CM | POA: Diagnosis not present

## 2020-10-22 DIAGNOSIS — Z79899 Other long term (current) drug therapy: Secondary | ICD-10-CM | POA: Diagnosis not present

## 2020-10-22 DIAGNOSIS — M316 Other giant cell arteritis: Secondary | ICD-10-CM | POA: Diagnosis not present

## 2020-10-25 ENCOUNTER — Ambulatory Visit: Payer: Self-pay | Admitting: Neurology

## 2020-10-26 DIAGNOSIS — H35063 Retinal vasculitis, bilateral: Secondary | ICD-10-CM | POA: Diagnosis not present

## 2020-10-26 DIAGNOSIS — H26493 Other secondary cataract, bilateral: Secondary | ICD-10-CM | POA: Diagnosis not present

## 2020-10-26 DIAGNOSIS — H532 Diplopia: Secondary | ICD-10-CM | POA: Diagnosis not present

## 2020-11-08 DIAGNOSIS — H35063 Retinal vasculitis, bilateral: Secondary | ICD-10-CM | POA: Diagnosis not present

## 2020-11-08 DIAGNOSIS — H53453 Other localized visual field defect, bilateral: Secondary | ICD-10-CM | POA: Diagnosis not present

## 2020-11-15 DIAGNOSIS — Z6825 Body mass index (BMI) 25.0-25.9, adult: Secondary | ICD-10-CM | POA: Diagnosis not present

## 2020-11-15 DIAGNOSIS — R300 Dysuria: Secondary | ICD-10-CM | POA: Diagnosis not present

## 2020-11-15 DIAGNOSIS — N390 Urinary tract infection, site not specified: Secondary | ICD-10-CM | POA: Diagnosis not present

## 2020-11-15 DIAGNOSIS — N400 Enlarged prostate without lower urinary tract symptoms: Secondary | ICD-10-CM | POA: Diagnosis not present

## 2020-11-18 ENCOUNTER — Ambulatory Visit: Payer: Self-pay | Admitting: Neurology

## 2020-11-26 DIAGNOSIS — H26493 Other secondary cataract, bilateral: Secondary | ICD-10-CM | POA: Diagnosis not present

## 2020-11-29 DIAGNOSIS — N390 Urinary tract infection, site not specified: Secondary | ICD-10-CM | POA: Diagnosis not present

## 2020-11-29 DIAGNOSIS — N400 Enlarged prostate without lower urinary tract symptoms: Secondary | ICD-10-CM | POA: Diagnosis not present

## 2020-11-29 DIAGNOSIS — Z6825 Body mass index (BMI) 25.0-25.9, adult: Secondary | ICD-10-CM | POA: Diagnosis not present

## 2021-01-12 ENCOUNTER — Ambulatory Visit: Payer: Medicare PPO | Admitting: Neurology

## 2021-01-12 ENCOUNTER — Other Ambulatory Visit: Payer: Self-pay

## 2021-01-12 DIAGNOSIS — M5481 Occipital neuralgia: Secondary | ICD-10-CM

## 2021-01-12 DIAGNOSIS — G8929 Other chronic pain: Secondary | ICD-10-CM

## 2021-01-12 DIAGNOSIS — G43001 Migraine without aura, not intractable, with status migrainosus: Secondary | ICD-10-CM | POA: Diagnosis not present

## 2021-01-12 DIAGNOSIS — G500 Trigeminal neuralgia: Secondary | ICD-10-CM

## 2021-01-12 DIAGNOSIS — R519 Headache, unspecified: Secondary | ICD-10-CM | POA: Diagnosis not present

## 2021-01-12 DIAGNOSIS — G43711 Chronic migraine without aura, intractable, with status migrainosus: Secondary | ICD-10-CM

## 2021-01-12 NOTE — Progress Notes (Signed)
Nerve block w/o steroid: Pt signed consent  0.5% Bupivocaine 6 mL LOT: [id number] EXP: 09/22 NDC: 63323-467-57  2% Lidocaine 6 mL LOT: [id number] EXP: 09/2021 NDC: 55150-255-20  

## 2021-01-12 NOTE — Progress Notes (Signed)
Performed by Dr. Lucia Gaskins M.D. 30-gauge needle was used. All procedures a documented blood were medically necessary, reasonable and appropriate based on the patient's history, medical diagnosis and physician opinion. Verbal informed consent was obtained from the patient, patient was informed of potential risk of procedure, including bruising, bleeding, hematoma formation, infection, muscle weakness, muscle pain, numbness, transient hypertension, transient hyperglycemia and transient insomnia among others. All areas injected were topically clean with isopropyl rubbing alcohol. Nonsterile nonlatex gloves were worn during the procedure.  1. Greater occipital nerve block [phone]). The greater occipital nerve site was identified at the nuchal line medial to the occipital artery. Medication was injected into the left occipital nerve areas and suboccipital areas. Patient's condition is associated with inflammation of the greater occipital nerve and associated multiple groups. Injection was deemed medically necessary, reasonable and appropriate. Injection represents a separate and unique surgical service.  2. Supraorbital nerve block ([id number]): Supraorbital nerve site was identified along the incision of the frontal bone on the orbital/supraorbital ridge. Medication was injected into the right supraorbital nerve areas. Patient's condition is associated with inflammation of the supraorbital and associated muscle groups. Injection was deemed medically necessary, reasonable and appropriate. Injection represents a separate and unique surgical service.  3. 20553: Bilateral trapezius, bilateral levator scapulae, bilateral cervical paraspinal muscles 2 levels

## 2021-02-03 DIAGNOSIS — H47013 Ischemic optic neuropathy, bilateral: Secondary | ICD-10-CM | POA: Diagnosis not present

## 2021-02-03 DIAGNOSIS — H35061 Retinal vasculitis, right eye: Secondary | ICD-10-CM | POA: Diagnosis not present

## 2021-02-14 ENCOUNTER — Ambulatory Visit: Payer: Medicare PPO | Admitting: Infectious Disease

## 2021-02-28 DIAGNOSIS — E7849 Other hyperlipidemia: Secondary | ICD-10-CM | POA: Diagnosis not present

## 2021-02-28 DIAGNOSIS — K2100 Gastro-esophageal reflux disease with esophagitis, without bleeding: Secondary | ICD-10-CM | POA: Diagnosis not present

## 2021-02-28 DIAGNOSIS — E782 Mixed hyperlipidemia: Secondary | ICD-10-CM | POA: Diagnosis not present

## 2021-02-28 DIAGNOSIS — R5383 Other fatigue: Secondary | ICD-10-CM | POA: Diagnosis not present

## 2021-02-28 DIAGNOSIS — E1140 Type 2 diabetes mellitus with diabetic neuropathy, unspecified: Secondary | ICD-10-CM | POA: Diagnosis not present

## 2021-03-02 DIAGNOSIS — E7849 Other hyperlipidemia: Secondary | ICD-10-CM | POA: Diagnosis not present

## 2021-03-02 DIAGNOSIS — E1165 Type 2 diabetes mellitus with hyperglycemia: Secondary | ICD-10-CM | POA: Diagnosis not present

## 2021-03-02 DIAGNOSIS — K2100 Gastro-esophageal reflux disease with esophagitis, without bleeding: Secondary | ICD-10-CM | POA: Diagnosis not present

## 2021-03-02 DIAGNOSIS — R197 Diarrhea, unspecified: Secondary | ICD-10-CM | POA: Diagnosis not present

## 2021-03-02 DIAGNOSIS — N400 Enlarged prostate without lower urinary tract symptoms: Secondary | ICD-10-CM | POA: Diagnosis not present

## 2021-03-02 DIAGNOSIS — E1140 Type 2 diabetes mellitus with diabetic neuropathy, unspecified: Secondary | ICD-10-CM | POA: Diagnosis not present

## 2021-03-02 DIAGNOSIS — R1313 Dysphagia, pharyngeal phase: Secondary | ICD-10-CM | POA: Diagnosis not present

## 2021-03-10 ENCOUNTER — Telehealth: Payer: Self-pay

## 2021-03-10 ENCOUNTER — Ambulatory Visit (INDEPENDENT_AMBULATORY_CARE_PROVIDER_SITE_OTHER): Payer: Medicare PPO | Admitting: Infectious Disease

## 2021-03-10 ENCOUNTER — Encounter: Payer: Self-pay | Admitting: Infectious Disease

## 2021-03-10 ENCOUNTER — Other Ambulatory Visit (HOSPITAL_COMMUNITY): Payer: Self-pay

## 2021-03-10 ENCOUNTER — Other Ambulatory Visit: Payer: Self-pay

## 2021-03-10 VITALS — BP 148/89 | HR 85 | Temp 97.8°F | Wt 184.4 lb

## 2021-03-10 DIAGNOSIS — H35063 Retinal vasculitis, bilateral: Secondary | ICD-10-CM

## 2021-03-10 DIAGNOSIS — A09 Infectious gastroenteritis and colitis, unspecified: Secondary | ICD-10-CM | POA: Diagnosis not present

## 2021-03-10 DIAGNOSIS — A4901 Methicillin susceptible Staphylococcus aureus infection, unspecified site: Secondary | ICD-10-CM | POA: Diagnosis not present

## 2021-03-10 DIAGNOSIS — M7022 Olecranon bursitis, left elbow: Secondary | ICD-10-CM

## 2021-03-10 DIAGNOSIS — M00022 Staphylococcal arthritis, left elbow: Secondary | ICD-10-CM | POA: Diagnosis not present

## 2021-03-10 DIAGNOSIS — M86032 Acute hematogenous osteomyelitis, left radius and ulna: Secondary | ICD-10-CM | POA: Diagnosis not present

## 2021-03-10 DIAGNOSIS — E109 Type 1 diabetes mellitus without complications: Secondary | ICD-10-CM | POA: Diagnosis not present

## 2021-03-10 HISTORY — DX: Infectious gastroenteritis and colitis, unspecified: A09

## 2021-03-10 MED ORDER — VANCOMYCIN HCL 125 MG PO CAPS: 125.0000 mg | ORAL_CAPSULE | Freq: Four times a day (QID) | ORAL | 0 refills | Status: DC

## 2021-03-10 NOTE — Progress Notes (Signed)
Subjective:  Chief complaint copious diarrhea for nearly 4 to [redacted] weeks along with nausea and vomiting when he tries to have food shaking chills    Patient ID: Kyle [name], male    DOB: 01-Dec-1955, 65 y.o.   MRN: [id number]  HPI  Kyle [name] is a 65 year old Caucasian man with a past medical his significant for temporal arteritis chronic corticosteroid use diabetes mellitus who had septic arthritis and olecranon bursitis status post olecranon bursectomy and elbow I&D with debridement of triceps and debridement of bone from the proximal ulna with nerve decompression by Dr. Griffin Basil.  He was treated with initially with doxycycline Augmentin orally then started on IV cefazolin and received protracted cephalexin for me.  In the interim he had worsening of retinal vasculitis and needed to be placed on Actemra via his [facility] ophthalmologist along with corticosteroids.  His vision improved dramatically with Actemra and also with recent surgery.  His Keflex he stopped in June when there were no refills left for it.  His elbow has not had any worsening pain since stopping the Keflex.  Unfortunately over the last 4 to 5 weeks has been suffering from what was initially thought to be a potential "GI bug" but has persisted he has had at least 7-8 if not more bowel movements per day saying that sometimes he cannot leave the house because of the frequency of bowel movements he also has nausea with vomiting when he takes food and has been eating much much less than normal in fact hardly anything.  He is also had chills and at times what sounds like rigors.      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   FEMUR SURGERY  1972   OLECRANON BURSECTOMY Left 05/05/2020   Procedure: OLECRANON BURSECTOMY DEBRIDEMENT TRICEP, SEPTIC ELBOW IRRIGATION AND DEBRIDEMENT DEBRIDEMENT OF OSTEOMYLELITIS PROXIMAL ULNA;  Surgeon: Hiram Gash, MD;  Location: [place];  Service: Orthopedics;  Laterality: Left;   spinal stimulator implanted  2001   spinal stimulator replaced  2011   teeth removal  2020   2   tooth removal  2020   ULNAR NERVE TRANSPOSITION Left 05/05/2020   Procedure: ULNAR NERVE DECOMPRESSION/TRANSPOSITION;  Surgeon: Hiram Gash, MD;  Location: [place];  Service: Orthopedics;  Laterality: Left;    Family History  Problem Relation Age of Onset   Brain cancer Mother    Heart disease Father    Heart attack Father    Diabetes Maternal Grandfather    Cancer Paternal  Grandmother       Social History   Socioeconomic History   Marital status: Married    Spouse name: Kendrix Orman   Number of children: 2   Years of education: 12   Highest education level: Not on file  Occupational History   Occupation: disabled  Tobacco Use   Smoking status: Former    Types: Cigarettes    Quit date: 1987    Years since quitting: 35.6   Smokeless tobacco: Never   Tobacco comments:    3-4 per day  Vaping Use   Vaping Use: Never used  Substance and Sexual Activity   Alcohol use: Yes    Comment: occasionally   Drug use: Never   Sexual  activity: Not on file  Other Topics Concern   Not on file  Social History Narrative   Lives at home with his wife   Right handed   Caffeine: 1-2 daily   Social Determinants of Health   Financial Resource Strain: Not on file  Food Insecurity: Not on file  Transportation Needs: Not on file  Physical Activity: Not on file  Stress: Not on file  Social Connections: Not on file    Allergies  Allergen Reactions   Aspirin Anaphylaxis   Ibuprofen Hives and Swelling     Current Outpatient Medications:    ACTEMRA ACTPEN 162 MG/0.9ML SOAJ, , Disp: , Rfl:    Atogepant 60 MG TABS, Take 60 mg by mouth daily., Disp: 32 tablet, Rfl: 0   BD INSULIN SYRINGE U/F 31G X 5/16" 1 ML MISC, , Disp: , Rfl:    buPROPion (WELLBUTRIN XL) 150 MG 24 hr tablet, Take 150 mg by mouth 3 (three) times daily., Disp: , Rfl:    Calcium Carbonate Antacid (TUMS PO), Take by mouth as needed., Disp: , Rfl:    divalproex (DEPAKOTE ER) 500 MG 24 hr tablet, Start with one pill at bedtime for 1 week then increase to 2 pills at bedtime., Disp: 30 tablet, Rfl: 11   famotidine (PEPCID) 20 MG tablet, , Disp: , Rfl:    gabapentin (NEURONTIN) 300 MG capsule, Take 2 capsules (600 mg total) by mouth 3 (three) times daily., Disp: 540 capsule, Rfl: 4   glipiZIDE (GLUCOTROL XL) 10 MG 24 hr tablet, Take 10 mg by mouth 2 (two) times daily., Disp: , Rfl:    insulin glargine (LANTUS) 100 UNIT/ML injection, Inject 20 Units into the skin 2 (two) times daily., Disp: , Rfl:    naloxone (NARCAN) 4 MG/0.1ML LIQD nasal spray kit, Place into the nose., Disp: , Rfl:    omeprazole (PRILOSEC) 20 MG capsule, Take 1 capsule (20 mg total) by mouth 2 (two) times daily before a meal., Disp: 40 capsule, Rfl: 1   oxyCODONE-acetaminophen (PERCOCET/ROXICET) 5-325 MG tablet, Take 2 tablets by mouth 3 (three) times daily as needed. , Disp: , Rfl:    tiZANidine (ZANAFLEX) 2 MG tablet, Take 2 mg by mouth 2 (two) times a day., Disp: , Rfl:    trazodone (DESYREL)  300 MG tablet, Take 300 mg by mouth at bedtime., Disp: , Rfl:    vancomycin (VANCOCIN) 125 MG capsule, Take 1 capsule (125 mg total) by mouth 4 (four) times daily for 14 days., Disp: 56 capsule, Rfl: 0   zolpidem (AMBIEN CR) 12.5 MG CR tablet, Take 12.5 mg by mouth at bedtime., Disp: , Rfl:    fluconazole (DIFLUCAN) 100 MG tablet, Take 1 tablet (100 mg total) by mouth daily. (Patient not taking: Reported  on 03/10/2021), Disp: 14 tablet, Rfl: 0    Review of Systems  Constitutional:  Positive for activity change, appetite change, chills, diaphoresis and fatigue. Negative for fever.  HENT:  Negative for congestion, rhinorrhea, sinus pressure, sneezing, sore throat and trouble swallowing.   Eyes:  Negative for photophobia and visual disturbance.  Respiratory:  Negative for cough, chest tightness, shortness of breath, wheezing and stridor.   Cardiovascular:  Negative for chest pain, palpitations and leg swelling.  Gastrointestinal:  Positive for diarrhea, nausea and vomiting. Negative for abdominal distention, abdominal pain, anal bleeding, blood in stool and constipation.  Genitourinary:  Negative for difficulty urinating, dysuria, flank pain and hematuria.  Musculoskeletal:  Positive for myalgias. Negative for arthralgias, back pain, gait problem and joint swelling.  Skin:  Negative for color change, pallor, rash and wound.  Neurological:  Negative for dizziness, tremors, weakness and light-headedness.  Hematological:  Negative for adenopathy. Does not bruise/bleed easily.  Psychiatric/Behavioral:  Negative for agitation, behavioral problems, confusion, decreased concentration, dysphoric mood and sleep disturbance.       Objective:   Physical Exam Constitutional:      General: He is not in acute distress.    Appearance: Normal appearance. He is well-developed. He is not ill-appearing or diaphoretic.  HENT:     Head: Normocephalic and atraumatic.     Right Ear: Hearing and external ear  normal.     Left Ear: Hearing and external ear normal.     Nose: No nasal deformity or rhinorrhea.  Eyes:     General: No scleral icterus.    Conjunctiva/sclera: Conjunctivae normal.     Right eye: Right conjunctiva is not injected.     Left eye: Left conjunctiva is not injected.     Pupils: Pupils are equal, round, and reactive to light.  Neck:     Vascular: No JVD.  Cardiovascular:     Rate and Rhythm: Normal rate and regular rhythm.     Heart sounds: Normal heart sounds, S1 normal and S2 normal. No murmur heard.   No friction rub. No gallop.  Pulmonary:     Effort: Pulmonary effort is normal. No respiratory distress.     Breath sounds: No stridor. No wheezing or rhonchi.  Chest:     Chest wall: No tenderness.  Abdominal:     General: Bowel sounds are normal. There is no distension.     Palpations: Abdomen is soft. There is no mass.     Tenderness: There is no abdominal tenderness.     Hernia: No hernia is present.  Musculoskeletal:        General: Normal range of motion.     Right shoulder: Normal. No swelling.     Left shoulder: Normal. No swelling.     Right elbow: Normal. No swelling.     Left elbow: Normal. No swelling.     Cervical back: Normal range of motion and neck supple.     Right hip: Normal.     Left hip: Normal.     Right knee: Normal.     Left knee: Normal.  Lymphadenopathy:     Head:     Right side of head: No submandibular, preauricular or posterior auricular adenopathy.     Left side of head: No submandibular, preauricular or posterior auricular adenopathy.     Cervical: No cervical adenopathy.     Right cervical: No superficial or deep cervical adenopathy.    Left cervical: No superficial or deep cervical adenopathy.  Skin:  General: Skin is warm and dry.     Coloration: Skin is pale.     Findings: No abrasion, bruising, ecchymosis, erythema, lesion or rash.     Nails: There is no clubbing.  Neurological:     General: No focal deficit present.      Mental Status: He is alert and oriented to person, place, and time.     Sensory: No sensory deficit.     Coordination: Coordination normal.     Gait: Gait normal.  Psychiatric:        Attention and Perception: Attention and perception normal. He is attentive.        Mood and Affect: Mood is depressed.        Speech: Speech normal.        Behavior: Behavior normal. Behavior is cooperative.        Thought Content: Thought content normal.        Cognition and Memory: Cognition and memory normal.        Judgment: Judgment normal.          Assessment & Plan:   Diarrhea with intermittent nausea and vomiting when he takes food:  --checking CBC w differential CMP with GFR.  --Checking plain films to look for ileus --Stool for C. Difficile --I would like to start empiric therapy for him for C. difficile colitis.  Unfortunately his Medicare plan requires a prior authorization for vancomycin Dificid can be obtained but through the pharmaceutical assistance program.  For some crazy reason his insurance wants me to give him metronidazole which is no longer improved treatment for C. difficile.  -- I am also going to check a set of blood cultures  We are filling out patient assistance program to try to get him Dificid as soon as possible.  I am also having him come see me next Tuesday.  Have a very low threshold to have him directly admitted from clinic next Tuesday of a centimeter and certainly if he worsens clinically in the interim would have him come into the hospital.  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  difficile.  Diabetes mellitus have concerned about his diabetes in the context of him having difficulty eating.  I spent 42 minutes with the patient including face to face counseling of the patient and his wife with regards to his diarrhea nausea vomiting and chills and the potential infectious causes of this including C. difficile colitis, choledocholithiasis, bloodstream infection in review medical records before and during the visit and in coordination of his care with [facility] pharmacy.

## 2021-03-10 NOTE — Telephone Encounter (Signed)
[facility] Patient Advocate Encounter  Completed and sent [employer] application for Dificid for this patient who is insured Medicare Part D.  Patient assistance phone number for follow up is [phone].   Fax #[phone]  This encounter will be updated until final determination.     Clearance Coots, CPhT Specialty Pharmacy Patient [facility] for Infectious Disease Phone: [phone] Fax:  [phone]

## 2021-03-10 NOTE — Telephone Encounter (Signed)
[facility] Patient Advocate Encounter   Received notification from [facility] that prior authorization for Vancomycin is required.   PA submitted on 03/10/21 Key [id number] Status is pending    [facility] Clinic will continue to follow.   Clearance Coots, CPhT Specialty Pharmacy Patient [facility] for Infectious Disease Phone: [phone] Fax:  [phone]

## 2021-03-11 ENCOUNTER — Other Ambulatory Visit (HOSPITAL_COMMUNITY): Payer: Self-pay

## 2021-03-11 ENCOUNTER — Other Ambulatory Visit: Payer: Self-pay | Admitting: Pharmacist

## 2021-03-11 DIAGNOSIS — A09 Infectious gastroenteritis and colitis, unspecified: Secondary | ICD-10-CM

## 2021-03-11 MED ORDER — DIFICID 200 MG PO TABS
200.0000 mg | ORAL_TABLET | Freq: Two times a day (BID) | ORAL | 0 refills | Status: DC
  Filled 2021-03-11: qty 20, 10d supply, fill #0

## 2021-03-11 NOTE — Progress Notes (Signed)
Patient advocate Trey Paula was able to receive assistance through the C. Diff Assistance fund for patient's fidaxomicin prescription. He will receive fidaxomicin 200mg  BID for 10 days at no charge. Prescription sent to [facility] and will be mailed to patient's address. Address is up-to-date.  [facility], PharmD, CPP Clinical Pharmacist Practitioner Infectious Diseases Clinical Pharmacist [facility] for Infectious Disease

## 2021-03-11 NOTE — TOC Benefit Eligibility Note (Signed)
Patient Advocate Encounter  Patient is approved through the Clostridium Difficile Associated Diarrhea Copay Assistance for Dificid through The Assistance Fund.      [name], CPhT Pharmacy Patient Advocate Specialist [place] [facility] Stewardship Team Direct Number: [phone]  Fax: [phone]  

## 2021-03-14 ENCOUNTER — Ambulatory Visit (INDEPENDENT_AMBULATORY_CARE_PROVIDER_SITE_OTHER): Payer: Medicare PPO | Admitting: Neurology

## 2021-03-14 ENCOUNTER — Other Ambulatory Visit (HOSPITAL_COMMUNITY): Payer: Self-pay

## 2021-03-14 ENCOUNTER — Telehealth: Payer: Self-pay

## 2021-03-14 ENCOUNTER — Encounter: Payer: Self-pay | Admitting: Neurology

## 2021-03-14 ENCOUNTER — Other Ambulatory Visit: Payer: Self-pay

## 2021-03-14 ENCOUNTER — Ambulatory Visit
Admission: RE | Admit: 2021-03-14 | Discharge: 2021-03-14 | Disposition: A | Discharge status: Home / Self Care | Payer: Medicare PPO | Source: Ambulatory Visit | Attending: Infectious Disease | Admitting: Infectious Disease

## 2021-03-14 VITALS — BP 138/93 | HR 90

## 2021-03-14 DIAGNOSIS — M5481 Occipital neuralgia: Secondary | ICD-10-CM

## 2021-03-14 DIAGNOSIS — R519 Headache, unspecified: Secondary | ICD-10-CM | POA: Diagnosis not present

## 2021-03-14 DIAGNOSIS — G43711 Chronic migraine without aura, intractable, with status migrainosus: Secondary | ICD-10-CM

## 2021-03-14 DIAGNOSIS — R197 Diarrhea, unspecified: Secondary | ICD-10-CM | POA: Diagnosis not present

## 2021-03-14 DIAGNOSIS — G500 Trigeminal neuralgia: Secondary | ICD-10-CM

## 2021-03-14 DIAGNOSIS — R109 Unspecified abdominal pain: Secondary | ICD-10-CM | POA: Diagnosis not present

## 2021-03-14 DIAGNOSIS — Z9049 Acquired absence of other specified parts of digestive tract: Secondary | ICD-10-CM | POA: Diagnosis not present

## 2021-03-14 DIAGNOSIS — A09 Infectious gastroenteritis and colitis, unspecified: Secondary | ICD-10-CM

## 2021-03-14 DIAGNOSIS — G8929 Other chronic pain: Secondary | ICD-10-CM

## 2021-03-14 IMAGING — CR DG ABDOMEN ACUTE W/ 1V CHEST
3 series · 3 of 3 positions shown · non-contrast
Comparison: None.

CLINICAL DATA: Abdominal pain and diarrhea

EXAM:
DG ABDOMEN ACUTE WITH 1 VIEW CHEST

[w chest pa]
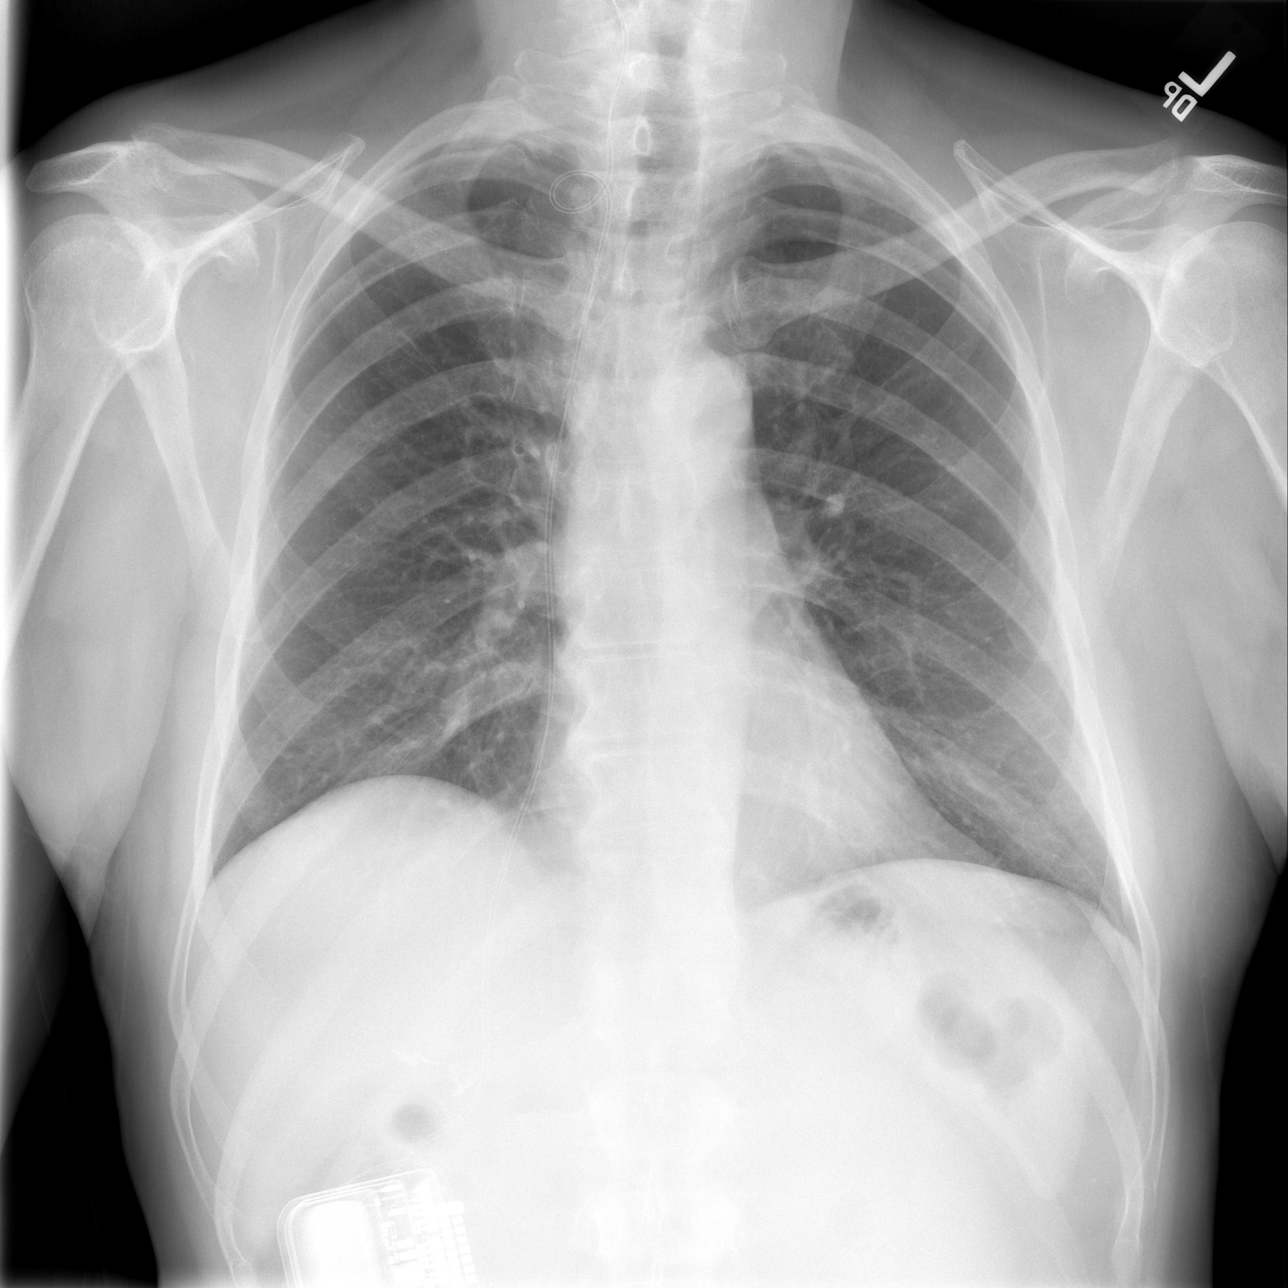

[w abdomen upright *]
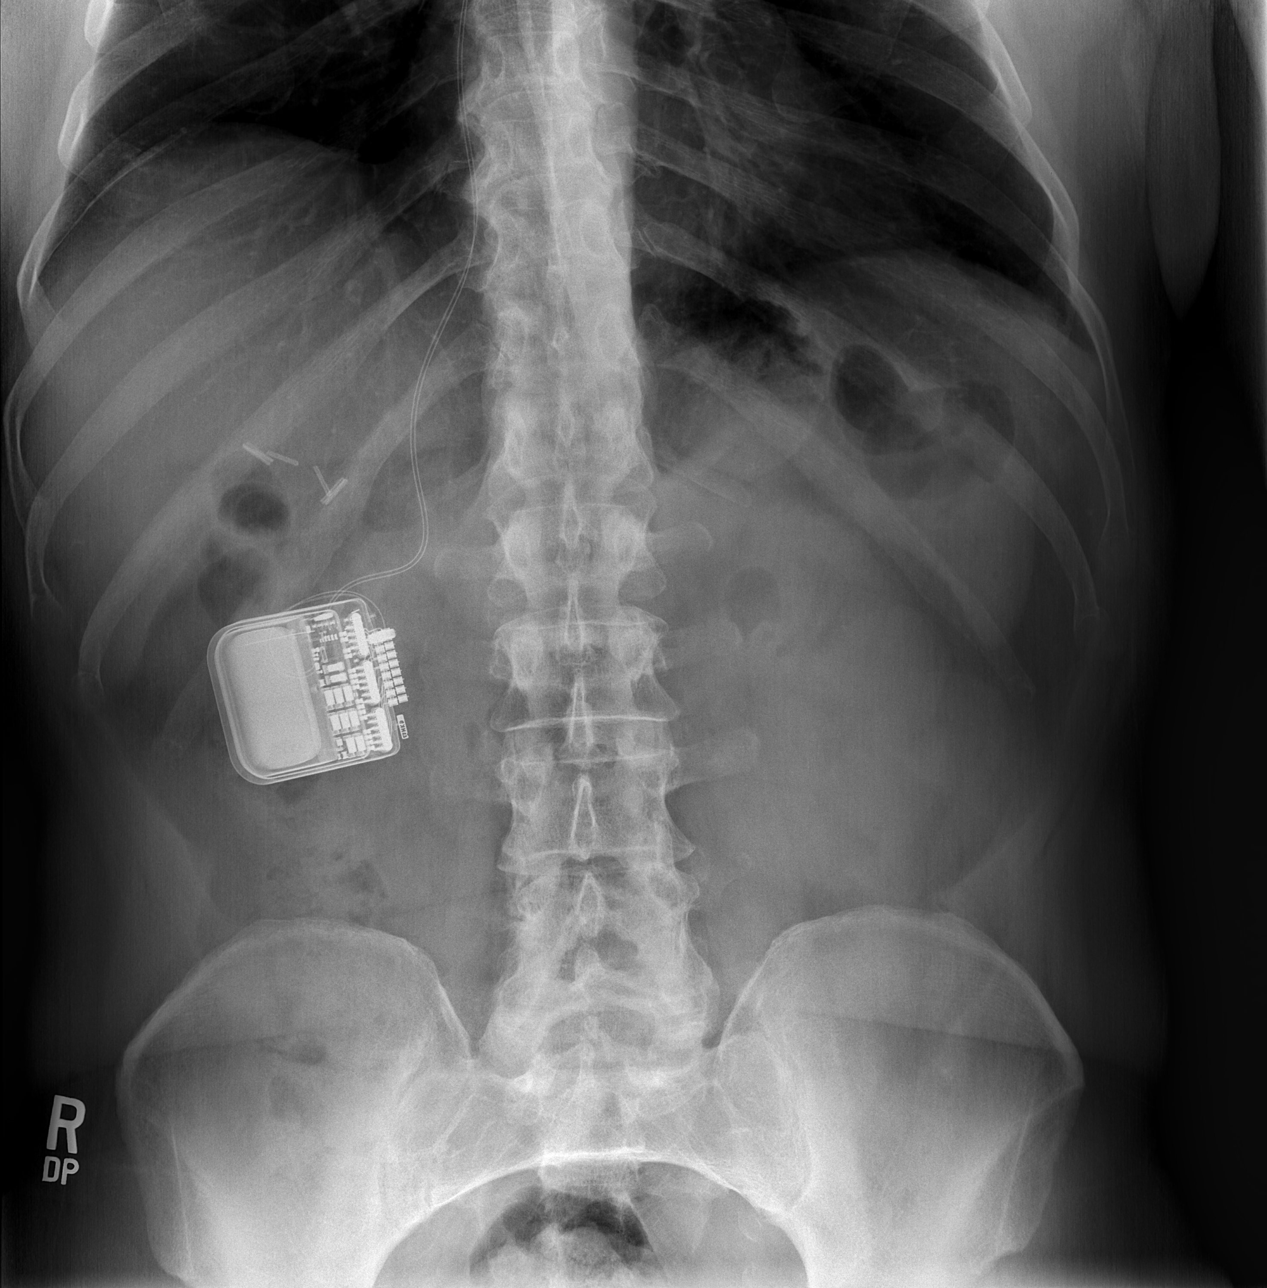

[t abdomen supine]
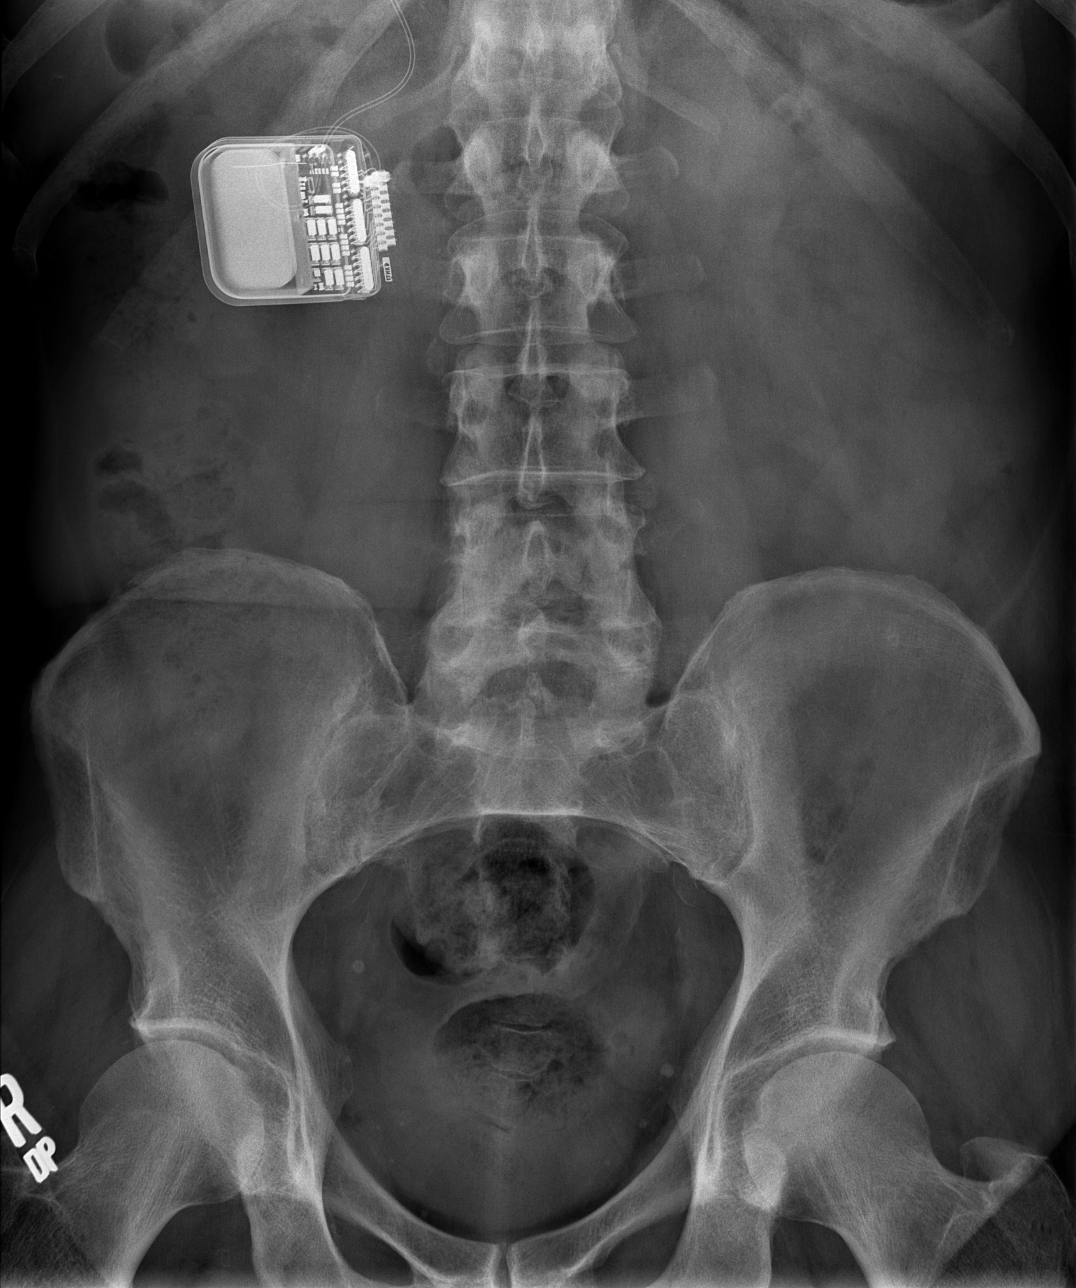

[3 of 3 positions shown; findings below may reference images not displayed]

FINDINGS: Cardiac shadow is within normal limits. The lungs are well aerated
bilaterally. No focal infiltrate or sizable effusion is seen. No
acute bony abnormality is noted. Stimulator pack is noted in the
right chest and abdomen.

Scattered large and small bowel gas is noted. Changes of prior
cholecystectomy are seen. No abnormal mass or abnormal
calcifications are seen. No acute bony abnormality is noted.
IMPRESSION: No acute abnormality seen.

## 2021-03-14 NOTE — Progress Notes (Signed)
Nerve block( w/o) steroid: Pt signed consent-YES  0.5% Bupivocaine 20 mL LOT: [id number] EXP: 09/15/2022 NDC: 9407-6808  2% Lidocaine 20 mL LOT: [id number] EXP: 09/2021 NDC: 58592-924-46

## 2021-03-14 NOTE — Telephone Encounter (Signed)
Patient's wife called, states they were unable to complete the imaging ordered by Dr. Daiva Eves, but that they plan on doing it today. He was not able to produce a stool sample as he has not had any episodes of diarrhea since being seen. Patient's wife reports he is eating better and has not vomited, but that he is still fatigued. Will route to provider.   Sandie Ano, RN

## 2021-03-14 NOTE — Telephone Encounter (Signed)
Attempted to call patient to advise him that he should still come for his appointment tomorrow, no answer. Will send MyChart message.   Sandie Ano, RN

## 2021-03-14 NOTE — Telephone Encounter (Signed)
[facility] Patient Advocate Encounter  Prior Authorization for Vancomycin has been approved.     Effective dates: 03/10/21 through 07/16/21    [facility] will continue to follow.  Clearance Coots, CPhT Specialty Pharmacy Patient [facility] for Infectious Disease Phone: [phone] Fax:  [phone]

## 2021-03-15 ENCOUNTER — Telehealth: Payer: Self-pay | Admitting: *Deleted

## 2021-03-15 ENCOUNTER — Encounter: Payer: Self-pay | Admitting: Infectious Disease

## 2021-03-15 ENCOUNTER — Other Ambulatory Visit: Payer: Self-pay

## 2021-03-15 ENCOUNTER — Ambulatory Visit (INDEPENDENT_AMBULATORY_CARE_PROVIDER_SITE_OTHER): Payer: Medicare PPO | Admitting: Infectious Disease

## 2021-03-15 VITALS — BP 147/87 | HR 81 | Temp 97.6°F | Ht 73.0 in | Wt 187.0 lb

## 2021-03-15 DIAGNOSIS — A4901 Methicillin susceptible Staphylococcus aureus infection, unspecified site: Secondary | ICD-10-CM | POA: Diagnosis not present

## 2021-03-15 DIAGNOSIS — A09 Infectious gastroenteritis and colitis, unspecified: Secondary | ICD-10-CM | POA: Diagnosis not present

## 2021-03-15 DIAGNOSIS — M86332 Chronic multifocal osteomyelitis, left radius and ulna: Secondary | ICD-10-CM | POA: Diagnosis not present

## 2021-03-15 DIAGNOSIS — K2100 Gastro-esophageal reflux disease with esophagitis, without bleeding: Secondary | ICD-10-CM | POA: Diagnosis not present

## 2021-03-15 DIAGNOSIS — M130 Polyarthritis, unspecified: Secondary | ICD-10-CM

## 2021-03-15 DIAGNOSIS — H35063 Retinal vasculitis, bilateral: Secondary | ICD-10-CM | POA: Diagnosis not present

## 2021-03-15 HISTORY — DX: Polyarthritis, unspecified: M13.0

## 2021-03-15 NOTE — Telephone Encounter (Signed)
Completed order form for numbing cream, dx: CRPS. Order form signed by Dr Lucia Gaskins then faxed to [facility]. Received a receipt of confirmation.  Amantadine 8%, baclofen 2%, gabapentin 6%, amitriptyline 4%, bupivacaine 2%, clonidine 0.2%  Apply 1 to 2 g to the affected area 3-4 times daily.  Dispense 240 g with 5 refills.  Per Dr. Lucia Gaskins, okay for the pharmacist to substitute another formulation for insurance purposes.  Compound cream added to [facility].

## 2021-03-15 NOTE — Progress Notes (Signed)
Performed by Dr. Lucia Gaskins M.D. 30-gauge needle was used. All procedures a documented blood were medically necessary, reasonable and appropriate based on the patient's history, medical diagnosis and physician opinion. Verbal informed consent was obtained from the patient, patient was informed of potential risk of procedure, including bruising, bleeding, hematoma formation, infection, muscle weakness, muscle pain, numbness, transient hypertension, transient hyperglycemia and transient insomnia among others. All areas injected were topically clean with isopropyl rubbing alcohol. Nonsterile nonlatex gloves were worn during the procedure.  1. Greater occipital nerve block [phone]). The greater occipital nerve site was identified at the nuchal line medial to the occipital artery. Medication was injected into the left and right occipital nerve areas and suboccipital areas. Patient's condition is associated with inflammation of the greater occipital nerve and associated multiple groups. Injection was deemed medically necessary, reasonable and appropriate. Injection represents a separate and unique surgical service.  2. Supraorbital nerve block (64400): Supraorbital nerve site was identified along the incision of the frontal bone on the orbital/supraorbital ridge. Medication was injected into the right supraorbital nerve areas. Patient's condition is associated with inflammation of the supraorbital and associated muscle groups. Injection was deemed medically necessary, reasonable and appropriate. Injection represents a separate and unique surgical service.  3. 20553: Bilateral trapezius, bilateral levator scapulae, bilateral cervical paraspinal muscles 2 levels

## 2021-03-15 NOTE — Progress Notes (Signed)
Subjective:  Chief complaint: Polyarthritis   Patient ID: Kyle [name], male    DOB: 08-Nov-1955, 65 y.o.   MRN: [id number]  HPI  Kyle [name] is a 65 year old Caucasian man with a past medical his significant for temporal arteritis chronic corticosteroid use diabetes mellitus who had septic arthritis and olecranon bursitis status post olecranon bursectomy and elbow I&D with debridement of triceps and debridement of bone from the proximal ulna with nerve decompression by Dr. Griffin Basil.  He was treated with initially with doxycycline Augmentin orally then started on IV cefazolin and received protracted cephalexin for me.  In the interim he had worsening of retinal vasculitis and needed to be placed on Actemra via his [facility] ophthalmologist along with corticosteroids.  His vision improved dramatically with Actemra and also with recent surgery.  His Keflex he stopped in June when there were no refills left for it.  His elbow has not had any worsening pain since stopping the Keflex.  Unfortunately over the last 4 to 5 weeks to my visit with him last week he had been suffering from what was initially thought to be a potential "GI bug" but has persisted he has had at least 7-8 if not more bowel movements per day saying that sometimes he cannot leave the house because of the frequency of bowel movements he also has nausea with vomiting when he takes food and has been eating much much less than normal in fact hardly anything.  He is also had chills and at times what sounds like rigors.  Obtained a CBC with differential which showed a little bit of polycythemia but otherwise relatively normal.  Comprehensive metabolic panel showed mild elevation in his ALT.  Blood cultures were taken and have not grown any organism to date ordered a KUB which was negative for an ileus or small bowel obstruction or intra-abdominal pathology on review of films performed yesterday.  I had wanted to test his stool for C. difficile  but in the interim we are able to send him Dificid to his home and he began that on Friday.  Since beginning the Dificid his diarrhea has stopped completely.  He is not in fact had any bowel movements he is eating a little bit more food. He is not vomiting but still has low-grade nausea.  He does have progression of inflammation in his joints he says "every day and new joint hurts"  Currently complains of pain in his elbows bilaterally wrists with some numbness on the left side as well as pain in his right thigh right knee left knee.  Multiple joints are tender but none are warm I cannot palpate any effusions.        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   FEMUR SURGERY  1972   OLECRANON BURSECTOMY Left 05/05/2020   Procedure: OLECRANON BURSECTOMY DEBRIDEMENT TRICEP, SEPTIC ELBOW IRRIGATION AND DEBRIDEMENT DEBRIDEMENT OF OSTEOMYLELITIS PROXIMAL ULNA;  Surgeon: Hiram Gash, MD;  Location: [place];  Service: Orthopedics;  Laterality: Left;   spinal stimulator implanted  2001   spinal stimulator replaced  2011   teeth removal  2020   2    tooth removal  2020   ULNAR NERVE TRANSPOSITION Left 05/05/2020   Procedure: ULNAR NERVE DECOMPRESSION/TRANSPOSITION;  Surgeon: Hiram Gash, MD;  Location: [place];  Service: Orthopedics;  Laterality: Left;    Family History  Problem Relation Age of Onset   Brain cancer Mother    Heart disease Father    Heart attack Father    Diabetes Maternal Grandfather    Cancer Paternal Grandmother       Social History   Socioeconomic History   Marital status: Married    Spouse name: Hosea Hanawalt   Number of children: 2   Years of education: 12   Highest education level: Not on file  Occupational History   Occupation: disabled  Tobacco Use   Smoking status: Former    Types: Cigarettes    Quit date: 1987    Years since quitting: 35.6   Smokeless tobacco: Never   Tobacco comments:    3-4 per day  Vaping Use   Vaping Use: Never used  Substance and Sexual Activity   Alcohol use: Yes    Comment: occasionally   Drug use: Never   Sexual activity: Not on file  Other Topics Concern   Not on file  Social History Narrative   Lives at home with his wife   Right handed   Caffeine: 1-2 daily   Social Determinants of Health   Financial Resource Strain: Not on file  Food Insecurity: Not on file  Transportation Needs: Not on file  Physical Activity: Not on file  Stress: Not on file  Social Connections: Not on file    Allergies  Allergen Reactions   Aspirin Anaphylaxis   Ibuprofen Hives and Swelling     Current Outpatient Medications:    ACTEMRA ACTPEN 162 MG/0.9ML SOAJ, , Disp: , Rfl:    Atogepant 60 MG TABS, Take 60 mg by mouth daily., Disp: , Rfl:    BD INSULIN SYRINGE U/F 31G X 5/16" 1 ML MISC, , Disp: , Rfl:    buPROPion (WELLBUTRIN XL) 150 MG 24 hr tablet, Take 150 mg by mouth 3 (three) times daily., Disp: , Rfl:    Calcium Carbonate Antacid (TUMS PO), Take by mouth as needed., Disp: , Rfl:    divalproex (DEPAKOTE ER) 500 MG 24 hr tablet, Start  with one pill at bedtime for 1 week then increase to 2 pills at bedtime., Disp: 30 tablet, Rfl: 11   fidaxomicin (DIFICID) 200 MG TABS tablet, Take 1 tablet (200 mg total) by mouth 2 (two) times daily., Disp: 20 tablet, Rfl: 0   gabapentin (NEURONTIN) 300 MG capsule, Take 2 capsules (600 mg total) by mouth 3 (three) times daily., Disp: 540 capsule, Rfl: 4   glipiZIDE (GLUCOTROL XL) 10 MG 24 hr tablet, Take 10 mg by mouth 2 (two) times daily., Disp: , Rfl:    insulin glargine (  LANTUS) 100 UNIT/ML injection, Inject 20 Units into the skin 2 (two) times daily., Disp: , Rfl:    naloxone (NARCAN) 4 MG/0.1ML LIQD nasal spray kit, Place into the nose., Disp: , Rfl:    omeprazole (PRILOSEC) 20 MG capsule, Take 1 capsule (20 mg total) by mouth 2 (two) times daily before a meal., Disp: 40 capsule, Rfl: 1   oxyCODONE-acetaminophen (PERCOCET/ROXICET) 5-325 MG tablet, Take 2 tablets by mouth 3 (three) times daily as needed. , Disp: , Rfl:    tiZANidine (ZANAFLEX) 2 MG tablet, Take 2 mg by mouth 2 (two) times a day., Disp: , Rfl:    trazodone (DESYREL) 300 MG tablet, Take 300 mg by mouth at bedtime., Disp: , Rfl:    zolpidem (AMBIEN CR) 12.5 MG CR tablet, Take 12.5 mg by mouth at bedtime., Disp: , Rfl:     Review of Systems  Constitutional:  Negative for activity change, appetite change, chills, diaphoresis, fatigue, fever and unexpected weight change.  HENT:  Negative for congestion, rhinorrhea, sinus pressure, sneezing, sore throat and trouble swallowing.   Eyes:  Negative for photophobia and visual disturbance.  Respiratory:  Negative for cough, chest tightness, shortness of breath, wheezing and stridor.   Cardiovascular:  Negative for chest pain, palpitations and leg swelling.  Gastrointestinal:  Positive for nausea. Negative for abdominal distention, abdominal pain, anal bleeding, blood in stool, constipation, diarrhea and vomiting.  Genitourinary:  Negative for difficulty urinating, dysuria, flank pain  and hematuria.  Musculoskeletal:  Positive for arthralgias and myalgias. Negative for back pain, gait problem and joint swelling.  Skin:  Negative for color change, pallor, rash and wound.  Neurological:  Negative for dizziness, tremors, weakness and light-headedness.  Hematological:  Negative for adenopathy. Does not bruise/bleed easily.  Psychiatric/Behavioral:  Negative for agitation, behavioral problems, confusion, decreased concentration, dysphoric mood and sleep disturbance.       Objective:   Physical Exam Constitutional:      Appearance: He is well-developed.  HENT:     Head: Normocephalic and atraumatic.  Eyes:     Conjunctiva/sclera: Conjunctivae normal.  Cardiovascular:     Rate and Rhythm: Normal rate and regular rhythm.  Pulmonary:     Effort: Pulmonary effort is normal. No respiratory distress.     Breath sounds: No wheezing.  Abdominal:     General: There is no distension.     Palpations: Abdomen is soft.  Musculoskeletal:     Right shoulder: Tenderness present. Decreased range of motion.     Left shoulder: Normal.     Right elbow: No effusion. Decreased range of motion. Tenderness present.     Left elbow: No effusion. Tenderness present.     Right wrist: No effusion. Decreased range of motion.     Left wrist: No effusion. Decreased range of motion.  Skin:    General: Skin is warm and dry.     Coloration: Skin is not pale.     Findings: No erythema or rash.  Neurological:     General: No focal deficit present.     Mental Status: He is alert and oriented to person, place, and time.  Psychiatric:        Mood and Affect: Mood normal.        Behavior: Behavior normal.        Thought Content: Thought content normal.        Judgment: Judgment normal.          Assessment & Plan:   Polyarthritis, arthralgias:  worsening  I would be concerned that he potentially would have a systemic infection.  His blood cultures however that I took have been negative to  date.  I would like him to see his Rheumatologist though I would think it highly unusual for him to have an auto-immune condition fasting while he is on Actemra.  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  I do not think he has any evidence of recurrence he does have the polyarthritis and arthralgias now but I do not think that is directly related to recurrence of infection in the elbow.  Diarrhea:  I reviewed plain films from March 14, 2021 that do not show any evidence of intra-abdominal pathology CBC was relatively normal other than slight polycythemia CMP relatively normal other than slight elevation in ALT on labs that we ordered last week.  We tried obtain stool studies but by the time he could potentially provide stool he had began Dificid and is diarrhea had abated  If that was the only symptom he had I would think we had the right diagnosis with C. difficile colitis that was responding to Dificid but I am puzzled by his polyarthritis   Retinal vasculitis: On Actemra By [facility] ophthalmology

## 2021-03-16 LAB — CULTURE, BLOOD (SINGLE)
MICRO NUMBER:: 12292953
MICRO NUMBER:: 12292954
Result:: NO GROWTH
SPECIMEN QUALITY:: ADEQUATE

## 2021-03-16 LAB — COMPLETE METABOLIC PANEL WITH GFR
AG Ratio: 1.8 (calc) (ref 1.0–2.5)
ALT: 48 U/L — ABNORMAL HIGH (ref 9–46)
AST: 32 U/L (ref 10–35)
Albumin: 4.8 g/dL (ref 3.6–5.1)
Alkaline phosphatase (APISO): 55 U/L (ref 35–144)
BUN: 15 mg/dL (ref 7–25)
CO2: 28 mmol/L (ref 20–32)
Calcium: 10.5 mg/dL — ABNORMAL HIGH (ref 8.6–10.3)
Chloride: 104 mmol/L (ref 98–110)
Creat: 1.24 mg/dL (ref 0.70–1.35)
Globulin: 2.6 g/dL (calc) (ref 1.9–3.7)
Glucose, Bld: 84 mg/dL (ref 65–99)
Potassium: 4.4 mmol/L (ref 3.5–5.3)
Sodium: 141 mmol/L (ref 135–146)
Total Bilirubin: 0.9 mg/dL (ref 0.2–1.2)
Total Protein: 7.4 g/dL (ref 6.1–8.1)
eGFR: 64 mL/min/{1.73_m2} (ref >=60)

## 2021-03-16 LAB — CBC WITH DIFFERENTIAL/PLATELET
Absolute Monocytes: 800 cells/uL (ref 200–950)
Basophils Absolute: 120 cells/uL (ref 0–200)
Basophils Relative: 1.5 %
Eosinophils Absolute: 264 cells/uL (ref 15–500)
Eosinophils Relative: 3.3 %
HCT: 50 % (ref 38.5–50.0)
Hemoglobin: 17.2 g/dL — ABNORMAL HIGH (ref 13.2–17.1)
Lymphs Abs: 3512 cells/uL (ref 850–3900)
MCH: 30.3 pg (ref 27.0–33.0)
MCHC: 34.4 g/dL (ref 32.0–36.0)
MCV: 88 fL (ref 80.0–100.0)
MPV: 8.7 fL (ref 7.5–12.5)
Monocytes Relative: 10 %
Neutro Abs: 3304 cells/uL (ref 1500–7800)
Neutrophils Relative %: 41.3 %
Platelets: 385 10*3/uL (ref 140–400)
RBC: 5.68 10*6/uL (ref 4.20–5.80)
RDW: 11.9 % (ref 11.0–15.0)
Total Lymphocyte: 43.9 %
WBC: 8 10*3/uL (ref 3.8–10.8)

## 2021-03-25 ENCOUNTER — Telehealth: Payer: Self-pay

## 2021-03-25 NOTE — Telephone Encounter (Signed)
[facility] Patient Advocate Encounter  Completed and sent [facility] application for DIFICID for this patient who is uninsured.    Patient is approved 03/25/21 through 07/16/21  I faxed patient application to [phone] and mailed application to [address] [place] , [place] [address]  Patient will get medication mailed to him on 03/26/21  Clearance Coots, CPhT [facility] Patient Advocate [facility] Phone: [phone] Fax:  [phone]

## 2021-03-28 ENCOUNTER — Telehealth: Payer: Self-pay | Admitting: Neurology

## 2021-03-28 NOTE — Telephone Encounter (Signed)
Jasmine December from [employer] [employer] called wanting to know if it would be okay to leave the Meloxicam out of the NONFORMULARY OR COMPOUNDED ITEM. Pt is allergic to Ibuprofen and the Meloxicam is another form of it, thinks pt might have an allergic reaction to it also. Call back number is [phone]

## 2021-03-28 NOTE — Telephone Encounter (Signed)
Spoke with Jasmine December at [facility] [phone] advised per Dr Lucia Gaskins, okay to leave out the Meloxicam.  She verbalized understanding and appreciation.

## 2021-03-30 NOTE — Telephone Encounter (Signed)
Received fax update on TT formula for pt.  Doxepin 3%, amantadine 3%. Dextromethorphan 2 %, lidocaine 2%  [phone], fax [phone].

## 2021-04-11 DIAGNOSIS — M25461 Effusion, right knee: Secondary | ICD-10-CM | POA: Diagnosis not present

## 2021-04-20 DIAGNOSIS — Z0001 Encounter for general adult medical examination with abnormal findings: Secondary | ICD-10-CM | POA: Diagnosis not present

## 2021-04-20 DIAGNOSIS — E7849 Other hyperlipidemia: Secondary | ICD-10-CM | POA: Diagnosis not present

## 2021-04-20 DIAGNOSIS — E1140 Type 2 diabetes mellitus with diabetic neuropathy, unspecified: Secondary | ICD-10-CM | POA: Diagnosis not present

## 2021-04-20 DIAGNOSIS — F419 Anxiety disorder, unspecified: Secondary | ICD-10-CM | POA: Diagnosis not present

## 2021-05-02 DIAGNOSIS — M25561 Pain in right knee: Secondary | ICD-10-CM | POA: Diagnosis not present

## 2021-05-02 DIAGNOSIS — M25461 Effusion, right knee: Secondary | ICD-10-CM | POA: Diagnosis not present

## 2021-05-06 DIAGNOSIS — S83281A Other tear of lateral meniscus, current injury, right knee, initial encounter: Secondary | ICD-10-CM | POA: Diagnosis not present

## 2021-05-06 DIAGNOSIS — S83241A Other tear of medial meniscus, current injury, right knee, initial encounter: Secondary | ICD-10-CM | POA: Diagnosis not present

## 2021-05-06 DIAGNOSIS — Z9889 Other specified postprocedural states: Secondary | ICD-10-CM | POA: Diagnosis not present

## 2021-05-06 DIAGNOSIS — I739 Peripheral vascular disease, unspecified: Secondary | ICD-10-CM | POA: Diagnosis not present

## 2021-05-06 DIAGNOSIS — M25561 Pain in right knee: Secondary | ICD-10-CM | POA: Diagnosis not present

## 2021-05-12 DIAGNOSIS — M25561 Pain in right knee: Secondary | ICD-10-CM | POA: Diagnosis not present

## 2021-05-12 DIAGNOSIS — M1711 Unilateral primary osteoarthritis, right knee: Secondary | ICD-10-CM | POA: Diagnosis not present

## 2021-05-30 DIAGNOSIS — M25561 Pain in right knee: Secondary | ICD-10-CM | POA: Diagnosis not present

## 2021-05-30 DIAGNOSIS — M1711 Unilateral primary osteoarthritis, right knee: Secondary | ICD-10-CM | POA: Diagnosis not present

## 2021-06-01 ENCOUNTER — Telehealth: Payer: Self-pay | Admitting: Neurology

## 2021-06-01 NOTE — Telephone Encounter (Signed)
Andrey Campanile, RN ask message be sent back to work on rescheduling patient's injection appt.

## 2021-06-02 NOTE — Telephone Encounter (Signed)
Cancelled the 06-06-21 appt. Need to fill when has cancellation.

## 2021-06-06 ENCOUNTER — Ambulatory Visit: Payer: Medicare PPO | Admitting: Neurology

## 2021-06-06 DIAGNOSIS — M1711 Unilateral primary osteoarthritis, right knee: Secondary | ICD-10-CM | POA: Diagnosis not present

## 2021-06-06 DIAGNOSIS — M25561 Pain in right knee: Secondary | ICD-10-CM | POA: Diagnosis not present

## 2021-06-13 DIAGNOSIS — M25561 Pain in right knee: Secondary | ICD-10-CM | POA: Diagnosis not present

## 2021-06-13 DIAGNOSIS — M1711 Unilateral primary osteoarthritis, right knee: Secondary | ICD-10-CM | POA: Diagnosis not present

## 2021-06-16 ENCOUNTER — Other Ambulatory Visit: Payer: Self-pay

## 2021-06-16 ENCOUNTER — Ambulatory Visit: Payer: Medicare PPO | Admitting: Neurology

## 2021-06-16 DIAGNOSIS — G500 Trigeminal neuralgia: Secondary | ICD-10-CM | POA: Diagnosis not present

## 2021-06-16 DIAGNOSIS — G43001 Migraine without aura, not intractable, with status migrainosus: Secondary | ICD-10-CM | POA: Diagnosis not present

## 2021-06-16 DIAGNOSIS — G43711 Chronic migraine without aura, intractable, with status migrainosus: Secondary | ICD-10-CM | POA: Diagnosis not present

## 2021-06-16 DIAGNOSIS — M5481 Occipital neuralgia: Secondary | ICD-10-CM | POA: Diagnosis not present

## 2021-06-16 DIAGNOSIS — R519 Headache, unspecified: Secondary | ICD-10-CM

## 2021-06-16 DIAGNOSIS — G43709 Chronic migraine without aura, not intractable, without status migrainosus: Secondary | ICD-10-CM

## 2021-06-16 NOTE — Progress Notes (Signed)
Nerve block ( w/o) steroid: Pt signed consent: yes   0.5% Bupivocaine 71mL LOT: [id number] EXP: 01/26 NDC: 81448-185-63

## 2021-06-16 NOTE — Progress Notes (Signed)
Performed by Dr. Lucia Gaskins M.D. 30-gauge needle was used. All procedures a documented blood were medically necessary, reasonable and appropriate based on the patient's history, medical diagnosis and physician opinion. Verbal informed consent was obtained from the patient, patient was informed of potential risk of procedure, including bruising, bleeding, hematoma formation, infection, muscle weakness, muscle pain, numbness, transient hypertension, transient hyperglycemia and transient insomnia among others. All areas injected were topically clean with isopropyl rubbing alcohol. Nonsterile nonlatex gloves were worn during the procedure.  1. Greater occipital nerve block [phone]). The greater occipital nerve site was identified at the nuchal line medial to the occipital artery. Medication was injected into the left occipital nerve areas and suboccipital areas. Patient's condition is associated with inflammation of the greater occipital nerve and associated multiple groups. Injection was deemed medically necessary, reasonable and appropriate. Injection represents a separate and unique surgical service.  2. Supraorbital nerve block ([id number]): Supraorbital nerve site was identified along the incision of the frontal bone on the orbital/supraorbital ridge. Medication was injected into the right supraorbital nerve areas. Patient's condition is associated with inflammation of the supraorbital and associated muscle groups. Injection was deemed medically necessary, reasonable and appropriate. Injection represents a separate and unique surgical service.  3. 20553: Bilateral trapezius, bilateral levator scapulae, bilateral cervical paraspinal muscles 2 levels

## 2021-06-27 DIAGNOSIS — E7849 Other hyperlipidemia: Secondary | ICD-10-CM | POA: Diagnosis not present

## 2021-06-27 DIAGNOSIS — K2100 Gastro-esophageal reflux disease with esophagitis, without bleeding: Secondary | ICD-10-CM | POA: Diagnosis not present

## 2021-06-27 DIAGNOSIS — E1165 Type 2 diabetes mellitus with hyperglycemia: Secondary | ICD-10-CM | POA: Diagnosis not present

## 2021-06-27 DIAGNOSIS — E782 Mixed hyperlipidemia: Secondary | ICD-10-CM | POA: Diagnosis not present

## 2021-07-21 DIAGNOSIS — Z6824 Body mass index (BMI) 24.0-24.9, adult: Secondary | ICD-10-CM | POA: Diagnosis not present

## 2021-07-21 DIAGNOSIS — F32A Depression, unspecified: Secondary | ICD-10-CM | POA: Diagnosis not present

## 2021-07-21 DIAGNOSIS — E1169 Type 2 diabetes mellitus with other specified complication: Secondary | ICD-10-CM | POA: Diagnosis not present

## 2021-07-21 DIAGNOSIS — E7801 Familial hypercholesterolemia: Secondary | ICD-10-CM | POA: Diagnosis not present

## 2021-07-25 DIAGNOSIS — M65311 Trigger thumb, right thumb: Secondary | ICD-10-CM | POA: Diagnosis not present

## 2021-07-25 DIAGNOSIS — M79644 Pain in right finger(s): Secondary | ICD-10-CM | POA: Diagnosis not present

## 2021-07-28 ENCOUNTER — Encounter: Payer: Self-pay | Admitting: *Deleted

## 2021-08-02 ENCOUNTER — Other Ambulatory Visit: Payer: Self-pay

## 2021-08-02 ENCOUNTER — Ambulatory Visit: Payer: Medicare PPO | Admitting: Neurology

## 2021-08-02 DIAGNOSIS — G43001 Migraine without aura, not intractable, with status migrainosus: Secondary | ICD-10-CM | POA: Diagnosis not present

## 2021-08-02 DIAGNOSIS — R519 Headache, unspecified: Secondary | ICD-10-CM

## 2021-08-02 NOTE — Progress Notes (Signed)
Nerve block  (w/o) steroid: ?Pt signed consent yes  ? ?0.5% Bupivocaine 18 mL ?LOT: [id number] ?EXP: 07/2024 ?NDC: 63323-467-01  ? ? ? ?

## 2021-08-02 NOTE — Progress Notes (Addendum)
Performed by Dr. Lucia Gaskins M.D. 30-gauge needle was used. All procedures a documented were medically necessary, reasonable and appropriate based on the patient's history, medical diagnosis and physician opinion. Verbal informed consent was obtained from the patient, patient was informed of potential risk of procedure, including bruising, bleeding, hematoma formation, infection, muscle weakness, muscle pain, numbness, transient hypertension, transient hyperglycemia and transient insomnia among others. All areas injected were topically clean with isopropyl rubbing alcohol. Nonsterile nonlatex gloves were worn during the procedure.  Supraorbital nerve block (64400): Supraorbital nerve site was identified along the incision of the frontal bone on the orbital/supraorbital ridge. Medication was injected into the right supraorbital nerve areas. Patient's condition is associated with inflammation of the supraorbital and associated muscle groups. Injection was deemed medically necessary, reasonable and appropriate. Injection represents a separate and unique surgical service.  Wasted 4 syringes, used 2 syringes 34ml bupivicaone.5%

## 2021-08-02 NOTE — Progress Notes (Signed)
Performed by Dr. Lucia Gaskins M.D. 30-gauge needle was used. All procedures a documented were medically necessary, reasonable and appropriate based on the patient's history, medical diagnosis and physician opinion. Verbal informed consent was obtained from the patient, patient was informed of potential risk of procedure, including bruising, bleeding, hematoma formation, infection, muscle weakness, muscle pain, numbness, transient hypertension, transient hyperglycemia and transient insomnia among others. All areas injected were topically clean with isopropyl rubbing alcohol. Nonsterile nonlatex gloves were worn during the procedure.  Supraorbital nerve block (64400): Supraorbital nerve site was identified along the incision of the frontal bone on the orbital/supraorbital ridge. Medication was injected into the right supraorbital nerve areas. Patient's condition is associated with inflammation of the supraorbital and associated muscle groups. Injection was deemed medically necessary, reasonable and appropriate. Injection represents a separate and unique surgical service.

## 2021-08-11 DIAGNOSIS — H52203 Unspecified astigmatism, bilateral: Secondary | ICD-10-CM | POA: Diagnosis not present

## 2021-08-11 DIAGNOSIS — H524 Presbyopia: Secondary | ICD-10-CM | POA: Diagnosis not present

## 2021-08-11 DIAGNOSIS — H5203 Hypermetropia, bilateral: Secondary | ICD-10-CM | POA: Diagnosis not present

## 2021-08-11 DIAGNOSIS — H47013 Ischemic optic neuropathy, bilateral: Secondary | ICD-10-CM | POA: Diagnosis not present

## 2021-08-19 DIAGNOSIS — Z01810 Encounter for preprocedural cardiovascular examination: Secondary | ICD-10-CM | POA: Diagnosis not present

## 2021-08-29 DIAGNOSIS — Z4542 Encounter for adjustment and management of neuropacemaker (brain) (peripheral nerve) (spinal cord): Secondary | ICD-10-CM | POA: Diagnosis not present

## 2021-10-11 ENCOUNTER — Encounter: Payer: Self-pay | Admitting: Neurology

## 2021-10-12 NOTE — Telephone Encounter (Signed)
Need to run this by Dr Lucia Gaskins but I have spoken with the infusion suite who has tentatively scheduled the patient for a Depacon 1 gram infusion on Thursday April 20th at 3:30 PM. If ok, we can do nerve block afterward at 4 pm.  ?

## 2021-10-31 ENCOUNTER — Ambulatory Visit: Payer: Medicare PPO | Admitting: Neurology

## 2021-10-31 DIAGNOSIS — R519 Headache, unspecified: Secondary | ICD-10-CM | POA: Diagnosis not present

## 2021-10-31 DIAGNOSIS — M5481 Occipital neuralgia: Secondary | ICD-10-CM

## 2021-10-31 DIAGNOSIS — G43711 Chronic migraine without aura, intractable, with status migrainosus: Secondary | ICD-10-CM

## 2021-10-31 DIAGNOSIS — G8929 Other chronic pain: Secondary | ICD-10-CM

## 2021-10-31 DIAGNOSIS — M316 Other giant cell arteritis: Secondary | ICD-10-CM | POA: Diagnosis not present

## 2021-10-31 DIAGNOSIS — G500 Trigeminal neuralgia: Secondary | ICD-10-CM | POA: Diagnosis not present

## 2021-10-31 DIAGNOSIS — G43001 Migraine without aura, not intractable, with status migrainosus: Secondary | ICD-10-CM | POA: Diagnosis not present

## 2021-10-31 NOTE — Progress Notes (Signed)
Nerve block  (w/o) steroid: ?Pt signed consent yes  ? ?0.5% Bupivocaine 18 mL ?LOT: [id number] ?EXP: 07/2024 ?[place]: [id number]  ? ? ? ?

## 2021-11-01 DIAGNOSIS — M316 Other giant cell arteritis: Secondary | ICD-10-CM | POA: Diagnosis not present

## 2021-11-01 NOTE — Telephone Encounter (Signed)
Confirmed with Kyle [name] in [facility] that Thursday June 1st at 330 is ok for Depacon infusion (followed by nerve block).  ?

## 2021-11-02 LAB — C-REACTIVE PROTEIN: CRP: <1 mg/L (ref 0–10)

## 2021-11-02 LAB — SEDIMENTATION RATE: Sed Rate: 2 mm/hr (ref 0–30)

## 2021-11-03 NOTE — Progress Notes (Signed)
Performed by Dr. Lucia Gaskins M.D.  All procedures a documented below were medically necessary, reasonable and appropriate based on the patient's history, medical diagnosis and physician opinion. Verbal informed consent was obtained from the patient, patient was informed of potential risk of procedure, including bruising, bleeding, hematoma formation, infection, muscle weakness, muscle pain, numbness, transient hypertension, transient hyperglycemia and transient insomnia among others. All areas injected were topically clean with isopropyl rubbing alcohol. Nonsterile nonlatex gloves were worn during the procedure. ? ?1. Greater occipital nerve block [phone]). The greater occipital nerve site was identified at the nuchal line medial to the occipital artery. Medication was injected into the left and right occipital nerve areas and suboccipital areas. Patient's condition is associated with inflammation of the greater occipital nerve and associated multiple groups. Injection was deemed medically necessary, reasonable and appropriate. Injection represents a separate and unique surgical service. ? ?2. Lesser occipital nerve block (64450). The lesser occipital nerve site was identified approximately 2 cm lateral to the greater occipital nerve. Occasion was injected into the left and right occipital nerve areas. Patient's condition is associated with inflammation of the lesser occipital nerve and associated muscle groups. Injection was deemed medically necessary, reasonable and appropriate. Injection represents a separate and unique surgical service. ? ? ?3. Auriculotemporal nerve block [phone]): The Auriculotemporal nerve site was identified along the posterior margin of the sternocleidomastoid muscle toward the base of the ear. Medication was injected into the left and right radicular temporal nerve areas. Patient's condition is associated with inflammation of the Auriculotemporal Nerve and associated muscle groups. Injection was  deemed medically necessary, reasonable and appropriate. Injection represents a separate and unique surgical service. ? ?4. Supraorbital nerve block (64400): Supraorbital nerve site was identified along the incision of the frontal bone on the orbital/supraorbital ridge. Medication was injected into the left and right supraorbital nerve areas. Patient's condition is associated with inflammation of the supraorbital and associated muscle groups. Injection was deemed medically necessary, reasonable and appropriate. Injection represents a separate and unique surgical service. ? ?5. 20553: paraspinal levels c4-c8 and trapezius both bilaterally.  ? ?

## 2021-11-21 DIAGNOSIS — H02401 Unspecified ptosis of right eyelid: Secondary | ICD-10-CM | POA: Diagnosis not present

## 2021-11-21 DIAGNOSIS — H468 Other optic neuritis: Secondary | ICD-10-CM | POA: Diagnosis not present

## 2021-11-21 DIAGNOSIS — Z961 Presence of intraocular lens: Secondary | ICD-10-CM | POA: Diagnosis not present

## 2021-12-13 DIAGNOSIS — D692 Other nonthrombocytopenic purpura: Secondary | ICD-10-CM | POA: Diagnosis not present

## 2021-12-13 DIAGNOSIS — Z1283 Encounter for screening for malignant neoplasm of skin: Secondary | ICD-10-CM | POA: Diagnosis not present

## 2021-12-13 DIAGNOSIS — D1801 Hemangioma of skin and subcutaneous tissue: Secondary | ICD-10-CM | POA: Diagnosis not present

## 2021-12-15 ENCOUNTER — Ambulatory Visit (INDEPENDENT_AMBULATORY_CARE_PROVIDER_SITE_OTHER): Payer: Medicare PPO | Admitting: Neurology

## 2021-12-15 VITALS — BP 159/88 | HR 63

## 2021-12-15 DIAGNOSIS — G500 Trigeminal neuralgia: Secondary | ICD-10-CM | POA: Diagnosis not present

## 2021-12-15 DIAGNOSIS — M5481 Occipital neuralgia: Secondary | ICD-10-CM

## 2021-12-15 DIAGNOSIS — G43709 Chronic migraine without aura, not intractable, without status migrainosus: Secondary | ICD-10-CM

## 2021-12-15 DIAGNOSIS — G43711 Chronic migraine without aura, intractable, with status migrainosus: Secondary | ICD-10-CM

## 2021-12-15 DIAGNOSIS — G43001 Migraine without aura, not intractable, with status migrainosus: Secondary | ICD-10-CM | POA: Diagnosis not present

## 2021-12-15 DIAGNOSIS — R519 Headache, unspecified: Secondary | ICD-10-CM

## 2021-12-15 NOTE — Progress Notes (Signed)
Nerve block w/o steroid: Pt signed consent  0.5% Bupivocaine 18 mL LOT: [id number] EXP: 01/26 NDC: 69629-528-41

## 2021-12-18 NOTE — Progress Notes (Signed)
Performed by Dr. Lucia Gaskins M.D.  All procedures a documented below were medically necessary, reasonable and appropriate based on the patient's history, medical diagnosis and physician opinion. Verbal informed consent was obtained from the patient, patient was informed of potential risk of procedure, including bruising, bleeding, hematoma formation, infection, muscle weakness, muscle pain, numbness, transient hypertension, transient hyperglycemia and transient insomnia among others. All areas injected were topically clean with isopropyl rubbing alcohol. Nonsterile nonlatex gloves were worn during the procedure.  1. Greater occipital nerve block [phone]). The greater occipital nerve site was identified at the nuchal line medial to the occipital artery. Medication was injected into the left occipital nerve areas and suboccipital areas. Patient's condition is associated with inflammation of the greater occipital nerve and associated multiple groups. Injection was deemed medically necessary, reasonable and appropriate. Injection represents a separate and unique surgical service.  2. Auriculotemporal nerve block (30160): The Auriculotemporal nerve site was identified along the posterior margin of the sternocleidomastoid muscle toward the base of the ear. Medication was injected into the right radicular temporal nerve areas. Patient's condition is associated with inflammation of the Auriculotemporal Nerve and associated muscle groups. Injection was deemed medically necessary, reasonable and appropriate. Injection represents a separate and unique surgical service.  3. Supraorbital nerve block (64400): Supraorbital nerve site was identified along the incision of the frontal bone on the orbital/supraorbital ridge. Medication was injected into the right supraorbital nerve areas. Patient's condition is associated with inflammation of the supraorbital and associated muscle groups. Injection was deemed medically necessary,  reasonable and appropriate. Injection represents a separate and unique surgical service.  4. 10932: paraspinal levels c4-c8 and trapezius right.

## 2021-12-21 DIAGNOSIS — J0100 Acute maxillary sinusitis, unspecified: Secondary | ICD-10-CM | POA: Diagnosis not present

## 2021-12-21 DIAGNOSIS — Z20828 Contact with and (suspected) exposure to other viral communicable diseases: Secondary | ICD-10-CM | POA: Diagnosis not present

## 2021-12-21 DIAGNOSIS — I10 Essential (primary) hypertension: Secondary | ICD-10-CM | POA: Diagnosis not present

## 2021-12-21 DIAGNOSIS — J029 Acute pharyngitis, unspecified: Secondary | ICD-10-CM | POA: Diagnosis not present

## 2021-12-21 DIAGNOSIS — R059 Cough, unspecified: Secondary | ICD-10-CM | POA: Diagnosis not present

## 2022-01-03 DIAGNOSIS — H47013 Ischemic optic neuropathy, bilateral: Secondary | ICD-10-CM | POA: Diagnosis not present

## 2022-01-23 DIAGNOSIS — M1711 Unilateral primary osteoarthritis, right knee: Secondary | ICD-10-CM | POA: Diagnosis not present

## 2022-02-07 DIAGNOSIS — H47013 Ischemic optic neuropathy, bilateral: Secondary | ICD-10-CM | POA: Diagnosis not present

## 2022-02-10 DIAGNOSIS — M1711 Unilateral primary osteoarthritis, right knee: Secondary | ICD-10-CM | POA: Diagnosis not present

## 2022-02-16 DIAGNOSIS — H47011 Ischemic optic neuropathy, right eye: Secondary | ICD-10-CM | POA: Diagnosis not present

## 2022-02-22 DIAGNOSIS — M948X6 Other specified disorders of cartilage, lower leg: Secondary | ICD-10-CM | POA: Diagnosis not present

## 2022-02-22 DIAGNOSIS — M94261 Chondromalacia, right knee: Secondary | ICD-10-CM | POA: Diagnosis not present

## 2022-02-22 DIAGNOSIS — Y999 Unspecified external cause status: Secondary | ICD-10-CM | POA: Diagnosis not present

## 2022-02-22 DIAGNOSIS — S83281A Other tear of lateral meniscus, current injury, right knee, initial encounter: Secondary | ICD-10-CM | POA: Diagnosis not present

## 2022-02-22 DIAGNOSIS — S83241A Other tear of medial meniscus, current injury, right knee, initial encounter: Secondary | ICD-10-CM | POA: Diagnosis not present

## 2022-02-22 DIAGNOSIS — X58XXXA Exposure to other specified factors, initial encounter: Secondary | ICD-10-CM | POA: Diagnosis not present

## 2022-04-24 ENCOUNTER — Encounter: Payer: Self-pay | Admitting: Neurology

## 2022-04-24 NOTE — Telephone Encounter (Signed)
I am checking with Maudie Mercury RN in [facility] to see what is available.

## 2022-05-01 ENCOUNTER — Ambulatory Visit (INDEPENDENT_AMBULATORY_CARE_PROVIDER_SITE_OTHER): Payer: Medicare PPO | Admitting: Neurology

## 2022-05-01 DIAGNOSIS — G43009 Migraine without aura, not intractable, without status migrainosus: Secondary | ICD-10-CM

## 2022-05-01 DIAGNOSIS — G43001 Migraine without aura, not intractable, with status migrainosus: Secondary | ICD-10-CM | POA: Diagnosis not present

## 2022-05-01 MED ORDER — BUPIVACAINE HCL 0.5 % IJ SOLN: 9.0000 mL | Freq: Once | INTRAMUSCULAR | Status: AC

## 2022-05-01 MED ORDER — QULIPTA 60 MG PO TABS: 60.0000 mg | ORAL_TABLET | Freq: Every day | ORAL | 11 refills | Status: DC

## 2022-05-01 NOTE — Progress Notes (Signed)
Performed by Dr. Jaynee Eagles M.D.  All procedures a documented below were medically necessary, reasonable and appropriate based on the patient's history, medical diagnosis and physician opinion. Verbal informed consent was obtained from the patient, patient was informed of potential risk of procedure, including bruising, bleeding, hematoma formation, infection, muscle weakness, muscle pain, numbness, transient hypertension, transient hyperglycemia and transient insomnia among others. All areas injected were topically clean with isopropyl rubbing alcohol. Nonsterile nonlatex gloves were worn during the procedure.  He has 6 migraine days a month and < 10 total headache days a month. Tried: topamax, depakote, metoprolol, amitriptyline, nortriptyline, ajovy, emgality, aimovig, ubrelvy,nurtec, cymbalta, celebrex, steroids,gabapentin,toradol,mobic,nerve blocks, tizanidine,trazodone. The only medication that has given him relief is qulipta great response.   1. Greater occipital nerve block [phone]). The greater occipital nerve site was identified at the nuchal line medial to the occipital artery. Medication was injected into the left occipital nerve areas and suboccipital areas. Patient's condition is associated with inflammation of the greater occipital nerve and associated multiple groups. Injection was deemed medically necessary, reasonable and appropriate. Injection represents a separate and unique surgical service.  2. Supraorbital nerve block (64400): Supraorbital nerve site was identified along the incision of the frontal bone on the orbital/supraorbital ridge. Medication was injected into the right supraorbital nerve areas. Patient's condition is associated with inflammation of the supraorbital and associated muscle groups. Injection was deemed medically necessary, reasonable and appropriate. Injection represents a separate and unique surgical service.

## 2022-05-01 NOTE — Progress Notes (Signed)
Nerve block  (w/o) steroid: without  Pt signed consent yes   0.5% Bupivacaine 12 mL LOT: [id number] EXP: 07/2024 NDC: 02637-858-85   60ml 2% [id number] 08/2022  [phone]

## 2022-05-09 ENCOUNTER — Encounter: Payer: Self-pay | Admitting: Neurology

## 2022-05-09 DIAGNOSIS — G43009 Migraine without aura, not intractable, without status migrainosus: Secondary | ICD-10-CM

## 2022-05-09 MED ORDER — QULIPTA 60 MG PO TABS: 60.0000 mg | ORAL_TABLET | Freq: Every day | ORAL | 11 refills | Status: DC

## 2022-05-11 ENCOUNTER — Telehealth: Payer: Self-pay | Admitting: *Deleted

## 2022-05-11 ENCOUNTER — Encounter: Payer: Self-pay | Admitting: *Deleted

## 2022-05-11 NOTE — Telephone Encounter (Signed)
Lenoria Chime PA, Key: [facility]  Immediately approved. PA Case: [id number], Status: Approved, Coverage Starts on: 07/17/2021 12:00:00 AM, Coverage Ends on: 07/17/2023 12:00:00 AM. Questions? Contact [phone]

## 2022-05-25 DIAGNOSIS — S91312A Laceration without foreign body, left foot, initial encounter: Secondary | ICD-10-CM | POA: Diagnosis not present

## 2022-05-30 ENCOUNTER — Ambulatory Visit (INDEPENDENT_AMBULATORY_CARE_PROVIDER_SITE_OTHER): Payer: Medicare PPO | Admitting: Neurology

## 2022-05-30 VITALS — BP 167/100 | HR 67

## 2022-05-30 DIAGNOSIS — G43009 Migraine without aura, not intractable, without status migrainosus: Secondary | ICD-10-CM

## 2022-05-30 DIAGNOSIS — G43001 Migraine without aura, not intractable, with status migrainosus: Secondary | ICD-10-CM | POA: Diagnosis not present

## 2022-05-30 MED ORDER — LAMOTRIGINE ER 50 MG PO TB24: ORAL_TABLET | ORAL | 6 refills | Status: DC

## 2022-05-30 MED ORDER — LIDOCAINE HCL 2 % IJ SOLN: 3.0000 mL | Freq: Once | INTRAMUSCULAR | Status: AC

## 2022-05-30 MED ORDER — BUPIVACAINE HCL 0.5 % IJ SOLN: 9.0000 mL | Freq: Once | INTRAMUSCULAR | Status: AC

## 2022-05-30 NOTE — Patient Instructions (Signed)
Lamotrigine Tablets What is this medication? LAMOTRIGINE (la MOE Patrecia Pace)  It works by calming overactive nerves in your body. This medicine may be used for other purposes; ask your health care provider or pharmacist if you have questions. COMMON BRAND NAME(S): Lamictal, Subvenite What should I tell my care team before I take this medication? They need to know if you have any of these conditions: Heart disease History of irregular heartbeat Immune system problems Kidney disease Liver disease Low levels of folic acid in the blood Lupus Mental health condition Suicidal thoughts, plans, or attempt by you or a family member An unusual or allergic reaction to lamotrigine, other medications, foods, dyes, or preservatives Pregnant or trying to get pregnant Breastfeeding How should I use this medication? Take this medication by mouth with a glass of water. Follow the directions on the prescription label. Do not chew these tablets. If this medication upsets your stomach, take it with food or milk. Take your doses at regular intervals. Do not take your medication more often than directed. A special MedGuide will be given to you by the pharmacist with each new prescription and refill. Be sure to read this information carefully each time. Talk to your care team about the use of this medication in children. While this medication may be prescribed for children as young as 2 years for selected conditions, precautions do apply. Overdosage: If you think you have taken too much of this medicine contact a poison control center or emergency room at once. NOTE: This medicine is only for you. Do not share this medicine with others. What if I miss a dose? If you miss a dose, take it as soon as you can. If it is almost time for your next dose, take only that dose. Do not take double or extra doses. What may interact with this medication? Atazanavir Certain medications for irregular heartbeat Certain  medications for seizures, such as carbamazepine, phenobarbital, phenytoin, primidone, or valproic acid Estrogen or progestin hormones Lopinavir Rifampin Ritonavir This list may not describe all possible interactions. Give your health care provider a list of all the medicines, herbs, non-prescription drugs, or dietary supplements you use. Also tell them if you smoke, drink alcohol, or use illegal drugs. Some items may interact with your medicine. What should I watch for while using this medication? Visit your care team for regular checks on your progress. If you take this medication for seizures, wear a Medic Alert bracelet or necklace. Carry an identification card with information about your condition, medications, and care team. It is important to take this medication exactly as directed. When first starting treatment, your dose will need to be adjusted slowly. It may take weeks or months before your dose is stable. You should contact your care team if your seizures get worse or if you have any new types of seizures. Do not stop taking this medication unless instructed by your care team. Stopping your medication suddenly can increase your seizures or their severity. This medication may cause serious skin reactions. They can happen weeks to months after starting the medication. Contact your care team right away if you notice fevers or flu-like symptoms with a rash. The rash may be red or purple and then turn into blisters or peeling of the skin. You may also notice a red rash with swelling of the face, lips, or lymph nodes in your neck or under your arms. This medication may affect your coordination, reaction time, or judgment. Do not drive or operate  machinery until you know how this medication affects you. Sit up or stand slowly to reduce the risk of dizzy or fainting spells. Drinking alcohol with this medication can increase the risk of these side effects. If you are taking this medication for bipolar  disorder, it is important to report any changes in your mood to your care team. If your condition gets worse, you get mentally depressed, feel very hyperactive or manic, have difficulty sleeping, or have thoughts of hurting yourself or committing suicide, you need to get help from your care team right away. If you are a caregiver for someone taking this medication for bipolar disorder, you should also report these behavioral changes right away. The use of this medication may increase the chance of suicidal thoughts or actions. Pay special attention to how you are responding while on this medication. Your mouth may get dry. Chewing sugarless gum or sucking hard candy and drinking plenty of water may help. Contact your care team if the problem does not go away or is severe. If you become pregnant while using this medication, you may enroll in the [place] [employer] Antiepileptic Drug Pregnancy Registry by calling [phone]. This registry collects information about the safety of antiepileptic medication use during pregnancy. This medication may cause a decrease in folic acid. You should make sure that you get enough folic acid while you are taking this medication. Discuss the foods you eat and the vitamins you take with your care team. What side effects may I notice from receiving this medication? Side effects that you should report to your care team as soon as possible: Allergic reactions--skin rash, itching, hives, swelling of the face, lips, tongue, or throat Change in vision Fever, neck pain or stiffness, sensitivity to light, headache, nausea, vomiting, confusion Heart rhythm changes--fast or irregular heartbeat, dizziness, feeling faint or lightheaded, chest pain, trouble breathing Infection--fever, chills, cough, or sore throat Liver injury--right upper belly pain, loss of appetite, nausea, light-colored stool, dark yellow or brown urine, yellowing skin or eyes, unusual weakness or fatigue Low red  blood cell count--unusual weakness or fatigue, dizziness, headache, trouble breathing Rash, fever, and swollen lymph nodes Redness, blistering, peeling or loosening of the skin, including inside the mouth Thoughts of suicide or self-harm, worsening mood, or feelings of depression Unusual bruising or bleeding Side effects that usually do not require medical attention (report to your care team if they continue or are bothersome): Diarrhea Dizziness Drowsiness Headache Nausea Stomach pain Tremors or shaking This list may not describe all possible side effects. Call your doctor for medical advice about side effects. You may report side effects to FDA at 1-800-FDA-1088. Where should I keep my medication? Keep out of the reach of children and pets. Store at [employer] C (77 degrees F). Protect from light. Get rid of any unused medication after the expiration date. To get rid of medications that are no longer needed or have expired: Take the medication to a medication take-back program. Check with your pharmacy or law enforcement to find a location. If you cannot return the medication, check the label or package insert to see if the medication should be thrown out in the garbage or flushed down the toilet. If you are not sure, ask your care team. If it is safe to put it in the trash, empty the medication out of the container. Mix the medication with cat litter, dirt, coffee grounds, or other unwanted substance. Seal the mixture in a bag or container. Put it in the  trash. NOTE: This sheet is a summary. It may not cover all possible information. If you have questions about this medicine, talk to your doctor, pharmacist, or health care provider.  2023 Elsevier/Gold Standard (2020-10-15 00:00:00)

## 2022-05-30 NOTE — Progress Notes (Signed)
Nerve block   steroid: without Pt signed consent yes   0.5% Bupivocaine  9 mL LOT: [id number] EXP: 01/26 NDC: 25749-355-21  2% Lidocaine 3 mL LOT: [id number] EXP: 08/2022 NDC: 39672-897-91

## 2022-05-30 NOTE — Progress Notes (Signed)
Performed by Dr. Lucia Gaskins M.D.  All procedures a documented below were medically necessary, reasonable and appropriate based on the patient's history, medical diagnosis and physician opinion. Verbal informed consent was obtained from the patient, patient was informed of potential risk of procedure, including bruising, bleeding, hematoma formation, infection, muscle weakness, muscle pain, numbness, transient hypertension, transient hyperglycemia and transient insomnia among others. All areas injected were topically clean with isopropyl rubbing alcohol. Nonsterile nonlatex gloves were worn during the procedure.  He has 6 migraine days a month and < 10 total headache days a month. Tried: topamax, depakote, metoprolol, amitriptyline, nortriptyline, ajovy, emgality, aimovig, ubrelvy,nurtec, cymbalta, celebrex, steroids,gabapentin,toradol,mobic,nerve blocks, tizanidine,trazodone. The only medication that has given him relief is qulipta great response.   Today he has a horrible headache. 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 We reviewed it, I gave them literature and we decided to try lamictal then lithium he has anaphylaxis with nsaids can't use indomethacin  Meds ordered this encounter  Medications   bupivacaine (MARCAINE) 0.5 % (with pres) injection 9 mL    Nerve block   steroid: without Pt signed consent yes   0.5% Bupivocaine  9 mL LOT: [id number] EXP: 01/26 NDC: 17494-496-75  2% Lidocaine 3 mL LOT: [id number] EXP: 08/2022 NDC: 65993-570-17   lidocaine (XYLOCAINE) 2 % (with pres) injection 60 mg   lamoTRIgine (LAMICTAL XR) 50 MG 24 hour tablet    Sig: Start with one pill at bedtime(50mg ). In 2 weeks increase to 2 pills at bedtime (100mg ). Stop for RASH or other side effects    Dispense:  60 tablet    Refill:  6     1. Supraorbital nerve block (64400): Supraorbital nerve site was identified along the incision of the frontal bone on the orbital/supraorbital ridge. Medication was  injected into the right supraorbital nerve areas. Patient's condition is associated with inflammation of the supraorbital and associated muscle groups. Injection was deemed medically necessary, reasonable and appropriate. Injection represents a separate and unique surgical service.  I spent 30 minutes of face-to-face and non-face-to-face time with patient on the  1. Migraine without aura and without status migrainosus, not intractable    diagnosis.  This included previsit chart review, lab review, study review, order entry, electronic health record documentation, patient education on the different diagnostic and therapeutic options, counseling and coordination of care, risks and benefits of management, compliance, or risk factor reduction

## 2022-06-02 DIAGNOSIS — Z6824 Body mass index (BMI) 24.0-24.9, adult: Secondary | ICD-10-CM | POA: Diagnosis not present

## 2022-06-02 DIAGNOSIS — S91119A Laceration without foreign body of unspecified toe without damage to nail, initial encounter: Secondary | ICD-10-CM | POA: Diagnosis not present

## 2022-06-02 DIAGNOSIS — L03119 Cellulitis of unspecified part of limb: Secondary | ICD-10-CM | POA: Diagnosis not present

## 2022-06-02 DIAGNOSIS — R030 Elevated blood-pressure reading, without diagnosis of hypertension: Secondary | ICD-10-CM | POA: Diagnosis not present

## 2022-06-19 DIAGNOSIS — E1169 Type 2 diabetes mellitus with other specified complication: Secondary | ICD-10-CM | POA: Diagnosis not present

## 2022-06-19 DIAGNOSIS — E7849 Other hyperlipidemia: Secondary | ICD-10-CM | POA: Diagnosis not present

## 2022-06-19 DIAGNOSIS — R5383 Other fatigue: Secondary | ICD-10-CM | POA: Diagnosis not present

## 2022-06-19 DIAGNOSIS — E7801 Familial hypercholesterolemia: Secondary | ICD-10-CM | POA: Diagnosis not present

## 2022-06-19 DIAGNOSIS — E7800 Pure hypercholesterolemia, unspecified: Secondary | ICD-10-CM | POA: Diagnosis not present

## 2022-06-19 DIAGNOSIS — E1165 Type 2 diabetes mellitus with hyperglycemia: Secondary | ICD-10-CM | POA: Diagnosis not present

## 2022-06-19 DIAGNOSIS — Z13220 Encounter for screening for lipoid disorders: Secondary | ICD-10-CM | POA: Diagnosis not present

## 2022-06-19 DIAGNOSIS — K219 Gastro-esophageal reflux disease without esophagitis: Secondary | ICD-10-CM | POA: Diagnosis not present

## 2022-06-19 DIAGNOSIS — E1140 Type 2 diabetes mellitus with diabetic neuropathy, unspecified: Secondary | ICD-10-CM | POA: Diagnosis not present

## 2022-06-19 DIAGNOSIS — E559 Vitamin D deficiency, unspecified: Secondary | ICD-10-CM | POA: Diagnosis not present

## 2022-06-19 DIAGNOSIS — E782 Mixed hyperlipidemia: Secondary | ICD-10-CM | POA: Diagnosis not present

## 2022-06-22 DIAGNOSIS — F32A Depression, unspecified: Secondary | ICD-10-CM | POA: Diagnosis not present

## 2022-06-22 DIAGNOSIS — Z6824 Body mass index (BMI) 24.0-24.9, adult: Secondary | ICD-10-CM | POA: Diagnosis not present

## 2022-06-22 DIAGNOSIS — M542 Cervicalgia: Secondary | ICD-10-CM | POA: Diagnosis not present

## 2022-06-22 DIAGNOSIS — E1169 Type 2 diabetes mellitus with other specified complication: Secondary | ICD-10-CM | POA: Diagnosis not present

## 2022-06-22 DIAGNOSIS — E7801 Familial hypercholesterolemia: Secondary | ICD-10-CM | POA: Diagnosis not present

## 2022-06-22 DIAGNOSIS — M25511 Pain in right shoulder: Secondary | ICD-10-CM | POA: Diagnosis not present

## 2022-06-22 DIAGNOSIS — G8929 Other chronic pain: Secondary | ICD-10-CM | POA: Diagnosis not present

## 2022-06-22 DIAGNOSIS — G43909 Migraine, unspecified, not intractable, without status migrainosus: Secondary | ICD-10-CM | POA: Diagnosis not present

## 2022-06-22 DIAGNOSIS — Z0001 Encounter for general adult medical examination with abnormal findings: Secondary | ICD-10-CM | POA: Diagnosis not present

## 2022-06-29 DIAGNOSIS — Z0001 Encounter for general adult medical examination with abnormal findings: Secondary | ICD-10-CM | POA: Diagnosis not present

## 2022-06-29 DIAGNOSIS — Z6824 Body mass index (BMI) 24.0-24.9, adult: Secondary | ICD-10-CM | POA: Diagnosis not present

## 2022-06-29 DIAGNOSIS — R030 Elevated blood-pressure reading, without diagnosis of hypertension: Secondary | ICD-10-CM | POA: Diagnosis not present

## 2022-06-29 DIAGNOSIS — E7849 Other hyperlipidemia: Secondary | ICD-10-CM | POA: Diagnosis not present

## 2022-06-29 DIAGNOSIS — E1140 Type 2 diabetes mellitus with diabetic neuropathy, unspecified: Secondary | ICD-10-CM | POA: Diagnosis not present

## 2022-06-29 DIAGNOSIS — Z23 Encounter for immunization: Secondary | ICD-10-CM | POA: Diagnosis not present

## 2022-06-30 ENCOUNTER — Emergency Department (HOSPITAL_COMMUNITY)
Admission: EM | Admit: 2022-06-30 | Discharge: 2022-06-30 | Discharge status: Against Medical Advice | Payer: Medicare PPO | Attending: Emergency Medicine | Admitting: Emergency Medicine

## 2022-06-30 ENCOUNTER — Encounter (HOSPITAL_COMMUNITY): Payer: Self-pay

## 2022-06-30 ENCOUNTER — Other Ambulatory Visit: Payer: Self-pay

## 2022-06-30 ENCOUNTER — Emergency Department (HOSPITAL_COMMUNITY): Payer: Medicare PPO

## 2022-06-30 DIAGNOSIS — M48061 Spinal stenosis, lumbar region without neurogenic claudication: Secondary | ICD-10-CM | POA: Diagnosis not present

## 2022-06-30 DIAGNOSIS — R531 Weakness: Secondary | ICD-10-CM | POA: Diagnosis not present

## 2022-06-30 DIAGNOSIS — M47817 Spondylosis without myelopathy or radiculopathy, lumbosacral region: Secondary | ICD-10-CM | POA: Diagnosis not present

## 2022-06-30 DIAGNOSIS — M545 Low back pain, unspecified: Secondary | ICD-10-CM | POA: Insufficient documentation

## 2022-06-30 DIAGNOSIS — S32030A Wedge compression fracture of third lumbar vertebra, initial encounter for closed fracture: Secondary | ICD-10-CM | POA: Diagnosis not present

## 2022-06-30 DIAGNOSIS — R030 Elevated blood-pressure reading, without diagnosis of hypertension: Secondary | ICD-10-CM | POA: Diagnosis not present

## 2022-06-30 DIAGNOSIS — I2510 Atherosclerotic heart disease of native coronary artery without angina pectoris: Secondary | ICD-10-CM | POA: Diagnosis not present

## 2022-06-30 DIAGNOSIS — M47816 Spondylosis without myelopathy or radiculopathy, lumbar region: Secondary | ICD-10-CM | POA: Diagnosis not present

## 2022-06-30 DIAGNOSIS — Z6824 Body mass index (BMI) 24.0-24.9, adult: Secondary | ICD-10-CM | POA: Diagnosis not present

## 2022-06-30 DIAGNOSIS — M4807 Spinal stenosis, lumbosacral region: Secondary | ICD-10-CM | POA: Diagnosis not present

## 2022-06-30 DIAGNOSIS — S32000A Wedge compression fracture of unspecified lumbar vertebra, initial encounter for closed fracture: Secondary | ICD-10-CM | POA: Diagnosis not present

## 2022-06-30 LAB — BASIC METABOLIC PANEL
Anion gap: 11 (ref 5–15)
BUN: 14 mg/dL (ref 8–23)
CO2: 27 mmol/L (ref 22–32)
Calcium: 9.6 mg/dL (ref 8.9–10.3)
Chloride: 99 mmol/L (ref 98–111)
Creatinine, Ser: 1.06 mg/dL (ref 0.61–1.24)
GFR, Estimated: >60 mL/min (ref >60)
Glucose, Bld: 212 mg/dL — ABNORMAL HIGH (ref 70–99)
Potassium: 3.5 mmol/L (ref 3.5–5.1)
Sodium: 137 mmol/L (ref 135–145)

## 2022-06-30 LAB — CBC
HCT: 48.5 % (ref 39.0–52.0)
Hemoglobin: 17 g/dL (ref 13.0–17.0)
MCH: 31.7 pg (ref 26.0–34.0)
MCHC: 35.1 g/dL (ref 30.0–36.0)
MCV: 90.3 fL (ref 80.0–100.0)
Platelets: 273 10*3/uL (ref 150–400)
RBC: 5.37 MIL/uL (ref 4.22–5.81)
RDW: 12.2 % (ref 11.5–15.5)
WBC: 10.4 10*3/uL (ref 4.0–10.5)
nRBC: 0 % (ref 0.0–0.2)

## 2022-06-30 MED ORDER — FENTANYL CITRATE PF 50 MCG/ML IJ SOSY
50.0000 ug | PREFILLED_SYRINGE | Freq: Once | INTRAMUSCULAR | Status: AC
  Administered 2022-06-30: 50 ug via INTRAMUSCULAR
  Filled 2022-06-30: qty 1

## 2022-06-30 NOTE — ED Provider Triage Note (Signed)
Emergency Medicine Provider Triage Evaluation Note  AMEDEO DETWEILER , a 66 y.o. male  was evaluated in triage.  Pt complains of low back pain, worsening since Saturday. Patient with fall of porch. He was ambulatory until today when pain worsened. He is on oxy10mg  TID for chronic pain which is no longer helping his symptoms.  He denies any saddle anesthesia, urinary or fecal incontinence, numbness, tingling. patient was seen at an outpatient facility earlier today diagnosed with L3 compression fracture with 40% loss of height of the disc.  He was sent for further evaluation, neurosurgical consultation, and pain control.  The imaging was performed at an outside facility and we are unable to see the images.  Review of Systems  Positive: Compression fracture Negative: Saddle anesthesia  Physical Exam  BP (!) 155/95 (BP Location: Right Arm)   Pulse 98   Temp 98.4 F (36.9 C)   Resp 17   SpO2 99%  Gen:   Awake, no distress   Resp:  Normal effort  MSK:   Moves extremities without difficulty  Other:  Decreased strength bilateral lower extremity secondary to pain, but normal sensation throughout, significant tenderness palpation lumbar region at the midline.  Medical Decision Making  Medically screening exam initiated at 5:39 PM.  Appropriate orders placed.  Lafe Garin was informed that the remainder of the evaluation will be completed by another provider, this initial triage assessment does not replace that evaluation, and the importance of remaining in the ED until their evaluation is complete.  Workup initiated, basic blood work performed in case patient will need admission, I will monitor patient CT scan and contact neurosurgery once it is read   Olene Floss, PA-C 06/30/22 1954

## 2022-06-30 NOTE — ED Triage Notes (Signed)
Pt came in via POV d/t back pain caused by a fall he had on his front porch this past Saturday (06/24/22). Landing on his back on the right side. Pt endorses 10/10 stabbing/shooting pain in his lower back. Pt was seen by his PCP & a CT was done showing a L3 compression. Pt does have a spinal column stimulator from a past shoulder injury as well. Pt also reports anaphylactic allergy to Aspirin/anti-inflammatories. A/Ox4.

## 2022-06-30 NOTE — ED Notes (Signed)
Pt and wife arrived to [facility], stating they want to leave. Advised of risks of leaving, and made aware to check MyChart for ongoing results

## 2022-07-04 ENCOUNTER — Encounter: Payer: Self-pay | Admitting: Neurology

## 2022-07-25 ENCOUNTER — Other Ambulatory Visit (HOSPITAL_BASED_OUTPATIENT_CLINIC_OR_DEPARTMENT_OTHER): Payer: Self-pay

## 2022-07-26 DIAGNOSIS — M8008XA Age-related osteoporosis with current pathological fracture, vertebra(e), initial encounter for fracture: Secondary | ICD-10-CM | POA: Diagnosis not present

## 2022-08-31 ENCOUNTER — Encounter: Payer: Self-pay | Admitting: Neurology

## 2022-09-07 ENCOUNTER — Telehealth: Payer: Self-pay | Admitting: Neurology

## 2022-09-07 ENCOUNTER — Ambulatory Visit: Payer: Medicare PPO | Admitting: Neurology

## 2022-09-07 DIAGNOSIS — G43009 Migraine without aura, not intractable, without status migrainosus: Secondary | ICD-10-CM

## 2022-09-07 DIAGNOSIS — G43001 Migraine without aura, not intractable, with status migrainosus: Secondary | ICD-10-CM | POA: Diagnosis not present

## 2022-09-07 MED ORDER — LIDOCAINE HCL 2 % IJ SOLN
3.0000 mL | Freq: Once | INTRAMUSCULAR | Status: AC
Start: 2022-09-07 — End: ?

## 2022-09-07 MED ORDER — BUPIVACAINE HCL 0.5 % IJ SOLN
18.0000 mL | Freq: Once | INTRAMUSCULAR | Status: AC
Start: 2022-09-07 — End: ?

## 2022-09-07 NOTE — Progress Notes (Signed)
Performed by Dr. Jaynee Eagles M.D.  All procedures a documented below were medically necessary, reasonable and appropriate based on the patient's history, medical diagnosis and physician opinion. Verbal informed consent was obtained from the patient, patient was informed of potential risk of procedure, including bruising, bleeding, hematoma formation, infection, muscle weakness, muscle pain, numbness, transient hypertension, transient hyperglycemia and transient insomnia among others. All areas injected were topically clean with isopropyl rubbing alcohol. Nonsterile nonlatex gloves were worn during the procedure.  He has 15 migraine days a month and >15 total headache days a month for last 3 months or longer. Tried: topamax, depakote, metoprolol, amitriptyline, nortriptyline, ajovy, emgality, aimovig, ubrelvy,nurtec, cymbalta, celebrex, efffexor, steroids,gabapentin,toradol,mobic,nerve blocks, verapamil, candesartan, tizanidine,trazodone. The only medication that has given him some relief is qulipta. Try Vyepti. I spent 10 minutes of face-to-face and non-face-to-face time with patient on the  1. Migraine without aura and without status migrainosus, not intractable    diagnosis.  This included previsit chart review, lab review, study review, order entry, electronic health record documentation, patient education on the different diagnostic and therapeutic options, counseling and coordination of care, risks and benefits of management, compliance, or risk factor reduction. This does not include time on injections.   Chronic intractable migraine   30-gauge needle was used. All procedures a documented blood were medically necessary, reasonable and appropriate based on the patient's history, medical diagnosis and physician opinion. Verbal informed consent was obtained from the patient, patient was informed of potential risk of procedure, including bruising, bleeding, hematoma formation, infection, muscle weakness, muscle  pain, numbness, transient hypertension, transient hyperglycemia and transient insomnia among others. All areas injected were topically clean with isopropyl rubbing alcohol. Nonsterile nonlatex gloves were worn during the procedure.  1. Greater occipital nerve block [phone]). The greater occipital nerve site was identified at the nuchal line medial to the occipital artery. Medication was injected into the left and right occipital nerve areas and suboccipital areas. Patient's condition is associated with inflammation of the greater occipital nerve and associated multiple groups. Injection was deemed medically necessary, reasonable and appropriate. Injection represents a separate and unique surgical service.  2. Supraorbital nerve block (64400): Supraorbital nerve site was identified along the incision of the frontal bone on the orbital/supraorbital ridge. Medication was injected into the right supraorbital nerve areas. Patient's condition is associated with inflammation of the supraorbital and associated muscle groups. Injection was deemed medically necessary, reasonable and appropriate. Injection represents a separate and unique surgical service.   I spent 30 minutes of face-to-face and non-face-to-face time with patient on the  1. Migraine without aura and without status migrainosus, not intractable    diagnosis.  This included previsit chart review, lab review, study review, order entry, electronic health record documentation, patient education on the different diagnostic and therapeutic options, counseling and coordination of care, risks and benefits of management, compliance, or risk factor reduction

## 2022-09-07 NOTE — Telephone Encounter (Signed)
Order form written for Vyepti 300 mg IV every 3 months. Form is pending MD signature.

## 2022-09-07 NOTE — Telephone Encounter (Signed)
Order signed and given to [facility].

## 2022-09-07 NOTE — Telephone Encounter (Signed)
Please start vyepti. If we can start at 372m that would be great but if not start at 1053mthen move to 30010m Chronic intractable migraines  G43.711 He has 15 migraine days a month and >15 total headache days a month for last 3 months or longer. Tried: topamax, depakote, metoprolol, amitriptyline, nortriptyline, ajovy, emgality, aimovig, ubrelvy,nurtec, cymbalta, celebrex, efffexor, steroids,gabapentin,toradol,mobic,nerve blocks, verapamil, candesartan, tizanidine,trazodone. The only medication that has given him some relief is qulipta. Try Vyepti

## 2022-09-07 NOTE — Progress Notes (Signed)
Nerve block  (w/o) steroid:without Pt signed consent yes   0.5% Bupivocaine 18 mL LOT: [id number] EXP: 02/2025 NDC: [id number]  2% Lidocaine 3 mL [id number] EXP: 08/2022 NDC: [id number]

## 2022-09-26 DIAGNOSIS — M316 Other giant cell arteritis: Secondary | ICD-10-CM | POA: Diagnosis not present

## 2022-09-26 DIAGNOSIS — T39315A Adverse effect of propionic acid derivatives, initial encounter: Secondary | ICD-10-CM | POA: Diagnosis not present

## 2022-10-03 ENCOUNTER — Encounter (INDEPENDENT_AMBULATORY_CARE_PROVIDER_SITE_OTHER): Payer: Medicare PPO | Admitting: Neurology

## 2022-10-03 DIAGNOSIS — G43009 Migraine without aura, not intractable, without status migrainosus: Secondary | ICD-10-CM | POA: Diagnosis not present

## 2022-10-09 NOTE — Telephone Encounter (Signed)
Please see the MyChart message reply(ies) for my assessment and plan.    This patient gave consent for this Medical Advice Message and is aware that it may result in a bill to [employer], as well as the possibility of receiving a bill for a co-payment or deductible. They are an established patient, but are not seeking medical advice exclusively about a problem treated during an in person or video visit in the last seven days. I did not recommend an in person or video visit within seven days of my reply.    I spent a total of 10 minutes cumulative time within this mychart encounter through [employer].  Melvenia Beam, MD

## 2022-10-15 DIAGNOSIS — E782 Mixed hyperlipidemia: Secondary | ICD-10-CM | POA: Diagnosis not present

## 2022-10-15 DIAGNOSIS — E1140 Type 2 diabetes mellitus with diabetic neuropathy, unspecified: Secondary | ICD-10-CM | POA: Diagnosis not present

## 2022-10-15 DIAGNOSIS — I10 Essential (primary) hypertension: Secondary | ICD-10-CM | POA: Diagnosis not present

## 2022-10-23 DIAGNOSIS — F458 Other somatoform disorders: Secondary | ICD-10-CM | POA: Diagnosis not present

## 2022-10-23 DIAGNOSIS — H4720 Unspecified optic atrophy: Secondary | ICD-10-CM | POA: Diagnosis not present

## 2022-10-23 DIAGNOSIS — E109 Type 1 diabetes mellitus without complications: Secondary | ICD-10-CM | POA: Diagnosis not present

## 2022-10-30 DIAGNOSIS — Z1211 Encounter for screening for malignant neoplasm of colon: Secondary | ICD-10-CM | POA: Diagnosis not present

## 2022-11-20 ENCOUNTER — Encounter: Payer: Self-pay | Admitting: Neurology

## 2022-11-21 DIAGNOSIS — Z6823 Body mass index (BMI) 23.0-23.9, adult: Secondary | ICD-10-CM | POA: Diagnosis not present

## 2022-11-21 DIAGNOSIS — I700 Atherosclerosis of aorta: Secondary | ICD-10-CM | POA: Diagnosis not present

## 2022-11-21 DIAGNOSIS — R519 Headache, unspecified: Secondary | ICD-10-CM | POA: Diagnosis not present

## 2022-11-21 DIAGNOSIS — R109 Unspecified abdominal pain: Secondary | ICD-10-CM | POA: Diagnosis not present

## 2022-11-21 DIAGNOSIS — K5730 Diverticulosis of large intestine without perforation or abscess without bleeding: Secondary | ICD-10-CM | POA: Diagnosis not present

## 2022-11-21 DIAGNOSIS — Z20828 Contact with and (suspected) exposure to other viral communicable diseases: Secondary | ICD-10-CM | POA: Diagnosis not present

## 2022-11-21 DIAGNOSIS — R030 Elevated blood-pressure reading, without diagnosis of hypertension: Secondary | ICD-10-CM | POA: Diagnosis not present

## 2022-11-21 DIAGNOSIS — R110 Nausea: Secondary | ICD-10-CM | POA: Diagnosis not present

## 2022-11-21 DIAGNOSIS — Z5321 Procedure and treatment not carried out due to patient leaving prior to being seen by health care provider: Secondary | ICD-10-CM | POA: Diagnosis not present

## 2022-11-22 ENCOUNTER — Ambulatory Visit (INDEPENDENT_AMBULATORY_CARE_PROVIDER_SITE_OTHER): Payer: Medicare PPO | Admitting: Neurology

## 2022-11-22 VITALS — BP 184/109 | HR 77

## 2022-11-22 DIAGNOSIS — M316 Other giant cell arteritis: Secondary | ICD-10-CM

## 2022-11-22 DIAGNOSIS — R519 Headache, unspecified: Secondary | ICD-10-CM

## 2022-11-22 DIAGNOSIS — G43709 Chronic migraine without aura, not intractable, without status migrainosus: Secondary | ICD-10-CM

## 2022-11-22 DIAGNOSIS — G500 Trigeminal neuralgia: Secondary | ICD-10-CM

## 2022-11-22 DIAGNOSIS — M5481 Occipital neuralgia: Secondary | ICD-10-CM

## 2022-11-22 DIAGNOSIS — G43711 Chronic migraine without aura, intractable, with status migrainosus: Secondary | ICD-10-CM

## 2022-11-22 DIAGNOSIS — G43009 Migraine without aura, not intractable, without status migrainosus: Secondary | ICD-10-CM | POA: Diagnosis not present

## 2022-11-22 DIAGNOSIS — G43001 Migraine without aura, not intractable, with status migrainosus: Secondary | ICD-10-CM | POA: Diagnosis not present

## 2022-11-22 MED ORDER — LIDOCAINE HCL 2 % IJ SOLN: 3.0000 mL | Freq: Once | INTRAMUSCULAR | Status: AC

## 2022-11-22 MED ORDER — BUPIVACAINE HCL 0.5 % IJ SOLN
18.0000 mL | Freq: Once | INTRAMUSCULAR | Status: AC
Start: 2022-11-22 — End: ?

## 2022-11-22 NOTE — Progress Notes (Signed)
1410   172/110 72 1413  O2 98  HR 87 1415  185/114 HR 81  O2 98  1420  168/108        78        97 1424   177/102       70        94 1429   184/103       70        96 1434   165/97         68        97 1442   160/107       81        96 1446   172/99         69  Escorted to car with wife and grandchild.  Pt back to normal baseline per wife.  Will see next month.  No c/o pain.

## 2022-11-22 NOTE — Progress Notes (Signed)
Nerve block w/o steroid: Pt signed consent  0.5% Bupivocaine 18 mL LOT: [id number] EXP: 02/2025 NDC: 54098-119-14  2% Lidocaine 3 mL LOT: [id number] EXP: 01/2025 NDC: 30865-784-69  Witnessed by S. [facility] CMA

## 2022-11-23 DIAGNOSIS — H547 Unspecified visual loss: Secondary | ICD-10-CM | POA: Diagnosis not present

## 2022-11-23 DIAGNOSIS — E1165 Type 2 diabetes mellitus with hyperglycemia: Secondary | ICD-10-CM | POA: Diagnosis not present

## 2022-11-23 DIAGNOSIS — R634 Abnormal weight loss: Secondary | ICD-10-CM | POA: Diagnosis not present

## 2022-11-23 DIAGNOSIS — R110 Nausea: Secondary | ICD-10-CM | POA: Diagnosis not present

## 2022-11-23 DIAGNOSIS — G43909 Migraine, unspecified, not intractable, without status migrainosus: Secondary | ICD-10-CM | POA: Diagnosis not present

## 2022-11-23 DIAGNOSIS — R109 Unspecified abdominal pain: Secondary | ICD-10-CM | POA: Diagnosis not present

## 2022-11-23 DIAGNOSIS — Z6822 Body mass index (BMI) 22.0-22.9, adult: Secondary | ICD-10-CM | POA: Diagnosis not present

## 2022-11-23 DIAGNOSIS — R030 Elevated blood-pressure reading, without diagnosis of hypertension: Secondary | ICD-10-CM | POA: Diagnosis not present

## 2022-11-25 NOTE — Progress Notes (Signed)
Performed by Dr. Lucia Gaskins M.D.  All procedures a documented below were medically necessary, reasonable and appropriate based on the patient's history, medical diagnosis and physician opinion. Verbal informed consent was obtained from the patient, patient was informed of potential risk of procedure, including bruising, bleeding, hematoma formation, infection, muscle weakness, muscle pain, numbness, transient hypertension, transient hyperglycemia and transient insomnia among others. All areas injected were topically clean with isopropyl rubbing alcohol. Nonsterile nonlatex gloves were worn during the procedure.  He has 15 migraine days a month and >15 total headache days a month for last 3 months or longer. Tried: topamax, depakote, metoprolol, amitriptyline, nortriptyline, ajovy, emgality, aimovig, ubrelvy,nurtec, cymbalta, celebrex, efffexor, steroids,gabapentin,toradol,mobic,nerve blocks, verapamil, candesartan, tizanidine,trazodone. The only medication that has given him some relief is qulipta. Try Vyepti.    30-gauge needle was used. All procedures a documented blood were medically necessary, reasonable and appropriate based on the patient's history, medical diagnosis and physician opinion. Verbal informed consent was obtained from the patient, patient was informed of potential risk of procedure, including bruising, bleeding, hematoma formation, infection, muscle weakness, muscle pain, numbness, transient hypertension, transient hyperglycemia and transient insomnia among others. All areas injected were topically clean with isopropyl rubbing alcohol. Nonsterile nonlatex gloves were worn during the procedure.  1. Greater occipital nerve block [phone]). The greater occipital nerve site was identified at the nuchal line medial to the occipital artery. Medication was injected into the left occipital nerve areas and suboccipital areas. Patient's condition is associated with inflammation of the greater occipital nerve  and associated multiple groups. Injection was deemed medically necessary, reasonable and appropriate. Injection represents a separate and unique surgical service.  2. Supraorbital nerve block (64400): Supraorbital nerve site was identified along the incision of the frontal bone on the orbital/supraorbital ridge. Medication was injected into the right supraorbital nerve areas. Patient's condition is associated with inflammation of the supraorbital and associated muscle groups. Injection was deemed medically necessary, reasonable and appropriate. Injection represents a separate and unique surgical service.

## 2022-12-15 DIAGNOSIS — E1169 Type 2 diabetes mellitus with other specified complication: Secondary | ICD-10-CM | POA: Diagnosis not present

## 2022-12-15 DIAGNOSIS — E782 Mixed hyperlipidemia: Secondary | ICD-10-CM | POA: Diagnosis not present

## 2022-12-15 DIAGNOSIS — F32A Depression, unspecified: Secondary | ICD-10-CM | POA: Diagnosis not present

## 2022-12-18 DIAGNOSIS — J019 Acute sinusitis, unspecified: Secondary | ICD-10-CM | POA: Diagnosis not present

## 2022-12-18 DIAGNOSIS — Z20828 Contact with and (suspected) exposure to other viral communicable diseases: Secondary | ICD-10-CM | POA: Diagnosis not present

## 2022-12-18 DIAGNOSIS — Z6823 Body mass index (BMI) 23.0-23.9, adult: Secondary | ICD-10-CM | POA: Diagnosis not present

## 2022-12-18 DIAGNOSIS — R030 Elevated blood-pressure reading, without diagnosis of hypertension: Secondary | ICD-10-CM | POA: Diagnosis not present

## 2022-12-20 DIAGNOSIS — R110 Nausea: Secondary | ICD-10-CM | POA: Diagnosis not present

## 2022-12-20 DIAGNOSIS — Z6823 Body mass index (BMI) 23.0-23.9, adult: Secondary | ICD-10-CM | POA: Diagnosis not present

## 2022-12-20 DIAGNOSIS — R030 Elevated blood-pressure reading, without diagnosis of hypertension: Secondary | ICD-10-CM | POA: Diagnosis not present

## 2022-12-20 DIAGNOSIS — J329 Chronic sinusitis, unspecified: Secondary | ICD-10-CM | POA: Diagnosis not present

## 2023-01-08 DIAGNOSIS — G43009 Migraine without aura, not intractable, without status migrainosus: Secondary | ICD-10-CM | POA: Diagnosis not present

## 2023-01-23 ENCOUNTER — Encounter: Payer: Self-pay | Admitting: Neurology

## 2023-01-23 NOTE — Telephone Encounter (Signed)
Appt for 7/10 nerve block has been canceled and I alerted [facility] of his positive response to Vyepti so far and to cancel the Depacon infusion for 7/10.

## 2023-01-24 ENCOUNTER — Ambulatory Visit: Payer: Medicare PPO | Admitting: Neurology

## 2023-02-03 ENCOUNTER — Other Ambulatory Visit (HOSPITAL_BASED_OUTPATIENT_CLINIC_OR_DEPARTMENT_OTHER): Payer: Self-pay

## 2023-03-26 ENCOUNTER — Encounter: Payer: Self-pay | Admitting: Neurology

## 2023-03-26 NOTE — Telephone Encounter (Signed)
Message sent to [facility] w/ [facility]

## 2023-03-31 ENCOUNTER — Emergency Department (HOSPITAL_COMMUNITY)
Admission: EM | Admit: 2023-03-31 | Discharge: 2023-03-31 | Disposition: A | Discharge status: Home / Self Care | Payer: Medicare PPO | Attending: Emergency Medicine | Admitting: Emergency Medicine

## 2023-03-31 ENCOUNTER — Emergency Department (HOSPITAL_COMMUNITY): Payer: Medicare PPO

## 2023-03-31 ENCOUNTER — Other Ambulatory Visit: Payer: Self-pay

## 2023-03-31 ENCOUNTER — Encounter (HOSPITAL_COMMUNITY): Payer: Self-pay

## 2023-03-31 DIAGNOSIS — R030 Elevated blood-pressure reading, without diagnosis of hypertension: Secondary | ICD-10-CM | POA: Diagnosis not present

## 2023-03-31 DIAGNOSIS — Y9389 Activity, other specified: Secondary | ICD-10-CM | POA: Diagnosis not present

## 2023-03-31 DIAGNOSIS — R9431 Abnormal electrocardiogram [ECG] [EKG]: Secondary | ICD-10-CM | POA: Diagnosis not present

## 2023-03-31 DIAGNOSIS — M79671 Pain in right foot: Secondary | ICD-10-CM | POA: Diagnosis not present

## 2023-03-31 DIAGNOSIS — Z6823 Body mass index (BMI) 23.0-23.9, adult: Secondary | ICD-10-CM | POA: Diagnosis not present

## 2023-03-31 DIAGNOSIS — M7989 Other specified soft tissue disorders: Secondary | ICD-10-CM | POA: Diagnosis not present

## 2023-03-31 DIAGNOSIS — Z794 Long term (current) use of insulin: Secondary | ICD-10-CM | POA: Diagnosis not present

## 2023-03-31 DIAGNOSIS — S92511A Displaced fracture of proximal phalanx of right lesser toe(s), initial encounter for closed fracture: Secondary | ICD-10-CM | POA: Diagnosis not present

## 2023-03-31 DIAGNOSIS — R55 Syncope and collapse: Secondary | ICD-10-CM | POA: Diagnosis not present

## 2023-03-31 DIAGNOSIS — L089 Local infection of the skin and subcutaneous tissue, unspecified: Secondary | ICD-10-CM | POA: Diagnosis not present

## 2023-03-31 DIAGNOSIS — S99201A Unspecified physeal fracture of phalanx of right toe, initial encounter for closed fracture: Secondary | ICD-10-CM | POA: Insufficient documentation

## 2023-03-31 DIAGNOSIS — X501XXA Overexertion from prolonged static or awkward postures, initial encounter: Secondary | ICD-10-CM | POA: Insufficient documentation

## 2023-03-31 DIAGNOSIS — G43709 Chronic migraine without aura, not intractable, without status migrainosus: Secondary | ICD-10-CM | POA: Diagnosis not present

## 2023-03-31 DIAGNOSIS — Z7984 Long term (current) use of oral hypoglycemic drugs: Secondary | ICD-10-CM | POA: Insufficient documentation

## 2023-03-31 DIAGNOSIS — E1140 Type 2 diabetes mellitus with diabetic neuropathy, unspecified: Secondary | ICD-10-CM | POA: Diagnosis not present

## 2023-03-31 DIAGNOSIS — M25521 Pain in right elbow: Secondary | ICD-10-CM | POA: Diagnosis not present

## 2023-03-31 DIAGNOSIS — S93491A Sprain of other ligament of right ankle, initial encounter: Secondary | ICD-10-CM | POA: Diagnosis not present

## 2023-03-31 DIAGNOSIS — S92521A Displaced fracture of medial phalanx of right lesser toe(s), initial encounter for closed fracture: Secondary | ICD-10-CM | POA: Diagnosis not present

## 2023-03-31 DIAGNOSIS — W19XXXA Unspecified fall, initial encounter: Secondary | ICD-10-CM | POA: Insufficient documentation

## 2023-03-31 DIAGNOSIS — S99921A Unspecified injury of right foot, initial encounter: Secondary | ICD-10-CM | POA: Diagnosis present

## 2023-03-31 DIAGNOSIS — E11621 Type 2 diabetes mellitus with foot ulcer: Secondary | ICD-10-CM | POA: Diagnosis not present

## 2023-03-31 DIAGNOSIS — S93401A Sprain of unspecified ligament of right ankle, initial encounter: Secondary | ICD-10-CM | POA: Diagnosis not present

## 2023-03-31 DIAGNOSIS — N3001 Acute cystitis with hematuria: Secondary | ICD-10-CM | POA: Diagnosis not present

## 2023-03-31 DIAGNOSIS — M25571 Pain in right ankle and joints of right foot: Secondary | ICD-10-CM | POA: Diagnosis not present

## 2023-03-31 DIAGNOSIS — L97509 Non-pressure chronic ulcer of other part of unspecified foot with unspecified severity: Secondary | ICD-10-CM | POA: Diagnosis not present

## 2023-03-31 DIAGNOSIS — E119 Type 2 diabetes mellitus without complications: Secondary | ICD-10-CM | POA: Diagnosis not present

## 2023-03-31 LAB — URINALYSIS, ROUTINE W REFLEX MICROSCOPIC
Bilirubin Urine: NEGATIVE
Glucose, UA: >=500 mg/dL — AB
Ketones, ur: NEGATIVE mg/dL
Nitrite: POSITIVE — AB
Protein, ur: NEGATIVE mg/dL
Specific Gravity, Urine: >1.03 — ABNORMAL HIGH (ref 1.005–1.030)
pH: 6 (ref 5.0–8.0)

## 2023-03-31 LAB — URINALYSIS, MICROSCOPIC (REFLEX): WBC, UA: >50 WBC/hpf (ref 0–5)

## 2023-03-31 LAB — COMPREHENSIVE METABOLIC PANEL
ALT: 29 U/L (ref 0–44)
AST: 23 U/L (ref 15–41)
Albumin: 3.8 g/dL (ref 3.5–5.0)
Alkaline Phosphatase: 71 U/L (ref 38–126)
Anion gap: 14 (ref 5–15)
BUN: 12 mg/dL (ref 8–23)
CO2: 28 mmol/L (ref 22–32)
Calcium: 9.7 mg/dL (ref 8.9–10.3)
Chloride: 98 mmol/L (ref 98–111)
Creatinine, Ser: 1.06 mg/dL (ref 0.61–1.24)
GFR, Estimated: >60 mL/min (ref >60)
Glucose, Bld: 227 mg/dL — ABNORMAL HIGH (ref 70–99)
Potassium: 4.6 mmol/L (ref 3.5–5.1)
Sodium: 140 mmol/L (ref 135–145)
Total Bilirubin: 0.8 mg/dL (ref 0.3–1.2)
Total Protein: 6.7 g/dL (ref 6.5–8.1)

## 2023-03-31 LAB — I-STAT CG4 LACTIC ACID, ED: Lactic Acid, Venous: 0.9 mmol/L (ref 0.5–1.9)

## 2023-03-31 LAB — CBC
HCT: 45.9 % (ref 39.0–52.0)
Hemoglobin: 15.4 g/dL (ref 13.0–17.0)
MCH: 30.7 pg (ref 26.0–34.0)
MCHC: 33.6 g/dL (ref 30.0–36.0)
MCV: 91.4 fL (ref 80.0–100.0)
Platelets: 327 10*3/uL (ref 150–400)
RBC: 5.02 MIL/uL (ref 4.22–5.81)
RDW: 12.4 % (ref 11.5–15.5)
WBC: 8.8 10*3/uL (ref 4.0–10.5)
nRBC: 0 % (ref 0.0–0.2)

## 2023-03-31 LAB — CBG MONITORING, ED: Glucose-Capillary: 217 mg/dL — ABNORMAL HIGH (ref 70–99)

## 2023-03-31 MED ORDER — CEPHALEXIN 250 MG PO CAPS
500.0000 mg | ORAL_CAPSULE | Freq: Once | ORAL | Status: AC
  Administered 2023-03-31: 500 mg via ORAL
  Filled 2023-03-31: qty 2

## 2023-03-31 MED ORDER — CEPHALEXIN 500 MG PO CAPS: 500.0000 mg | ORAL_CAPSULE | Freq: Four times a day (QID) | ORAL | 0 refills | Status: DC

## 2023-03-31 NOTE — ED Provider Notes (Signed)
[place] EMERGENCY DEPARTMENT AT [facility] Provider Note   CSN: [id number] Arrival date & time: 03/31/23  1356     History  Chief Complaint  Patient presents with   Loss of Consciousness   Ankle Pain   Wound Infection    Kyle [name] is a 67 y.o. male.  Patient is a 67 year old male with a history of diabetes, chronic migraines, MSSA, chronic pain with a spinal stimulator who is presenting today from his doctor's office due to concern for infection in his right fourth toe.  Patient reports last Saturday 7 days prior to arrival he was carrying trash out and somehow fell landing on a wood ramp and woke up on the ground.  He is not sure if he passed out or tripped.  He is assuming he passed out because he does not remember what happened.  He denies feeling dizzy, palpitations, chest pain.  He has chronic headaches but head has not been any more uncomfortable and he has not had any bruises or bumps on his head.  When he fell he injured his toe but also twisted his right ankle.  Because he was still having pain in his ankle he decided to go see his doctor today and have his ankle checked out.  When the doctor saw his toe he became very concerned that maybe he had osteomyelitis and wanted him to go for further evaluation.  Patient denies any systemic symptoms over the last week such as fever, chills, weakness, nausea or vomiting.  He has had no other episodes of syncope or feeling like he might pass out.  He reports his sugar is very uncontrolled and goes up and down a lot but short of that there has been nothing new.  The history is provided by the patient, the spouse and medical records.  Loss of Consciousness Ankle Pain      Home Medications Prior to Admission medications   Medication Sig Start Date End Date Taking? Authorizing Provider  ACTEMRA ACTPEN 162 MG/0.9ML SOAJ Inject into the skin once a week. 08/12/19  Yes [provider]  Atogepant (QULIPTA) 60 MG  TABS Take 60 mg by mouth daily. 05/09/22  Yes Anson Fret, MD  buPROPion (WELLBUTRIN XL) 150 MG 24 hr tablet Take 450 mg by mouth at bedtime.   Yes [provider]  Calcium Carbonate Antacid (TUMS PO) Take by mouth as needed.   Yes [provider]  cephALEXin (KEFLEX) 500 MG capsule Take 1 capsule (500 mg total) by mouth 4 (four) times daily. 03/31/23  Yes Topher Buenaventura, Alphonzo Lemmings, MD  DULoxetine (CYMBALTA) 20 MG capsule Take 20 mg by mouth at bedtime.   Yes [provider]  gabapentin (NEURONTIN) 300 MG capsule Take 2 capsules (600 mg total) by mouth 3 (three) times daily. 03/29/19  Yes Anson Fret, MD  glipiZIDE (GLUCOTROL XL) 10 MG 24 hr tablet Take 10 mg by mouth 2 (two) times daily. 07/16/18  Yes [provider]  insulin glargine (LANTUS) 100 UNIT/ML injection Inject 20 Units into the skin 2 (two) times daily.   Yes [provider]  naloxone (NARCAN) 4 MG/0.1ML LIQD nasal spray kit Place into the nose. 08/02/19  Yes [provider]  oxyCODONE-acetaminophen (PERCOCET/ROXICET) 5-325 MG tablet Take 2 tablets by mouth 3 (three) times daily as needed.    Yes [provider]  tiZANidine (ZANAFLEX) 2 MG tablet Take 2 mg by mouth 2 (two) times a day.   Yes [provider]  trazodone (DESYREL) 300 MG tablet Take 300 mg by mouth at bedtime.   Yes [provider]  BD INSULIN SYRINGE U/F 31G X 5/16" 1 ML MISC  12/30/19   [provider]  Eptinezumab-jjmr (VYEPTI IV) Inject 300 mg into the vein every 3 (three) months. Ordered but not started yet.    Anson Fret, MD      Allergies    Aspirin and Ibuprofen    Review of Systems   Review of Systems  Cardiovascular:  Positive for syncope.    Physical Exam Updated Vital Signs BP 139/85   Pulse (!) 58   Temp 98.5 F (36.9 C) (Oral)   Resp 19   Ht 6\' 1"  (1.854 m)   Wt 80.7 kg   SpO2 100%   BMI 23.48 kg/m  Physical Exam Vitals and nursing note reviewed.   Constitutional:      General: He is not in acute distress. HENT:     Head: Normocephalic and atraumatic.  Eyes:     Pupils: Pupils are equal, round, and reactive to light.  Cardiovascular:     Rate and Rhythm: Normal rate.     Pulses: Normal pulses.     Comments: 2+ DP pulse in bilateral lower extremities Pulmonary:     Effort: Pulmonary effort is normal.     Breath sounds: Normal breath sounds. No wheezing.  Abdominal:     General: Abdomen is flat.     Palpations: Abdomen is soft.  Musculoskeletal:     Right ankle: Swelling present. Tenderness present over the lateral malleolus and base of 5th metatarsal. No proximal fibula tenderness. Normal range of motion. Normal pulse.       Feet:     Comments: Audible skin tears noted over the upper extremities that are bandaged and healing ecchymosis and area stages of healing  Neurological:     Mental Status: He is alert. Mental status is at baseline.  Psychiatric:        Mood and Affect: Mood normal.     ED Results / Procedures / Treatments   Labs (all labs ordered are listed, but only abnormal results are displayed) Labs Reviewed  URINALYSIS, ROUTINE W REFLEX MICROSCOPIC - Abnormal; Notable for the following components:      Result Value   APPearance TURBID (*)    Specific Gravity, Urine >1.030 (*)    Glucose, UA >=500 (*)    Hgb urine dipstick SMALL (*)    Nitrite POSITIVE (*)    Leukocytes,Ua MODERATE (*)    All other components within normal limits  COMPREHENSIVE METABOLIC PANEL - Abnormal; Notable for the following components:   Glucose, Bld 227 (*)    All other components within normal limits  URINALYSIS, MICROSCOPIC (REFLEX) - Abnormal; Notable for the following components:   Bacteria, UA RARE (*)    Non Squamous Epithelial PRESENT (*)    All other components within normal limits  CBG MONITORING, ED - Abnormal; Notable for the following components:   Glucose-Capillary 217 (*)    All other components within normal  limits  URINE CULTURE  CBC  I-STAT CG4 LACTIC ACID, ED  I-STAT CG4 LACTIC ACID, ED    EKG EKG Interpretation Date/Time:  Saturday March 31 2023 15:11:59 EDT Ventricular Rate:  90 PR Interval:  158 QRS Duration:  96 QT Interval:  368 QTC Calculation: 450 R Axis:   72  Text Interpretation: Normal sinus rhythm Incomplete right bundle branch block No significant change since  last tracing When compared with ECG of 03-May-2020 11:34, PREVIOUS ECG IS PRESENT Confirmed by Gwyneth Sprout ([id number]) on 03/31/2023 3:28:29 PM  Radiology DG Foot Complete Right  Result Date: 03/31/2023 CLINICAL DATA:  Syncopal episode 1 week ago. Patient with right ankle, foot and elbow pain. EXAM: RIGHT FOOT COMPLETE - 3+ VIEW COMPARISON:  None Available. FINDINGS: Comminuted fracture of the middle phalanx of the fourth toe, minimally displaced. There is surrounding soft tissue swelling. No other fractures.  No bone lesions. Joints are normally aligned. Mild forefoot soft tissue swelling. IMPRESSION: 1. Comminuted fracture of the distal aspect the middle phalanx of the fourth toe, not fully defined on these exams, suspected to have an intra-articular component at the DIP joint. No significant fracture displacement. 2. No other fractures.  No dislocation. Electronically Signed   By: Amie Portland M.D.   On: 03/31/2023 15:47   DG Elbow Complete Right  Result Date: 03/31/2023 CLINICAL DATA:  Syncopal episode 1 week ago. Patient with right ankle, foot and elbow pain. EXAM: RIGHT ELBOW - COMPLETE 3+ VIEW COMPARISON:  None Available. FINDINGS: There is no evidence of fracture, dislocation, or joint effusion. There is no evidence of arthropathy or other focal bone abnormality. Soft tissues are unremarkable. IMPRESSION: Negative. Electronically Signed   By: Amie Portland M.D.   On: 03/31/2023 15:45   DG Ankle Complete Right  Result Date: 03/31/2023 CLINICAL DATA:  Syncopal episode 1 week ago. Patient with right ankle,  foot and elbow pain. EXAM: RIGHT ANKLE - COMPLETE 3+ VIEW COMPARISON:  None Available. FINDINGS: No fracture or bone lesion. Ankle joint normally spaced and aligned. Nonspecific surrounding soft tissue swelling. IMPRESSION: No fracture or dislocation. Electronically Signed   By: Amie Portland M.D.   On: 03/31/2023 15:44    Procedures Procedures    Medications Ordered in ED Medications  cephALEXin (KEFLEX) capsule 500 mg (500 mg Oral Given 03/31/23 1650)    ED Course/ Medical Decision Making/ A&P                                 Medical Decision Making Amount and/or Complexity of Data Reviewed Independent Historian: spouse External Data Reviewed: notes. Labs: ordered. Decision-making details documented in ED Course. Radiology: ordered and independent interpretation performed. Decision-making details documented in ED Course. ECG/medicine tests: ordered and independent interpretation performed. Decision-making details documented in ED Course.  Risk Prescription drug management.  Pt with multiple medical problems and comorbidities and presenting today with a complaint that caries a high risk for morbidity and mortality.  Here today with a persistent wound on the right fourth toe who was sent here for further evaluation due to concern for osteomyelitis of the toe.  Patient does have a wound present, no wounds between the toes or at the bottom of the foot.  No significant spreading redness on the dorsum of the foot.  Concern for cellulitis and diabetic foot wound.  Plain films to evaluate for signs of osteomyelitis.  Patient reports all this occurred because of a fall 1 week ago.  Possible syncopal event that caused his fall as he cannot remember the cause of the fall but remembers waking up on the ground.  He has not had any further episodes.  Denies prior syncope.  Has been otherwise doing well at home.  No new medications.  Eating and drinking normally and low suspicion for dehydration at this  time.  I independently interpreted patient's  EKG and labs.  EKG today with out evidence of prolonged QT, dysrhythmia, Brugada's.  CBC with stable hemoglobin, normal white count, CMP within normal limits other than blood sugar of 227 and lactate is normal without evidence of abscess at this time.  UA does show small hemoglobin positive nitrites, leukocytes and greater than 50 white blood cells but rare bacteria.  Will discuss with patient if he is having any urinary symptoms.  I have independently visualized and interpreted pt's images today.  Ankle images negative for fracture today, foot film shows concern for a fracture to right fourth toe.  Radiology reports comminuted fracture of the middle phalanx of the fourth toe with suspected intra-articular component.  This may also explain the significant swelling and the redness that he has to the toe.  No evidence of open fracture at this time.  Elbow image negative for fracture.  Given patient's fracture at this site feel that that would explain some of the swelling and redness.  No evidence of bone deterioration or osteo at this time.  Feel at this time it would be reasonable to have patient start oral antibiotics and have close follow-up with wound care.  Patient was placed in a postop shoe.  Urine also appears infected and patient denies having any burning, frequency or urgency but has noticed intermittently that his urine smelled different.  Will cover with Keflex which should cover wound as well as UTI.  Will have patient follow-up with wound care.  He was placed in a postop shoe and also placed in an Ace bandage for his ankle.        Final Clinical Impression(s) / ED Diagnoses Final diagnoses:  Sprain of right ankle, unspecified ligament, initial encounter  Closed physeal fracture of phalanx of lesser toe of right foot, unspecified phalanx, unspecified physeal fracture configuration, initial encounter  Wound infection  Acute cystitis with hematuria     Rx / DC Orders ED Discharge Orders          Ordered    cephALEXin (KEFLEX) 500 MG capsule  4 times daily        03/31/23 1651              Gwyneth Sprout, MD 03/31/23 1656

## 2023-03-31 NOTE — Discharge Instructions (Addendum)
You broke your toe.  You will need to start antibiotics so the toe does not get infected and will heal.  You also need to call wound care on Monday so they can get you in and make sure things are improving.  Your ankle is sprained but there is nothing broken in your ankle.  Your lab work looks great today except your urine looks like you might have an infection starting so the antibiotic for your toe will also cover the urinary tract infection.  Could be a good idea to also follow-up with your doctor next week so they can follow-up on the cultures of your urine and make sure you are doing okay.  Wear the postop shoe when you are up moving around.  Change the bandage on your toe daily but it is okay to leave it open to the air on occasion as well.  No signs of osteomyelitis at this time.

## 2023-03-31 NOTE — ED Notes (Signed)
Pt has spinal stimulator.

## 2023-03-31 NOTE — ED Triage Notes (Signed)
Pt arrives via POV. Last Saturday, the patient was taking the trash out and had a syncopal episode. Pt unsure if he hit his head, no blood thinners. Patient's primary complaint is that he somehow he also injured his right fourth. Pt is a diabetic and the wound is not healing well. States his PCP was concerned he may have osteomyelitis. He also has pain and swelling to right ankle, an abrasion to right elbow. Pt is AxOx4.

## 2023-03-31 NOTE — ED Notes (Signed)
Spoke with RN about pt's repeat lactic. RN stated it was not needed at this time.

## 2023-04-02 DIAGNOSIS — G43009 Migraine without aura, not intractable, without status migrainosus: Secondary | ICD-10-CM | POA: Diagnosis not present

## 2023-04-02 LAB — URINE CULTURE: Culture: >=100000 — AB

## 2023-04-03 ENCOUNTER — Telehealth (HOSPITAL_BASED_OUTPATIENT_CLINIC_OR_DEPARTMENT_OTHER): Payer: Self-pay | Admitting: *Deleted

## 2023-04-03 NOTE — Telephone Encounter (Signed)
Post ED Visit - Positive Culture Follow-up  Culture report reviewed by antimicrobial stewardship pharmacist: [facility] []  Enzo Bi, Pharm.D. []  Celedonio Miyamoto, Pharm.D., BCPS AQ-ID []  Garvin Fila, Pharm.D., BCPS []  Georgina Pillion, [address].D., BCPS []  [place], [place].D., BCPS, AAHIVP []  Estella Husk, Pharm.D., BCPS, AAHIVP []  Lysle Pearl, PharmD, BCPS []  Phillips Climes, PharmD, BCPS []  Agapito Games, PharmD, BCPS []  Verlan Friends, PharmD []  Mervyn Gay, PharmD, BCPS []  Vinnie Level, PharmD  [facility] []  Len Childs, PharmD []  Greer Pickerel, PharmD []  Adalberto Cole, PharmD []  Perlie Gold, Rph []  Lonell Face) Jean Rosenthal, PharmD []  Earl Many, PharmD []  Junita Push, PharmD []  Dorna Leitz, PharmD []  Terrilee Files, PharmD []  Lynann Beaver, PharmD []  Keturah Barre, PharmD []  Loralee Pacas, PharmD []  Bernadene Person, PharmD   Positive urine culture Treated with Cephalexin.  Spoke with pt who state no UTI symptoms.  Currently taking Cephalexin as prescribed.  Advised to complete abx treatment,  and no further patient follow-up is required at this time.  Virl Axe [facility] 04/03/2023, 10:28 AM

## 2023-04-10 ENCOUNTER — Encounter (HOSPITAL_BASED_OUTPATIENT_CLINIC_OR_DEPARTMENT_OTHER): Payer: Medicare PPO | Attending: Internal Medicine | Admitting: General Surgery

## 2023-04-10 DIAGNOSIS — I872 Venous insufficiency (chronic) (peripheral): Secondary | ICD-10-CM | POA: Diagnosis not present

## 2023-04-10 DIAGNOSIS — E11622 Type 2 diabetes mellitus with other skin ulcer: Secondary | ICD-10-CM | POA: Insufficient documentation

## 2023-04-10 DIAGNOSIS — I10 Essential (primary) hypertension: Secondary | ICD-10-CM | POA: Insufficient documentation

## 2023-04-10 DIAGNOSIS — L97516 Non-pressure chronic ulcer of other part of right foot with bone involvement without evidence of necrosis: Secondary | ICD-10-CM | POA: Diagnosis not present

## 2023-04-10 DIAGNOSIS — L97512 Non-pressure chronic ulcer of other part of right foot with fat layer exposed: Secondary | ICD-10-CM | POA: Diagnosis not present

## 2023-04-10 DIAGNOSIS — E11621 Type 2 diabetes mellitus with foot ulcer: Secondary | ICD-10-CM | POA: Insufficient documentation

## 2023-04-10 NOTE — Progress Notes (Signed)
AKWASI, KUPERUS [name] ([id number]) [id number] Page 1 of 4 Visit Report for 04/10/2023 Abuse Risk Screen Details Patient Name: Date of Service: Kyle [name]. 04/10/2023 12:45 PM Medical Record Number: [id number] Patient Account Number: [id number] Date of Birth/Sex: Treating RN: Dec 07, 1955 (67 y.o. [name] Primary Care Rayvin Abid: Fara Chute Other Clinician: Referring Maria Coin: Treating Savannah Morford/Extender: Garret Reddish in Treatment: 0 Abuse Risk Screen Items Answer ABUSE RISK SCREEN: Has anyone close to you tried to hurt or harm you recentlyo No Do you feel uncomfortable with anyone in your familyo No Has anyone forced you do things that you didnt want to doo No Electronic Signature(s) Signed: 04/10/2023 2:18:50 PM By: Samuella Bruin Entered By: Samuella Bruin on 04/10/2023 12:40:53 -------------------------------------------------------------------------------- Activities of Daily Living Details Patient Name: Date of Service: Kyle [name]. 04/10/2023 12:45 PM Medical Record Number: [id number] Patient Account Number: [id number] Date of Birth/Sex: Treating RN: 1955/09/28 (67 y.o. [name] Primary Care Electra Paladino: Fara Chute Other Clinician: Referring Albertia Carvin: Treating Ellington Greenslade/Extender: Garret Reddish in Treatment: 0 Activities of Daily Living Items Answer Activities of Daily Living (Please select one for each item) Drive Automobile Completely Able T Medications ake Completely Able Use T elephone Completely Able Care for Appearance Completely Able Use T oilet Completely Able Bath / Shower Completely Able Dress Self Completely Able Feed Self Completely Able Walk Completely Able Get In / Out Bed Completely Able Housework Completely Able Prepare Meals Completely Able Handle Money Completely Able Shop for Self Completely Able Electronic  Signature(s) Signed: 04/10/2023 2:18:50 PM By: Samuella Bruin Entered By: Samuella Bruin on 04/10/2023 12:41:07 Audie Clear [name] ([id number]) [id number] Page 2 of 4 -------------------------------------------------------------------------------- Education Screening Details Patient Name: Date of Service: Kyle [name]. 04/10/2023 12:45 PM Medical Record Number: [id number] Patient Account Number: [id number] Date of Birth/Sex: Treating RN: 10/27/1955 (67 y.o. 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 Impaired Hearing: No Decreased Hand dexterity: No Knowledge/Comprehension Knowledge Level: Medium Comprehension Level: Medium Ability to understand written instructions: Medium Ability to understand verbal instructions: Medium Motivation Anxiety Level: Calm Cooperation: Cooperative Education Importance: Acknowledges Need Interest in Health Problems: Asks Questions Perception: Coherent Willingness to Engage in Self-Management Medium Activities: Readiness to Engage in Self-Management Medium Activities: Electronic Signature(s) Signed: 04/10/2023 2:18:50 PM By: Samuella Bruin Entered By: Samuella Bruin on 04/10/2023 12:41:33 -------------------------------------------------------------------------------- Fall Risk Assessment Details Patient Name: Date of  Service: Kyle Face MA S [name]. 04/10/2023 12:45 PM Medical Record Number: [id number] Patient Account Number: [id number] Date of Birth/Sex: Treating RN: [birth year] (67 y.o. [name] Primary Care Riggin Cuttino: Fara Chute Other Clinician: Referring Armany Mano: Treating Teigen Bellin/Extender: Garret Reddish in Treatment: 0 Fall Risk Assessment Items Have you had 2 or more falls in the last [address] EATHAN, VENIS [name] ([id number]) [id number] Page 3 of 4 Have you had any fall that resulted in injury in the last 12 monthso 0 Yes FALLS RISK SCREEN History of falling - immediate or within 3 months 25 Yes Secondary diagnosis (Do you have 2 or  more medical diagnoseso) 15 Yes Ambulatory aid None/bed rest/wheelchair/nurse 0 No Crutches/cane/walker 15 Yes Furniture 0 No Intravenous therapy Access/Saline/Heparin Lock 0 No Gait/Transferring Normal/ bed rest/ wheelchair 0 Yes Weak (short steps with or without shuffle, stooped but able to lift head while walking, may seek 10 Yes support from furniture) Impaired (short steps with shuffle, may have difficulty arising from chair, head down, impaired 0 No balance) Mental Status Oriented to own ability 0 Yes Electronic Signature(s) Signed: 04/10/2023 2:18:50 PM By: Samuella Bruin Entered By: Samuella Bruin on 04/10/2023 12:41:52 -------------------------------------------------------------------------------- Foot Assessment Details Patient Name: Date of Service: Kyle Face MA S [name]. 04/10/2023 12:45 PM Medical Record Number: [id number] Patient Account Number: [id number] Date of Birth/Sex: Treating RN: Aug 03, 1955 (67 y.o. [name] Primary Care Nishita Isaacks: Fara Chute Other Clinician: Referring Hence Derrick: Treating Arbor Leer/Extender: Garret Reddish in Treatment: 0 Foot Assessment Items Site Locations + = Sensation present, - = Sensation absent, C  = Callus, U = Ulcer R = Redness, W = Warmth, M = Maceration, PU = Pre-ulcerative lesion F = Fissure, S = Swelling, D = Dryness Assessment Right: Left: Other Deformity: No No Prior Foot Ulcer: No No Prior Amputation: No No Charcot Joint: No No Ambulatory Status: Ambulatory With Help Assistance Device: BLANCA, CAPERTON [name] ([id number]) [phone] [id number] Page 4 of 4 Gait: Steady Electronic Signature(s) Signed: 04/10/2023 2:18:50 PM By: Samuella Bruin Entered By: Samuella Bruin on 04/10/2023 12:47:04 -------------------------------------------------------------------------------- Nutrition Risk Screening Details Patient Name: Date of Service: Kyle Face MA S [name]. 04/10/2023 12:45 PM Medical Record Number: [id number] Patient Account Number: [id number] Date of Birth/Sex: Treating RN: Aug 21, 1955 (67 y.o. [name] Primary Care Jordyn Doane: Fara Chute Other Clinician: Referring [place] Lusty: Treating Teng Decou/Extender: Garret Reddish in Treatment: 0 Height (in): 73 Weight (lbs): 175 Body Mass Index (BMI): 23.1 Nutrition Risk Screening Items Score Screening NUTRITION RISK SCREEN: I have an illness or condition that made me change the kind and/or amount of food I eat 0 No I eat fewer than two meals per day 0 No I eat few fruits and vegetables, or milk products 0 No I have three or more drinks of beer, liquor or wine almost every day 0 No I have tooth or mouth problems that make it hard for me to eat 0 No I don't always have enough money to buy the food I need 0 No I eat alone most of the time 0 No I take three or more different prescribed or over-the-counter drugs [name] day 1 Yes Without wanting to, I have lost or gained 10 pounds in the last six months 0 No I am not always physically able to shop, cook and/or feed myself 0 No Nutrition Protocols Good Risk Protocol 0 No interventions needed Moderate Risk Protocol High Risk  Proctocol Risk Level: Good Risk Score: 1 Electronic Signature(s) Signed: 04/10/2023 2:18:50 PM By: Samuella Bruin Entered By: Samuella Bruin on 04/10/2023 12:42:16

## 2023-04-10 NOTE — Progress Notes (Signed)
topical ointment N/A N/A Pain Control: Subcutaneous, Slough N/A N/A Tissue Debrided: Skin/Subcutaneous Tissue N/A N/A Level: 0.22 N/A N/A Debridement A (sq cm): rea Curette N/A N/A Instrument: Minimum N/A N/A Bleeding: Pressure N/A N/A Hemostasis A chieved: Procedure was tolerated well N/A N/A Debridement Treatment Response: 0.2x1.4x0.3 N/A N/A Post Debridement Measurements L x W x D (cm) 0.066 N/A N/A Post Debridement Volume: (cm) No Abnormalities Noted N/A N/A Periwound Skin Texture: Maceration: Yes N/A N/A Periwound Skin Moisture: Rubor: Yes N/A N/A Periwound Skin Color: No Abnormality N/A N/A Temperature: Debridement N/A N/A Procedures Performed: Treatment Notes Electronic Signature(s) Signed: 04/10/2023 1:12:31 PM By: Duanne Guess MD FACS Entered By: Duanne Guess  on 04/10/2023 10:12:31 -------------------------------------------------------------------------------- Multi-Disciplinary Care Plan Details Patient Name: Date of Service: [name] MA S A. 04/10/2023 12:45 PM Medical Record Number: [id number] Patient Account Number: [id number] Date of Birth/Sex: Treating RN: 1955-09-27 (67 y.o. [name] Primary Care Damoney Julia: Fara Chute Other Clinician: Referring Kole Hilyard: Treating Saleah Rishel/Extender: Garret Reddish in Treatment: 0 Active Inactive Abuse / Safety / Falls / Self Care Management Nursing Diagnoses: Impaired physical mobility Knowledge deficit related to: safety; personal, health (wound), emergency Potential for falls Goals: Patient/caregiver will verbalize/demonstrate measures taken to improve the patient's personal safety Date Initiated: 04/10/2023 Target Resolution Date: 05/25/2023 Goal Status: Active Patient/caregiver will verbalize/demonstrate measures taken to prevent injury and/or falls Date Initiated: 04/10/2023 Target Resolution Date: 05/25/2023 Goal Status: Active Interventions: Assess fall risk on admission and as needed Assess impairment of mobility on admission and as needed per policy Notes: Wound/Skin Impairment Nursing Diagnoses: Impaired tissue integrity Knowledge deficit related to ulceration/compromised skin integrity Goals: Patient/caregiver will verbalize understanding of skin care regimen ISANDRO, SCHORER A ([id number]) [phone].pdf Page 6 of 9 Date Initiated: 04/10/2023 Target Resolution Date: 05/25/2023 Goal Status: Active Interventions: Assess patient/caregiver ability to obtain necessary supplies Assess patient/caregiver ability to perform ulcer/skin care regimen upon admission and as needed Assess ulceration(s) every visit Treatment Activities: Referred to DME Christien Frankl for dressing supplies : 04/10/2023 Skin care regimen initiated :  04/10/2023 Topical wound management initiated : 04/10/2023 Notes: Electronic Signature(s) Signed: 04/10/2023 2:18:50 PM By: Samuella Bruin Entered By: Samuella Bruin on 04/10/2023 10:20:32 -------------------------------------------------------------------------------- Pain Assessment Details Patient Name: Date of Service: [name] MA S A. 04/10/2023 12:45 PM Medical Record Number: [id number] Patient Account Number: [id number] Date of Birth/Sex: Treating RN: 1956/05/13 (67 y.o. [name] Primary Care Onesti Bonfiglio: Fara Chute Other Clinician: Referring Jocabed Cheese: Treating Madison Direnzo/Extender: Garret Reddish in Treatment: 0 Active Problems Location of Pain Severity and Description of Pain Patient Has Paino Yes Site Locations Pain Location: Pain in Ulcers Duration of the Pain. Constant / Intermittento Constant Rate the pain. Current Pain Level: 5 Character of Pain Describe the Pain: Throbbing Pain Management and Medication Current Pain Management: Medication: Yes Electronic Signature(s) Signed: 04/10/2023 2:18:50 PM By: Samuella Bruin Entered By: Samuella Bruin on 04/10/2023 09:55:07 Audie Clear A ([id number]) [id number] Page 7 of 9 -------------------------------------------------------------------------------- Patient/Caregiver Education Details Patient Name: Date of Service: [name] [place] 9/24/2024andnbsp12:45 PM Medical Record Number: [id number] Patient Account Number: [id number] Date of Birth/Gender: Treating RN: [birth year] (67 y.o. [name] Primary Care Physician: Fara Chute Other Clinician: Referring Physician: Treating Physician/Extender: Garret Reddish in Treatment: 0 Education Assessment Education Provided To: Patient Education Topics Provided Wound Debridement: Methods: Explain/Verbal Responses: Reinforcements needed, State content  correctly Wound/Skin Impairment: Methods: Explain/Verbal Responses: Reinforcements needed, State content correctly Electronic Signature(s) Signed: 04/10/2023 2:18:50 PM  topical ointment N/A N/A Pain Control: Subcutaneous, Slough N/A N/A Tissue Debrided: Skin/Subcutaneous Tissue N/A N/A Level: 0.22 N/A N/A Debridement A (sq cm): rea Curette N/A N/A Instrument: Minimum N/A N/A Bleeding: Pressure N/A N/A Hemostasis A chieved: Procedure was tolerated well N/A N/A Debridement Treatment Response: 0.2x1.4x0.3 N/A N/A Post Debridement Measurements L x W x D (cm) 0.066 N/A N/A Post Debridement Volume: (cm) No Abnormalities Noted N/A N/A Periwound Skin Texture: Maceration: Yes N/A N/A Periwound Skin Moisture: Rubor: Yes N/A N/A Periwound Skin Color: No Abnormality N/A N/A Temperature: Debridement N/A N/A Procedures Performed: Treatment Notes Electronic Signature(s) Signed: 04/10/2023 1:12:31 PM By: Duanne Guess MD FACS Entered By: Duanne Guess  on 04/10/2023 10:12:31 -------------------------------------------------------------------------------- Multi-Disciplinary Care Plan Details Patient Name: Date of Service: [name] MA S A. 04/10/2023 12:45 PM Medical Record Number: [id number] Patient Account Number: [id number] Date of Birth/Sex: Treating RN: 1955-09-27 (67 y.o. [name] Primary Care Damoney Julia: Fara Chute Other Clinician: Referring Kole Hilyard: Treating Saleah Rishel/Extender: Garret Reddish in Treatment: 0 Active Inactive Abuse / Safety / Falls / Self Care Management Nursing Diagnoses: Impaired physical mobility Knowledge deficit related to: safety; personal, health (wound), emergency Potential for falls Goals: Patient/caregiver will verbalize/demonstrate measures taken to improve the patient's personal safety Date Initiated: 04/10/2023 Target Resolution Date: 05/25/2023 Goal Status: Active Patient/caregiver will verbalize/demonstrate measures taken to prevent injury and/or falls Date Initiated: 04/10/2023 Target Resolution Date: 05/25/2023 Goal Status: Active Interventions: Assess fall risk on admission and as needed Assess impairment of mobility on admission and as needed per policy Notes: Wound/Skin Impairment Nursing Diagnoses: Impaired tissue integrity Knowledge deficit related to ulceration/compromised skin integrity Goals: Patient/caregiver will verbalize understanding of skin care regimen ISANDRO, SCHORER A ([id number]) [phone].pdf Page 6 of 9 Date Initiated: 04/10/2023 Target Resolution Date: 05/25/2023 Goal Status: Active Interventions: Assess patient/caregiver ability to obtain necessary supplies Assess patient/caregiver ability to perform ulcer/skin care regimen upon admission and as needed Assess ulceration(s) every visit Treatment Activities: Referred to DME Christien Frankl for dressing supplies : 04/10/2023 Skin care regimen initiated :  04/10/2023 Topical wound management initiated : 04/10/2023 Notes: Electronic Signature(s) Signed: 04/10/2023 2:18:50 PM By: Samuella Bruin Entered By: Samuella Bruin on 04/10/2023 10:20:32 -------------------------------------------------------------------------------- Pain Assessment Details Patient Name: Date of Service: [name] MA S A. 04/10/2023 12:45 PM Medical Record Number: [id number] Patient Account Number: [id number] Date of Birth/Sex: Treating RN: 1956/05/13 (67 y.o. [name] Primary Care Onesti Bonfiglio: Fara Chute Other Clinician: Referring Jocabed Cheese: Treating Madison Direnzo/Extender: Garret Reddish in Treatment: 0 Active Problems Location of Pain Severity and Description of Pain Patient Has Paino Yes Site Locations Pain Location: Pain in Ulcers Duration of the Pain. Constant / Intermittento Constant Rate the pain. Current Pain Level: 5 Character of Pain Describe the Pain: Throbbing Pain Management and Medication Current Pain Management: Medication: Yes Electronic Signature(s) Signed: 04/10/2023 2:18:50 PM By: Samuella Bruin Entered By: Samuella Bruin on 04/10/2023 09:55:07 Audie Clear A ([id number]) [id number] Page 7 of 9 -------------------------------------------------------------------------------- Patient/Caregiver Education Details Patient Name: Date of Service: [name] [place] 9/24/2024andnbsp12:45 PM Medical Record Number: [id number] Patient Account Number: [id number] Date of Birth/Gender: Treating RN: [birth year] (67 y.o. [name] Primary Care Physician: Fara Chute Other Clinician: Referring Physician: Treating Physician/Extender: Garret Reddish in Treatment: 0 Education Assessment Education Provided To: Patient Education Topics Provided Wound Debridement: Methods: Explain/Verbal Responses: Reinforcements needed, State content  correctly Wound/Skin Impairment: Methods: Explain/Verbal Responses: Reinforcements needed, State content correctly Electronic Signature(s) Signed: 04/10/2023 2:18:50 PM  topical ointment N/A N/A Pain Control: Subcutaneous, Slough N/A N/A Tissue Debrided: Skin/Subcutaneous Tissue N/A N/A Level: 0.22 N/A N/A Debridement A (sq cm): rea Curette N/A N/A Instrument: Minimum N/A N/A Bleeding: Pressure N/A N/A Hemostasis A chieved: Procedure was tolerated well N/A N/A Debridement Treatment Response: 0.2x1.4x0.3 N/A N/A Post Debridement Measurements L x W x D (cm) 0.066 N/A N/A Post Debridement Volume: (cm) No Abnormalities Noted N/A N/A Periwound Skin Texture: Maceration: Yes N/A N/A Periwound Skin Moisture: Rubor: Yes N/A N/A Periwound Skin Color: No Abnormality N/A N/A Temperature: Debridement N/A N/A Procedures Performed: Treatment Notes Electronic Signature(s) Signed: 04/10/2023 1:12:31 PM By: Duanne Guess MD FACS Entered By: Duanne Guess  on 04/10/2023 10:12:31 -------------------------------------------------------------------------------- Multi-Disciplinary Care Plan Details Patient Name: Date of Service: [name] MA S A. 04/10/2023 12:45 PM Medical Record Number: [id number] Patient Account Number: [id number] Date of Birth/Sex: Treating RN: 1955-09-27 (67 y.o. [name] Primary Care Damoney Julia: Fara Chute Other Clinician: Referring Kole Hilyard: Treating Saleah Rishel/Extender: Garret Reddish in Treatment: 0 Active Inactive Abuse / Safety / Falls / Self Care Management Nursing Diagnoses: Impaired physical mobility Knowledge deficit related to: safety; personal, health (wound), emergency Potential for falls Goals: Patient/caregiver will verbalize/demonstrate measures taken to improve the patient's personal safety Date Initiated: 04/10/2023 Target Resolution Date: 05/25/2023 Goal Status: Active Patient/caregiver will verbalize/demonstrate measures taken to prevent injury and/or falls Date Initiated: 04/10/2023 Target Resolution Date: 05/25/2023 Goal Status: Active Interventions: Assess fall risk on admission and as needed Assess impairment of mobility on admission and as needed per policy Notes: Wound/Skin Impairment Nursing Diagnoses: Impaired tissue integrity Knowledge deficit related to ulceration/compromised skin integrity Goals: Patient/caregiver will verbalize understanding of skin care regimen ISANDRO, SCHORER A ([id number]) [phone].pdf Page 6 of 9 Date Initiated: 04/10/2023 Target Resolution Date: 05/25/2023 Goal Status: Active Interventions: Assess patient/caregiver ability to obtain necessary supplies Assess patient/caregiver ability to perform ulcer/skin care regimen upon admission and as needed Assess ulceration(s) every visit Treatment Activities: Referred to DME Christien Frankl for dressing supplies : 04/10/2023 Skin care regimen initiated :  04/10/2023 Topical wound management initiated : 04/10/2023 Notes: Electronic Signature(s) Signed: 04/10/2023 2:18:50 PM By: Samuella Bruin Entered By: Samuella Bruin on 04/10/2023 10:20:32 -------------------------------------------------------------------------------- Pain Assessment Details Patient Name: Date of Service: [name] MA S A. 04/10/2023 12:45 PM Medical Record Number: [id number] Patient Account Number: [id number] Date of Birth/Sex: Treating RN: 1956/05/13 (67 y.o. [name] Primary Care Onesti Bonfiglio: Fara Chute Other Clinician: Referring Jocabed Cheese: Treating Madison Direnzo/Extender: Garret Reddish in Treatment: 0 Active Problems Location of Pain Severity and Description of Pain Patient Has Paino Yes Site Locations Pain Location: Pain in Ulcers Duration of the Pain. Constant / Intermittento Constant Rate the pain. Current Pain Level: 5 Character of Pain Describe the Pain: Throbbing Pain Management and Medication Current Pain Management: Medication: Yes Electronic Signature(s) Signed: 04/10/2023 2:18:50 PM By: Samuella Bruin Entered By: Samuella Bruin on 04/10/2023 09:55:07 Audie Clear A ([id number]) [id number] Page 7 of 9 -------------------------------------------------------------------------------- Patient/Caregiver Education Details Patient Name: Date of Service: [name] [place] 9/24/2024andnbsp12:45 PM Medical Record Number: [id number] Patient Account Number: [id number] Date of Birth/Gender: Treating RN: [birth year] (67 y.o. [name] Primary Care Physician: Fara Chute Other Clinician: Referring Physician: Treating Physician/Extender: Garret Reddish in Treatment: 0 Education Assessment Education Provided To: Patient Education Topics Provided Wound Debridement: Methods: Explain/Verbal Responses: Reinforcements needed, State content  correctly Wound/Skin Impairment: Methods: Explain/Verbal Responses: Reinforcements needed, State content correctly Electronic Signature(s) Signed: 04/10/2023 2:18:50 PM  the hospital within the last three years: Yes Total Score: 100 Level Of Care: New/Established - Level 3 Electronic Signature(s) Signed: 04/10/2023 2:18:50 PM By: Samuella Bruin Entered By: Samuella Bruin on 04/10/2023 10:08:14 Audie Clear A ([id number]) [id number].pdf Page 3 of 9 -------------------------------------------------------------------------------- Encounter Discharge Information Details Patient Name: Date of Service: [name] [place] A. 04/10/2023 12:45 PM Medical Record Number: [id number] Patient Account Number: [id number] Date of Birth/Sex: Treating RN: Feb 28, 1956 (67 y.o. [name] Primary Care Seymone Forlenza: Fara Chute Other Clinician: Referring Wynter Grave: Treating Gustaf Mccarter/Extender: Garret Reddish in Treatment: 0 Encounter Discharge Information Items Post Procedure Vitals Discharge Condition: Stable Temperature (F): 97.5 Ambulatory Status: Cane Pulse (bpm): 61 Discharge Destination: Home Respiratory Rate (breaths/min): 16 Transportation: Private Auto Blood Pressure (mmHg): 177/95 Accompanied By: self Schedule Follow-up Appointment: Yes Clinical Summary of Care: Patient Declined Electronic Signature(s) Signed: 04/10/2023 2:18:50 PM By: Samuella Bruin Entered By: Samuella Bruin on 04/10/2023 10:21:04 -------------------------------------------------------------------------------- Lower Extremity Assessment Details Patient Name: Date of Service: [name] MA S A. 04/10/2023 12:45 PM Medical Record Number: [id number] Patient Account Number: [id number] Date of Birth/Sex: Treating RN: Mar 06, 1956  (67 y.o. [name] Primary Care Esraa Seres: Fara Chute Other Clinician: Referring Shakemia Madera: Treating Mallory Schaad/Extender: Garret Reddish in Treatment: 0 Edema Assessment Assessed: Kyra Searles: No] Franne Forts: No] [Left: Edema] [Right: :] Calf Left: Right: Point of Measurement: From Medial Instep 34 cm Ankle Left: Right: Point of Measurement: From Medial Instep 20 cm Vascular Assessment Pulses: Dorsalis Pedis Palpable: [Right:Yes] Doppler Audible: [Right:Yes] Extremity colors, hair growth, and conditions: Extremity Color: [Right:Normal] Hair Growth on Extremity: [Right:No] Temperature of Extremity: [Right:Warm] Capillary Refill: [Right:< 3 seconds] Dependent Rubor: [Right:No] Blanched when Elevated: [Right:No] Lipodermatosclerosis: [Right:No] Blood Pressure: Buhl, Jayvien A ([id number]) [Right:[id number].pdf Page 4 of 9] Brachial: [Right:177] Ankle: [Right:Dorsalis Pedis: 210 1.19] Toe Nail Assessment Left: Right: Thick: No Discolored: No Deformed: No Improper Length and Hygiene: No Electronic Signature(s) Signed: 04/10/2023 2:18:50 PM By: Samuella Bruin Entered By: Samuella Bruin on 04/10/2023 09:50:26 -------------------------------------------------------------------------------- Multi Wound Chart Details Patient Name: Date of Service: [name] MA S A. 04/10/2023 12:45 PM Medical Record Number: [id number] Patient Account Number: [id number] Date of Birth/Sex: Treating RN: April 06, 1956 (67 y.o. 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 Sleep Apnea, Type II Diabetes, N/A N/A Comorbid History: Osteomyelitis 02/28/2023 N/A N/A Date Acquired: 0 N/A N/A Weeks of Treatment: Open N/A N/A Wound Status: No N/A N/A Wound Recurrence: 0.2x1.4x0.3 N/A N/A Measurements L x W x D (cm) 0.22 N/A N/A A (cm) : rea 0.066 N/A N/A Volume (cm) : Grade 2 N/A N/A Classification: Medium N/A N/A Exudate A mount: Serosanguineous N/A N/A Exudate Type: red, brown N/A N/A Exudate Color: Distinct, outline attached N/A N/A Wound Margin: Large (67-100%) N/A N/A Granulation A mount: Red N/A N/A Granulation Quality: Small (1-33%) N/A N/A Necrotic A mount: Fat Layer (Subcutaneous Tissue): Yes N/A N/A Exposed Structures: Bone: Yes Fascia: No Tendon: No Muscle: No Joint: No KRU, NOVICK A ([id number]) [id number].pdf Page 5 of 9 Small (1-33%) N/A N/A Epithelialization: Debridement - Excisional N/A N/A Debridement: 13:03 N/A N/A Pre-procedure Verification/Time Out Taken: Lidocaine 5%

## 2023-04-10 NOTE — Progress Notes (Signed)
applying [name] clean dressing using gauze sponges, not tissue or cotton balls. Topical: Gentamicin 1 x Per Day/30 Days Discharge Instructions: As directed by physician Topical: Mupirocin Ointment 1 x Per Day/30 Days Discharge Instructions: Apply Mupirocin (Bactroban) as instructed Prim Dressing: Maxorb Extra Ag+ Alginate Dressing, 2x2 (in/in) 1 x Per Day/30 Days ary Discharge Instructions: Apply to wound bed as instructed Secondary Dressing: Woven Gauze Sponges 2x2 in 1 x Per Day/30 Days Discharge Instructions: Apply over primary dressing as directed. Secured With: Insurance underwriter, Sterile 2x75 (in/in) 1 x Per  Day/30 Days Discharge Instructions: Secure with stretch gauze as directed. Secured With: 28M Medipore H Soft Cloth Surgical T ape, 4 x 10 (in/yd) 1 x Per Day/30 Days Discharge Instructions: Secure with tape as directed. 04/10/2023: This is [name] 67 year old diabetic who had [name] syncopal event that resulted in an injury to his right fourth toe. On the dorsal surface of his right fourth toe, there is [name] centimeter linear ulcer that probes to bone. There is no malodor or purulent drainage. There is still some soft tissue swelling, but no erythema. I used [name] curette to debride slough and subcutaneous tissue from the wound. We are going to have him pack the space that probes to bone with [name] mixture of topical gentamicin and mupirocin. Apply silver alginate to the open surface and wrapped with gauze. He says he is unable to walk in [name] forefoot offloading shoe Kyle [name], Kyle [name] ([id number]) [id number] Page 7 of 9 due to his inherent gait instability so we will just pad the area well to protect it in his regular shoe. Follow-up in 1 week. Electronic Signature(s) Signed: 04/10/2023 1:19:37 PM By: Duanne Guess MD FACS Entered By: Duanne Guess on 04/10/2023 13:19:37 -------------------------------------------------------------------------------- HxROS Details Patient Name: Date of Service: Kyle [name]. 04/10/2023 12:45 PM Medical Record Number: [id number] Patient Account Number: [id number] Date of Birth/Sex: Treating RN: 05-May-1956 (67 y.o. 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 Constitutional Symptoms (General Health) Medical History: Past Medical History Notes: MSSA infection Eyes Complaints and Symptoms: Review of 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 Medical History: Past Medical History Notes: Retinal vasculitis Ear/Nose/Mouth/Throat Medical History: Past Medical History Notes: Dysphagia Hematologic/Lymphatic Respiratory Kyle [name], Kyle [name] ([id number]) [id number] Page 8 of 9 Medical History: Positive for: Sleep Apnea Gastrointestinal Medical History: Past Medical History Notes: GERD Endocrine Medical History: Positive for: Type II Diabetes Time with diabetes: 1994 Treated with: Insulin Blood sugar tested every day: Yes Tested : twice [name] day Immunological Neurologic Medical History: Past Medical History Notes: Temporal arteritis, Occipital neuralgia of right side, NDPH (new daily persistent headache) Oncologic Psychiatric Medical History: Past Medical History Notes: depression Immunizations Pneumococcal Vaccine: Received Pneumococcal Vaccination: No Implantable Devices Yes Hospitalization / Surgery History Type of Hospitalization/Surgery Olecranon bursectomy (Left) Ulnar nerve transposition (Left) tooth removal spinal stimulator implanted Back surgery Femur surgery Cholecystectomy Family and Social History Cancer: Yes - Mother; Diabetes: No; Heart Disease: Yes - Father; Hereditary Spherocytosis: No; Hypertension: Yes - Father; Kidney Disease: No; Lung Disease: No; Seizures: No; Stroke: No; Thyroid Problems: No;  applying [name] clean dressing using gauze sponges, not tissue or cotton balls. Topical: Gentamicin 1 x Per Day/30 Days Discharge Instructions: As directed by physician Topical: Mupirocin Ointment 1 x Per Day/30 Days Discharge Instructions: Apply Mupirocin (Bactroban) as instructed Prim Dressing: Maxorb Extra Ag+ Alginate Dressing, 2x2 (in/in) 1 x Per Day/30 Days ary Discharge Instructions: Apply to wound bed as instructed Secondary Dressing: Woven Gauze Sponges 2x2 in 1 x Per Day/30 Days Discharge Instructions: Apply over primary dressing as directed. Secured With: Insurance underwriter, Sterile 2x75 (in/in) 1 x Per  Day/30 Days Discharge Instructions: Secure with stretch gauze as directed. Secured With: 28M Medipore H Soft Cloth Surgical T ape, 4 x 10 (in/yd) 1 x Per Day/30 Days Discharge Instructions: Secure with tape as directed. 04/10/2023: This is [name] 67 year old diabetic who had [name] syncopal event that resulted in an injury to his right fourth toe. On the dorsal surface of his right fourth toe, there is [name] centimeter linear ulcer that probes to bone. There is no malodor or purulent drainage. There is still some soft tissue swelling, but no erythema. I used [name] curette to debride slough and subcutaneous tissue from the wound. We are going to have him pack the space that probes to bone with [name] mixture of topical gentamicin and mupirocin. Apply silver alginate to the open surface and wrapped with gauze. He says he is unable to walk in [name] forefoot offloading shoe Kyle [name], Kyle [name] ([id number]) [id number] Page 7 of 9 due to his inherent gait instability so we will just pad the area well to protect it in his regular shoe. Follow-up in 1 week. Electronic Signature(s) Signed: 04/10/2023 1:19:37 PM By: Duanne Guess MD FACS Entered By: Duanne Guess on 04/10/2023 13:19:37 -------------------------------------------------------------------------------- HxROS Details Patient Name: Date of Service: Kyle [name]. 04/10/2023 12:45 PM Medical Record Number: [id number] Patient Account Number: [id number] Date of Birth/Sex: Treating RN: 05-May-1956 (67 y.o. 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 Constitutional Symptoms (General Health) Medical History: Past Medical History Notes: MSSA infection Eyes Complaints and Symptoms: Review of 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 Medical History: Past Medical History Notes: Retinal vasculitis Ear/Nose/Mouth/Throat Medical History: Past Medical History Notes: Dysphagia Hematologic/Lymphatic Respiratory Kyle [name], Kyle [name] ([id number]) [id number] Page 8 of 9 Medical History: Positive for: Sleep Apnea Gastrointestinal Medical History: Past Medical History Notes: GERD Endocrine Medical History: Positive for: Type II Diabetes Time with diabetes: 1994 Treated with: Insulin Blood sugar tested every day: Yes Tested : twice [name] day Immunological Neurologic Medical History: Past Medical History Notes: Temporal arteritis, Occipital neuralgia of right side, NDPH (new daily persistent headache) Oncologic Psychiatric Medical History: Past Medical History Notes: depression Immunizations Pneumococcal Vaccine: Received Pneumococcal Vaccination: No Implantable Devices Yes Hospitalization / Surgery History Type of Hospitalization/Surgery Olecranon bursectomy (Left) Ulnar nerve transposition (Left) tooth removal spinal stimulator implanted Back surgery Femur surgery Cholecystectomy Family and Social History Cancer: Yes - Mother; Diabetes: No; Heart Disease: Yes - Father; Hereditary Spherocytosis: No; Hypertension: Yes - Father; Kidney Disease: No; Lung Disease: No; Seizures: No; Stroke: No; Thyroid Problems: No;  applying [name] clean dressing using gauze sponges, not tissue or cotton balls. Topical: Gentamicin 1 x Per Day/30 Days Discharge Instructions: As directed by physician Topical: Mupirocin Ointment 1 x Per Day/30 Days Discharge Instructions: Apply Mupirocin (Bactroban) as instructed Prim Dressing: Maxorb Extra Ag+ Alginate Dressing, 2x2 (in/in) 1 x Per Day/30 Days ary Discharge Instructions: Apply to wound bed as instructed Secondary Dressing: Woven Gauze Sponges 2x2 in 1 x Per Day/30 Days Discharge Instructions: Apply over primary dressing as directed. Secured With: Insurance underwriter, Sterile 2x75 (in/in) 1 x Per  Day/30 Days Discharge Instructions: Secure with stretch gauze as directed. Secured With: 28M Medipore H Soft Cloth Surgical T ape, 4 x 10 (in/yd) 1 x Per Day/30 Days Discharge Instructions: Secure with tape as directed. 04/10/2023: This is [name] 67 year old diabetic who had [name] syncopal event that resulted in an injury to his right fourth toe. On the dorsal surface of his right fourth toe, there is [name] centimeter linear ulcer that probes to bone. There is no malodor or purulent drainage. There is still some soft tissue swelling, but no erythema. I used [name] curette to debride slough and subcutaneous tissue from the wound. We are going to have him pack the space that probes to bone with [name] mixture of topical gentamicin and mupirocin. Apply silver alginate to the open surface and wrapped with gauze. He says he is unable to walk in [name] forefoot offloading shoe Kyle [name], Kyle [name] ([id number]) [id number] Page 7 of 9 due to his inherent gait instability so we will just pad the area well to protect it in his regular shoe. Follow-up in 1 week. Electronic Signature(s) Signed: 04/10/2023 1:19:37 PM By: Duanne Guess MD FACS Entered By: Duanne Guess on 04/10/2023 13:19:37 -------------------------------------------------------------------------------- HxROS Details Patient Name: Date of Service: Kyle [name]. 04/10/2023 12:45 PM Medical Record Number: [id number] Patient Account Number: [id number] Date of Birth/Sex: Treating RN: 05-May-1956 (67 y.o. 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 Constitutional Symptoms (General Health) Medical History: Past Medical History Notes: MSSA infection Eyes Complaints and Symptoms: Review of 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 Medical History: Past Medical History Notes: Retinal vasculitis Ear/Nose/Mouth/Throat Medical History: Past Medical History Notes: Dysphagia Hematologic/Lymphatic Respiratory Kyle [name], Kyle [name] ([id number]) [id number] Page 8 of 9 Medical History: Positive for: Sleep Apnea Gastrointestinal Medical History: Past Medical History Notes: GERD Endocrine Medical History: Positive for: Type II Diabetes Time with diabetes: 1994 Treated with: Insulin Blood sugar tested every day: Yes Tested : twice [name] day Immunological Neurologic Medical History: Past Medical History Notes: Temporal arteritis, Occipital neuralgia of right side, NDPH (new daily persistent headache) Oncologic Psychiatric Medical History: Past Medical History Notes: depression Immunizations Pneumococcal Vaccine: Received Pneumococcal Vaccination: No Implantable Devices Yes Hospitalization / Surgery History Type of Hospitalization/Surgery Olecranon bursectomy (Left) Ulnar nerve transposition (Left) tooth removal spinal stimulator implanted Back surgery Femur surgery Cholecystectomy Family and Social History Cancer: Yes - Mother; Diabetes: No; Heart Disease: Yes - Father; Hereditary Spherocytosis: No; Hypertension: Yes - Father; Kidney Disease: No; Lung Disease: No; Seizures: No; Stroke: No; Thyroid Problems: No;  applying [name] clean dressing using gauze sponges, not tissue or cotton balls. Topical: Gentamicin 1 x Per Day/30 Days Discharge Instructions: As directed by physician Topical: Mupirocin Ointment 1 x Per Day/30 Days Discharge Instructions: Apply Mupirocin (Bactroban) as instructed Prim Dressing: Maxorb Extra Ag+ Alginate Dressing, 2x2 (in/in) 1 x Per Day/30 Days ary Discharge Instructions: Apply to wound bed as instructed Secondary Dressing: Woven Gauze Sponges 2x2 in 1 x Per Day/30 Days Discharge Instructions: Apply over primary dressing as directed. Secured With: Insurance underwriter, Sterile 2x75 (in/in) 1 x Per  Day/30 Days Discharge Instructions: Secure with stretch gauze as directed. Secured With: 28M Medipore H Soft Cloth Surgical T ape, 4 x 10 (in/yd) 1 x Per Day/30 Days Discharge Instructions: Secure with tape as directed. 04/10/2023: This is [name] 67 year old diabetic who had [name] syncopal event that resulted in an injury to his right fourth toe. On the dorsal surface of his right fourth toe, there is [name] centimeter linear ulcer that probes to bone. There is no malodor or purulent drainage. There is still some soft tissue swelling, but no erythema. I used [name] curette to debride slough and subcutaneous tissue from the wound. We are going to have him pack the space that probes to bone with [name] mixture of topical gentamicin and mupirocin. Apply silver alginate to the open surface and wrapped with gauze. He says he is unable to walk in [name] forefoot offloading shoe Kyle [name], Kyle [name] ([id number]) [id number] Page 7 of 9 due to his inherent gait instability so we will just pad the area well to protect it in his regular shoe. Follow-up in 1 week. Electronic Signature(s) Signed: 04/10/2023 1:19:37 PM By: Duanne Guess MD FACS Entered By: Duanne Guess on 04/10/2023 13:19:37 -------------------------------------------------------------------------------- HxROS Details Patient Name: Date of Service: Kyle [name]. 04/10/2023 12:45 PM Medical Record Number: [id number] Patient Account Number: [id number] Date of Birth/Sex: Treating RN: 05-May-1956 (67 y.o. 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 Constitutional Symptoms (General Health) Medical History: Past Medical History Notes: MSSA infection Eyes Complaints and Symptoms: Review of 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 Medical History: Past Medical History Notes: Retinal vasculitis Ear/Nose/Mouth/Throat Medical History: Past Medical History Notes: Dysphagia Hematologic/Lymphatic Respiratory Kyle [name], Kyle [name] ([id number]) [id number] Page 8 of 9 Medical History: Positive for: Sleep Apnea Gastrointestinal Medical History: Past Medical History Notes: GERD Endocrine Medical History: Positive for: Type II Diabetes Time with diabetes: 1994 Treated with: Insulin Blood sugar tested every day: Yes Tested : twice [name] day Immunological Neurologic Medical History: Past Medical History Notes: Temporal arteritis, Occipital neuralgia of right side, NDPH (new daily persistent headache) Oncologic Psychiatric Medical History: Past Medical History Notes: depression Immunizations Pneumococcal Vaccine: Received Pneumococcal Vaccination: No Implantable Devices Yes Hospitalization / Surgery History Type of Hospitalization/Surgery Olecranon bursectomy (Left) Ulnar nerve transposition (Left) tooth removal spinal stimulator implanted Back surgery Femur surgery Cholecystectomy Family and Social History Cancer: Yes - Mother; Diabetes: No; Heart Disease: Yes - Father; Hereditary Spherocytosis: No; Hypertension: Yes - Father; Kidney Disease: No; Lung Disease: No; Seizures: No; Stroke: No; Thyroid Problems: No;  By: Duanne Guess MD FACS Entered By: Duanne Guess on 04/10/2023 13:17:11 -------------------------------------------------------------------------------- Problem List Details Patient Name: Date of Service: Kyle [name]. 04/10/2023 12:45 PM Medical Record Number: [id number] Patient Account Number: [id number] Date of Birth/Sex: Treating RN: 1955-09-04 (67 y.o. 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 04/10/2023 12:45 PM Medical Record Number: [id number] Patient Account Number: [id number] Date of Birth/Sex: Treating RN: July 07, 1956 (67 y.o. M) Primary Care Provider: Fara Chute Other Clinician: Referring Provider: Treating Provider/Extender: Garret Reddish in Treatment: 0 Subjective Chief Complaint Kyle [name], Kyle [name] ([id number]) [id number] Page 5 of 9 Information obtained from Patient Patients presents for treatment of an open diabetic ulcer History of Present Illness (HPI) ADMISSION 04/10/2023 ***ABI NON-COMPRESSIBLE; STRONG DOPPLER SIGNALS*** This is [name] 67 year old diabetic (no recent hemoglobin A1c available for review) who suffered [name] syncopal event in early September. He apparently injured his right fourth toe as well as had pain in his ankle. The went to see his doctor about [name] week after his event due to continued pain in his ankle. Apparently his PCP was more concerned about his toe and sent him to the ED for further evaluation due to concern for possible osteomyelitis. Examination imaging done in the emergency department showed [name] comminuted fracture of the middle phalanx of the fourth toe with minimal displacement. No other abnormalities were appreciated. He was given [name] course of cephalexin and referred to the wound care center for further evaluation and management. They did put him in [name] postop shoe for his toe and an Ace bandage for the ankle. He has not been wearing the postop shoe due to gait instability and has been applying peroxide and Neosporin to his toe wound. Patient History Information obtained from Patient, Chart. Allergies aspirin, ibuprofen Family History Cancer - Mother, Heart Disease - Father, Hypertension - Father, No family history of  Diabetes, Hereditary Spherocytosis, Kidney Disease, Lung Disease, Seizures, Stroke, Thyroid Problems, Tuberculosis. Social History Former smoker - quit in 1987, Marital Status - Married, Alcohol Use - Rarely, Drug Use - No History, Caffeine Use - Daily. Medical History Respiratory Patient has history of Sleep Apnea Endocrine Patient has history of Type II Diabetes Musculoskeletal Patient has history of Osteomyelitis - of left ulna Patient is treated with Insulin. Blood sugar is tested. Hospitalization/Surgery History - Olecranon bursectomy (Left). - Ulnar nerve transposition (Left). - tooth removal. - spinal stimulator implanted. - Back surgery. - Femur surgery. - Cholecystectomy. 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 Genitourinary Denies complaints or symptoms of Frequent urination. Integumentary (Skin) Complains or has  PM Version By: Duanne Guess MD FACS Entered By: Duanne Guess on 04/10/2023 13:13:24 -------------------------------------------------------------------------------- Physical Exam Details Patient Name: Date of Service: Kyle [name]. 04/10/2023 12:45 PM Medical Record Number: [id number] Patient Account Number: [id number] Date of Birth/Sex: Treating RN: 04/10/1956 (67 y.o. M) Primary Care Provider: Fara Chute Other Clinician: Referring Provider: Treating Provider/Extender: Garret Reddish in Treatment: 0 Constitutional Hypertensive, asymptomatic. . . . No acute distress. Respiratory Normal work of breathing on room air. Notes 04/10/2023: On the dorsal surface of his right fourth toe, there is [name] centimeter linear ulcer that probes to bone. There is no malodor or purulent drainage. There is still some soft tissue swelling, but no erythema. Electronic Signature(s) Kyle [name], Kyle [name] ([id number]) [id number] Page 3 of 9 Signed: 04/10/2023 1:15:42 PM By: Duanne Guess MD FACS Entered By: Duanne Guess on 04/10/2023 13:15:41 -------------------------------------------------------------------------------- Physician Orders Details Patient Name: Date of Service: Kyle [name]. 04/10/2023 12:45 PM Medical Record Number: [id number] Patient Account Number: [id number] Date of Birth/Sex: Treating RN: 11/19/1955 (67 y.o. 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 - Dr. Lady Gary - room 2 Return [name] Anesthetic (In clinic) Topical Lidocaine 5% applied to wound bed Bathing/ Shower/ Hygiene May shower and wash wound with soap and water. Edema Control - Lymphedema / SCD / Other Elevate legs to the level of the heart or above for 30 minutes daily and/or when sitting for 3-4 times [name] day throughout the day. Avoid standing for long periods of time. Wound Treatment Wound #1 - T Fourth oe Wound Laterality: Right Cleanser: Soap and Water 1 x Per Day/30 Days Discharge Instructions: May shower and wash wound with dial antibacterial soap and water prior to dressing change. Cleanser: Wound Cleanser 1 x Per Day/30 Days Discharge Instructions: Cleanse the wound with wound cleanser prior to applying [name] clean dressing using gauze sponges, not tissue or cotton balls. Topical: Gentamicin 1 x Per Day/30 Days Discharge Instructions: As directed by physician Topical: Mupirocin Ointment 1 x Per Day/30 Days Discharge Instructions: Apply Mupirocin (Bactroban) as instructed Prim Dressing: Maxorb Extra Ag+ Alginate Dressing, 2x2 (in/in) 1 x Per Day/30 Days ary Discharge Instructions: Apply to wound bed as instructed Secondary Dressing: Woven Gauze Sponges 2x2 in 1 x Per Day/30 Days Discharge Instructions: Apply over primary dressing as directed. Secured With: Insurance underwriter, Sterile 2x75 (in/in) 1 x  Per Day/30 Days Discharge Instructions: Secure with stretch gauze as directed. Secured With: 67M Medipore H Soft Cloth Surgical T ape, 4 x 10 (in/yd) 1 x Per Day/30 Days Discharge Instructions: Secure with tape as directed. Patient Medications llergies: aspirin, ibuprofen [name] Notifications Medication Indication Start End 04/10/2023 lidocaine LEXIS, Kyle [name] ([id number]) [id number] Page 4 of 9 DOSE topical 5 % ointment - ointment topical 04/10/2023 gentamicin DOSE topical 0.1 % ointment - Apply as directed to wound with dressing changes 04/10/2023 mupirocin DOSE topical 2 % ointment - Apply as directed to wound with dressing changes Electronic Signature(s) Signed: 04/10/2023 2:42:22 PM By: Duanne Guess MD FACS Previous Signature: 04/10/2023 1:17:00 PM Version

## 2023-04-17 ENCOUNTER — Encounter (HOSPITAL_BASED_OUTPATIENT_CLINIC_OR_DEPARTMENT_OTHER): Payer: Medicare PPO | Admitting: Internal Medicine

## 2023-04-18 ENCOUNTER — Encounter (HOSPITAL_BASED_OUTPATIENT_CLINIC_OR_DEPARTMENT_OTHER): Payer: Medicare PPO | Admitting: General Surgery

## 2023-04-26 ENCOUNTER — Encounter (HOSPITAL_BASED_OUTPATIENT_CLINIC_OR_DEPARTMENT_OTHER): Payer: Medicare PPO | Attending: General Surgery | Admitting: General Surgery

## 2023-04-26 DIAGNOSIS — L97516 Non-pressure chronic ulcer of other part of right foot with bone involvement without evidence of necrosis: Secondary | ICD-10-CM | POA: Diagnosis not present

## 2023-04-26 DIAGNOSIS — K219 Gastro-esophageal reflux disease without esophagitis: Secondary | ICD-10-CM | POA: Diagnosis not present

## 2023-04-26 DIAGNOSIS — G4730 Sleep apnea, unspecified: Secondary | ICD-10-CM | POA: Insufficient documentation

## 2023-04-26 DIAGNOSIS — X58XXXA Exposure to other specified factors, initial encounter: Secondary | ICD-10-CM | POA: Insufficient documentation

## 2023-04-26 DIAGNOSIS — I10 Essential (primary) hypertension: Secondary | ICD-10-CM | POA: Diagnosis not present

## 2023-04-26 DIAGNOSIS — E11621 Type 2 diabetes mellitus with foot ulcer: Secondary | ICD-10-CM | POA: Insufficient documentation

## 2023-04-26 DIAGNOSIS — E11622 Type 2 diabetes mellitus with other skin ulcer: Secondary | ICD-10-CM | POA: Insufficient documentation

## 2023-04-26 DIAGNOSIS — S99922A Unspecified injury of left foot, initial encounter: Secondary | ICD-10-CM | POA: Insufficient documentation

## 2023-04-26 NOTE — Progress Notes (Addendum)
ARASH, KARSTENS Kyle [name] ([id number]) [id number] Page 1 of 7 Visit Report for 04/26/2023 Chief Complaint Document Details Patient Name: Date of Service: Kyle Kyle [name] [place] S Kyle [name]. 04/26/2023 12:30 PM Medical Record Number: [id number] Patient Account Number: [id number] Date of Birth/Sex: Treating RN: [birth year] (67 y.o. M) Primary Care Provider: Fara [name] Other Clinician: Referring Provider: Treating Provider/Extender: Kyle Kyle [name] in Treatment: 2 Information Obtained from: Patient Chief Complaint Patients presents for treatment of an open diabetic ulcer Electronic Signature(s) Signed: 04/26/2023 1:22:28 PM By: Kyle Guess MD FACS Entered By: Kyle Kyle [name] on 04/26/2023 10:22:28 -------------------------------------------------------------------------------- HPI Details Patient Name: Date of Service: Kyle Face MA S Kyle [name]. 04/26/2023 12:30 PM Medical Record Number: [id number] Patient Account Number: [id number] Date of Birth/Sex: Treating RN: Aug 16, 1955 (67 y.o. M) Primary Care Provider: Fara [name] Other Clinician: Referring Provider: Treating Provider/Extender: Kyle Kyle [name] in Treatment: 2 History of Present Illness HPI Description: ADMISSION 04/10/2023 ***ABI NON-COMPRESSIBLE; STRONG DOPPLER SIGNALS*** This is Kyle [name] 67 year old diabetic (no recent hemoglobin A1c available for review) who suffered Kyle [name] syncopal event in early September. He apparently injured his left fourth toe as well as had pain in his ankle. The went to see his doctor about Kyle [name] week after his event due to continued pain in his ankle. Apparently his PCP was more concerned about his toe and sent him to the ED for further evaluation due to concern for possible osteomyelitis. Examination imaging done in the emergency department showed Kyle [name] comminuted fracture of the middle phalanx of the fourth toe with minimal displacement. No other abnormalities  were appreciated. He was given Kyle [name] course of cephalexin and referred to the wound care center for further evaluation and management. They did put him in Kyle [name] postop shoe for his toe and an Ace bandage for the ankle. He has not been wearing the postop shoe due to gait instability and has been applying peroxide and Neosporin to his toe wound. 04/26/2023: The depth of the wound has filled in completely with granulation tissue. There is no slough accumulation, no malodor or purulent drainage. Electronic Signature(s) Signed: 04/26/2023 1:23:05 PM By: Kyle Guess MD FACS Entered By: Kyle Kyle [name] on 04/26/2023 10:23:05 Physical Exam Details -------------------------------------------------------------------------------- Kyle Kyle [name] ([id number]) [id number] Page 2 of 7 Patient Name: Date of Service: Kyle Kyle [name] [place] S Kyle [name]. 04/26/2023 12:30 PM Medical Record Number: [id number] Patient Account Number: [id number] Date of Birth/Sex: Treating RN: 1956/06/02 (67 y.o. M) Primary Care Provider: Fara [name] Other Clinician: Referring Provider: Treating Provider/Extender: Kyle Kyle [name] in Treatment: 2 Constitutional Hypertensive, asymptomatic. . . . no acute distress. Respiratory Normal work of breathing on room air. Notes 04/26/2023: The depth of the wound has filled in completely with granulation tissue. There is no slough accumulation, no malodor or purulent drainage. Electronic Signature(s) Signed: 04/26/2023 1:23:41 PM By: Kyle Guess MD FACS Entered By: Kyle Kyle [name] on 04/26/2023 10:23:40 -------------------------------------------------------------------------------- Physician Orders Details Patient Name: Date of Service: Kyle Face MA S Kyle [name]. 04/26/2023 12:30 PM Medical Record Number: [id number] Patient Account Number: [id number] Date of Birth/Sex: Treating RN: [birth year] (67 y.o. 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 - Dr. Lady [name] - room 1 Return Kyle [name] Monday 10/21 @ 1:15 pm Anesthetic (In clinic) Topical Lidocaine 5% applied to wound bed Bathing/ Shower/ Hygiene May shower and wash wound with soap and water. Edema Control - Lymphedema / SCD / Other Elevate legs to the level of the heart or above for 30 minutes daily and/or when sitting for 3-4 times Kyle [name] day throughout the day. Avoid standing for long periods of time. Wound Treatment Wound #1 - T Fourth oe Wound Laterality: Right Cleanser: Soap and Water 1 x Per Day/30 Days Discharge Instructions: May shower and wash wound with dial antibacterial soap and water prior to dressing change. Cleanser: Wound Cleanser 1 x Per Day/30 Days Discharge Instructions: Cleanse the wound with wound cleanser prior to applying Kyle [name] clean dressing using gauze sponges, not tissue or cotton balls. Topical: Gentamicin 1 x Per Day/30 Days Discharge Instructions: As directed by physician Topical: Mupirocin Ointment 1 x Per Day/30 Days Discharge Instructions: Apply Mupirocin (Bactroban) as instructed Kyle Kyle [name], Kyle Kyle [name] ([id number]) [id number] Page 3 of 7 Prim Dressing: Maxorb Extra Ag+ Alginate Dressing, 2x2 (in/in) 1 x Per Day/30 Days ary Discharge Instructions: Apply to wound bed as instructed Secondary Dressing: Woven Gauze Sponges 2x2 in 1 x Per Day/30 Days Discharge Instructions: Apply over primary dressing as directed. Secured  With: Insurance underwriter, Sterile 2x75 (in/in) 1 x Per Day/30 Days Discharge Instructions: Secure with stretch gauze as directed. Secured With: 11M Medipore H Soft Cloth Surgical T ape, 4 x 10 (in/yd) 1 x Per Day/30 Days Discharge Instructions: Secure with tape as directed. Electronic Signature(s) Signed: 04/26/2023 1:27:26 PM By: Kyle Guess MD FACS Entered By: Kyle Kyle [name] on 04/26/2023 10:23:52 -------------------------------------------------------------------------------- Problem List Details Patient Name: Date of Service: Kyle Face MA S Kyle [name]. 04/26/2023 12:30 PM Medical Record Number: [id number] Patient Account Number: [id number] Date of Birth/Sex: Treating RN: 23-Jul-1955 (67 y.o. 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 04/26/2023 12:30 PM Medical Record Number: [id number] Patient Account Number: [id number] Date of Birth/Sex: Treating RN: [birth year] (67 y.o. M) Primary Care Provider: Fara [name] Other Clinician: Referring Provider: Treating Provider/Extender: Quantay, Zaremba Kyle [name] ([id number]) [id number] Page 4 of 7 Weeks in Treatment: 2 Subjective Chief  Complaint Information obtained from Patient Patients presents for treatment of an open diabetic ulcer History of Present Illness (HPI) ADMISSION 04/10/2023 ***ABI NON-COMPRESSIBLE; STRONG DOPPLER SIGNALS*** This is Kyle [name] 67 year old diabetic (no recent hemoglobin A1c available for review) who suffered Kyle [name] syncopal event in early September. He apparently injured his left fourth toe as well as had pain in his ankle. The went to see his doctor about Kyle [name] week after his event due to continued pain in his ankle. Apparently his PCP was more concerned about his toe and sent him to the ED for further evaluation due to concern for possible osteomyelitis. Examination imaging done in the emergency department showed Kyle [name] comminuted fracture of the  middle phalanx of the fourth toe with minimal displacement. No other abnormalities were appreciated. He was given Kyle [name] course of cephalexin and referred to the wound care center for further evaluation and management. They did put him in Kyle [name] postop shoe for his toe and an Ace bandage for the ankle. He has not been wearing the postop shoe due to gait instability and has been applying peroxide and Neosporin to his toe wound. 04/26/2023: The depth of the wound has filled in completely with granulation tissue. There is no slough accumulation, no malodor or purulent drainage. Patient History Information obtained from Patient, Chart. Family History Cancer - Mother, Heart Disease - Father, Hypertension - Father, No family history of Diabetes, Hereditary Spherocytosis, Kidney Disease, Lung Disease, Seizures, Stroke, Thyroid Problems, Tuberculosis. Social History Former smoker - quit in 1987, Marital Status - Married, Alcohol Use - Rarely, Drug Use - No History, Caffeine Use - Daily. Medical History Respiratory Patient has history of Sleep Apnea Endocrine Patient has history of Type II Diabetes Musculoskeletal Patient has history of Osteomyelitis - of left  ulna Hospitalization/Surgery History - Olecranon bursectomy (Left). - Ulnar nerve transposition (Left). - tooth removal. - spinal stimulator implanted. - Back surgery. - Femur surgery. - Cholecystectomy. Medical Kyle [name] Surgical History Notes nd Constitutional Symptoms (General Health) MSSA infection Eyes Retinal vasculitis Ear/Nose/Mouth/Throat Dysphagia Gastrointestinal GERD Musculoskeletal Polyarthritis Neurologic Temporal arteritis, Occipital neuralgia of right side, NDPH (new daily persistent headache) Psychiatric depression Objective Constitutional Hypertensive, asymptomatic. no acute distress. Vitals Time Taken: 1:13 PM, Height: 73 in, Weight: 175 lbs, BMI: 23.1, Temperature: 97.5 F, Pulse: 60 bpm, Respiratory Rate: 18 breaths/min, Blood Pressure: 162/85 mmHg, Capillary Blood Glucose: 100 mg/dl. Respiratory Normal work of breathing on room air. General Notes: 04/26/2023: The depth of the wound has filled in completely with granulation tissue. There is no slough accumulation, no malodor or purulent drainage. Integumentary (Hair, Skin) Wound #1 status is Open. Original cause of wound was Trauma. The date acquired was: 02/28/2023. The wound has been in treatment 2 weeks. The wound is Kyle Kyle [name], Kyle Kyle [name] ([id number]) [id number] Page 5 of 7 located on the Right T Fourth. The wound measures 0.3cm length x 0.5cm width x 0.1cm depth; 0.118cm^2 area and 0.012cm^3 volume. There is bone and oe Fat Layer (Subcutaneous Tissue) exposed. There is no tunneling or undermining noted. There is Kyle [name] medium amount of serosanguineous drainage noted. The wound margin is distinct with the outline attached to the wound base. There is large (67-100%) red granulation within the wound bed. There is no necrotic tissue within the wound bed. The periwound skin appearance had no abnormalities noted for texture. The periwound skin appearance had no abnormalities noted for moisture. The  periwound skin appearance exhibited: Rubor. Periwound temperature was noted as No Abnormality. 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 - Dr. Lady [name] - room 1 Monday 10/21 @ 1:15 pm Anesthetic: (In clinic) Topical Lidocaine 5% applied to wound bed Bathing/ Shower/ Hygiene: May shower and wash wound with soap and water. Edema Control - Lymphedema / SCD / Other: Elevate legs to the level of the heart or above for 30 minutes daily and/or when sitting for 3-4 times Kyle [name] day throughout the day. Avoid standing for long periods of time. WOUND #1: - T Fourth Wound Laterality: Right oe Cleanser: Soap and Water 1 x Per Day/30 Days Discharge Instructions:  May shower and wash wound with dial antibacterial soap and water prior to dressing change. Cleanser: Wound Cleanser 1 x Per Day/30 Days Discharge Instructions: Cleanse the wound with wound cleanser prior to applying Kyle [name] clean dressing using gauze sponges, not tissue or cotton balls. Topical: Gentamicin 1 x Per Day/30 Days Discharge Instructions: As directed by physician Topical: Mupirocin Ointment 1 x Per Day/30 Days Discharge Instructions: Apply Mupirocin (Bactroban) as instructed Prim Dressing: Maxorb Extra Ag+ Alginate Dressing, 2x2 (in/in) 1 x Per Day/30 Days ary Discharge Instructions: Apply to wound bed as instructed Secondary Dressing: Woven Gauze Sponges 2x2 in 1 x Per Day/30 Days Discharge Instructions: Apply over primary dressing as directed. Secured With: Insurance underwriter, Sterile 2x75 (in/in) 1 x Per Day/30 Days Discharge Instructions: Secure with stretch gauze as directed. Secured With: 68M Medipore H Soft Cloth Surgical T ape, 4 x 10 (in/yd) 1 x Per Day/30 Days Discharge Instructions: Secure with tape as  directed. 04/26/2023: The depth of the wound has filled in completely with granulation tissue. There is no slough accumulation, no malodor or purulent drainage. No debridement was necessary today. We will continue to apply the mixture of topical gentamicin and mupirocin under silver alginate. Follow-up in 1 week. Electronic Signature(s) Signed: 04/26/2023 1:24:37 PM By: Kyle Guess MD FACS Entered By: Kyle Kyle [name] on 04/26/2023 10:24:37 -------------------------------------------------------------------------------- HxROS Details Patient Name: Date of Service: Kyle Face MA S Kyle [name]. 04/26/2023 12:30 PM Medical Record Number: [id number] Patient Account Number: [id number] Date of Birth/Sex: Treating RN: [birth year] (67 y.o. M) Primary Care Provider: Fara [name] Other Clinician: Referring Provider: Treating Provider/Extender: Kyle Kyle [name] in Treatment: 2 Information Obtained From Kyle Kyle [name], Kyle Kyle [name] ([id number]) [id number] Page 6 of 7 Patient Chart Constitutional Symptoms (General Health) Medical History: Past Medical History Notes: MSSA infection Eyes Medical History: Past Medical History Notes: Retinal vasculitis Ear/Nose/Mouth/Throat Medical History: Past Medical History Notes: Dysphagia Respiratory Medical History: Positive for: Sleep Apnea Gastrointestinal Medical History: Past Medical History Notes: GERD Endocrine Medical History: Positive for: Type II Diabetes Time with diabetes: 1994 Treated with: Insulin Blood sugar tested every day: Yes Tested : twice Kyle [name] day Musculoskeletal Medical History: Positive for: Osteomyelitis - of left ulna Past Medical History Notes: Polyarthritis Neurologic Medical History: Past Medical History Notes: Temporal arteritis, Occipital neuralgia of right side, NDPH (new daily persistent headache) Psychiatric Medical History: Past Medical History  Notes: depression Immunizations Pneumococcal Vaccine: Received Pneumococcal Vaccination: No Implantable Devices Yes Hospitalization / Surgery History Type of Hospitalization/Surgery Olecranon bursectomy (Left) Ulnar nerve transposition (Left) tooth removal spinal stimulator implanted Back surgery Femur surgery Cholecystectomy Family and Social History Cancer: Yes - Mother; Diabetes: No; Heart Disease: Yes - Father; Hereditary Spherocytosis: No; Hypertension: Yes - Father; Kidney Disease: No; Lung Disease: No; Seizures: No; Stroke: No; Thyroid Problems: No; Tuberculosis: No; Former smoker - quit in 1987; Marital Status - Married; Alcohol Use: Rarely; Kyle Kyle [name], Kyle Kyle [name] ([id number]) [id number] Page 7 of 7 Drug Use: No History; Caffeine Use: Daily; Financial Concerns: No; Food, Clothing or Shelter Needs: No; Support System Lacking: No; Transportation Concerns: No Psychologist, prison and probation services) Signed: 04/26/2023 1:27:26 PM By: Kyle Guess MD FACS Entered By: Kyle Kyle [name] on 04/26/2023 10:23:10 -------------------------------------------------------------------------------- SuperBill Details Patient Name: Date of Service: Kyle Face MA S Kyle [name]. 04/26/2023 Medical Record Number: [id number] Patient Account Number: [id number] Date of Birth/Sex: Treating RN: 1956/03/24 (67 y.o. 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

## 2023-04-26 NOTE — Progress Notes (Signed)
TIANDRE, TEALL A ([id number]) [id number] Page 1 of 8 Visit Report for 04/26/2023 Arrival Information Details Patient Name: Date of Service: Kyle [name] [place] A. 04/26/2023 12:30 PM Medical Record Number: [id number] Patient Account Number: [id number] Date of Birth/Sex: Treating RN: 1955/09/25 (67 y.o. [name] Primary Care Alegra Rost: Fara Chute Other Clinician: Referring Ronrico Dupin: Treating Teyana Pierron/Extender: Garret Reddish in Treatment: 2 Visit Information History Since Last Visit Added or deleted any medications: No Patient Arrived: [place] Mor Any new allergies or adverse reactions: No Arrival Time: 13:08 Had a fall or experienced change in No Accompanied By: self activities of daily living that may affect Transfer Assistance: None risk of falls: Patient Identification Verified: Yes Signs or symptoms of abuse/neglect since last visito No Secondary Verification Process Completed: Yes Hospitalized since last visit: No Patient Requires Transmission-Based Precautions: No Implantable device outside of the clinic excluding No Patient Has Alerts: Yes cellular tissue based products placed in the center Patient Alerts: R ABI: [place] in clinic since last visit: Has Dressing in Place as Prescribed: Yes Pain Present Now: No Electronic Signature(s) Signed: 04/26/2023 3:36:53 PM By: Zenaida Deed RN, BSN Entered By: Zenaida Deed on 04/26/2023 10:08:43 -------------------------------------------------------------------------------- Clinic Level of Care Assessment Details Patient Name: Date of Service: Kyle Face MA S A. 04/26/2023 12:30 PM Medical Record Number: [id number] Patient Account Number: [id number] Date of Birth/Sex: Treating RN: [birth year] (67 y.o. [name] Primary Care Dejha King: Fara Chute Other Clinician: Referring Georgeanne Frankland: Treating Monice Lundy/Extender: Garret Reddish in Treatment:  2 Clinic Level of Care Assessment Items TOOL 4 Quantity Score []  - 0 Use when only an EandM is performed on FOLLOW-UP visit ASSESSMENTS - Nursing Assessment / Reassessment X- 1 10 Reassessment of Co-morbidities (includes updates in patient status) X- 1 5 Reassessment of Adherence to Treatment Plan ASSESSMENTS - Wound and Skin A ssessment / Reassessment X - Simple Wound Assessment / Reassessment - one wound 1 5 []  - 0 Complex Wound Assessment / Reassessment - multiple wounds []  - 0 Dermatologic / Skin Assessment (not related to wound area) ASSESSMENTS - Focused Assessment []  - 0 Circumferential Edema Measurements - multi extremities []  - 0 Nutritional Assessment / Counseling / Intervention CRISTIAN, DAVITT A ([id number]) [id number] Page 2 of 8 X- 1 5 Lower Extremity Assessment (monofilament, tuning fork, pulses) []  - 0 Peripheral Arterial Disease Assessment (using hand held doppler) ASSESSMENTS - Ostomy and/or Continence Assessment and Care []  - 0 Incontinence Assessment and Management []  - 0 Ostomy Care Assessment and Management (repouching, etc.) PROCESS - Coordination of Care X - Simple Patient / Family Education for ongoing care 1 15 []  - 0 Complex (extensive) Patient / Family Education for ongoing care X- 1 10 Staff obtains Chiropractor, Records, T Results / Process Orders est []  - 0 Staff telephones HHA, Nursing Homes / Clarify orders / etc []  - 0 Routine Transfer to another Facility (non-emergent condition) []  - 0 Routine Hospital Admission (non-emergent condition) []  - 0 New Admissions / Manufacturing engineer / Ordering NPWT Apligraf, etc. , []  - 0 Emergency Hospital Admission (emergent condition) X- 1 10 Simple Discharge Coordination []  - 0 Complex (extensive) Discharge Coordination PROCESS - Special Needs []  - 0 Pediatric / Minor Patient Management []  - 0 Isolation Patient Management []  - 0 Hearing / Language / Visual special  needs []  - 0 Assessment of Community assistance (transportation, D/C planning, etc.) []  - 0 Additional assistance / Altered mentation []  - 0 Support Surface(s) Assessment (bed, cushion, seat, etc.)  INTERVENTIONS - Wound Cleansing / Measurement X - Simple Wound Cleansing - one wound 1 5 []  - 0 Complex Wound Cleansing - multiple wounds X- 1 5 Wound Imaging (photographs - any number of wounds) []  - 0 Wound Tracing (instead of photographs) X- 1 5 Simple Wound Measurement - one wound []  - 0 Complex Wound Measurement - multiple wounds INTERVENTIONS - Wound Dressings X - Small Wound Dressing one or multiple wounds 1 10 []  - 0 Medium Wound Dressing one or multiple wounds []  - 0 Large Wound Dressing one or multiple wounds X- 1 5 Application of Medications - topical []  - 0 Application of Medications - injection INTERVENTIONS - Miscellaneous []  - 0 External ear exam []  - 0 Specimen Collection (cultures, biopsies, blood, body fluids, etc.) []  - 0 Specimen(s) / Culture(s) sent or taken to Lab for analysis []  - 0 Patient Transfer (multiple staff / Nurse, adult / Similar devices) []  - 0 Simple Staple / Suture removal (25 or less) []  - 0 Complex Staple / Suture removal (26 or more) []  - 0 Hypo / Hyperglycemic Management (close monitor of Blood Glucose) SHAYDEN, BOBIER A ([id number]) [id number] Page 3 of 8 []  - 0 Ankle / Brachial Index (ABI) - do not check if billed separately X- 1 5 Vital Signs Has the patient been seen at the hospital within the last three years: Yes Total Score: 95 Level Of Care: New/Established - Level 3 Electronic Signature(s) Signed: 04/26/2023 3:36:53 PM By: Zenaida Deed RN, BSN Entered By: Zenaida Deed on 04/26/2023 10:28:48 -------------------------------------------------------------------------------- Lower Extremity Assessment Details Patient Name: Date of Service: Kyle Face MA S A. 04/26/2023 12:30 PM Medical  Record Number: [id number] Patient Account Number: [id number] Date of Birth/Sex: Treating RN: Dec 13, 1955 (67 y.o. [name] Primary Care Tyreka Henneke: Fara Chute Other Clinician: Referring Christorpher Hisaw: Treating Lucette Kratz/Extender: Garret Reddish in Treatment: 2 Edema Assessment Assessed: [Left: No] [Right: No] Edema: [Left: N] [Right: o] Calf Left: Right: Point of Measurement: From Medial Instep 34 cm Ankle Left: Right: Point of Measurement: From Medial Instep 20 cm Vascular Assessment Pulses: Dorsalis Pedis Palpable: [Right:Yes] Extremity colors, hair growth, and conditions: Extremity Color: [Right:Normal] Hair Growth on Extremity: [Right:No] Temperature of Extremity: [Right:Warm] Capillary Refill: [Right:< 3 seconds] Dependent Rubor: [Right:No No] Electronic Signature(s) Signed: 04/26/2023 3:36:53 PM By: Zenaida Deed RN, BSN Entered By: Zenaida Deed on 04/26/2023 10:09:19 -------------------------------------------------------------------------------- Multi Wound Chart Details Patient Name: Date of Service: Kyle Face MA S A. 04/26/2023 12:30 PM Medical Record Number: [id number] Patient Account Number: [id number] Date of Birth/Sex: Treating RN: 20-Nov-1955 (67 y.o. M) Primary Care Evanell Redlich: Fara Chute Other Clinician: Referring Areliz Rothman: Treating Caylea Foronda/Extender: Garret Reddish in Treatment: 2 OLDEN, KLAUER A ([id number]) [id number] Page 4 of 8 Vital Signs Height(in): 73 Capillary Blood Glucose(mg/dl): 595 Weight(lbs): 638 Pulse(bpm): 60 Body Mass Index(BMI): 23.1 Blood Pressure(mmHg): 162/85 Temperature(F): 97.5 Respiratory Rate(breaths/min): 18 [1:Photos:] [N/A:N/A] Right T Fourth oe N/A N/A Wound Location: Trauma N/A N/A Wounding Event: Diabetic Wound/Ulcer of the Lower N/A N/A Primary Etiology: Extremity Sleep Apnea, Type II Diabetes, N/A N/A Comorbid  History: Osteomyelitis 02/28/2023 N/A N/A Date Acquired: 2 N/A N/A Weeks of Treatment: Open N/A N/A Wound Status: No N/A N/A Wound Recurrence: 0.3x0.5x0.1 N/A N/A Measurements L x W x D (cm) 0.118 N/A N/A A (cm) : rea 0.012 N/A N/A Volume (cm) : 46.40% N/A N/A % Reduction in A rea: 81.80% N/A N/A % Reduction in Volume: Grade 2 N/A N/A Classification: Medium N/A N/A Exudate  A mount: Serosanguineous N/A N/A Exudate Type: red, brown N/A N/A Exudate Color: Distinct, outline attached N/A N/A Wound Margin: Large (67-100%) N/A N/A Granulation A mount: Red N/A N/A Granulation Quality: None Present (0%) N/A N/A Necrotic A mount: Fat Layer (Subcutaneous Tissue): Yes N/A N/A Exposed Structures: Bone: Yes Fascia: No Tendon: No Muscle: No Joint: No Small (1-33%) N/A N/A Epithelialization: No Abnormalities Noted N/A N/A Periwound Skin Texture: Maceration: No N/A N/A Periwound Skin Moisture: Rubor: Yes N/A N/A Periwound Skin Color: No Abnormality N/A N/A Temperature: Treatment Notes Electronic Signature(s) Signed: 04/26/2023 1:21:20 PM By: Duanne Guess MD FACS Entered By: Duanne Guess on 04/26/2023 10:21:20 -------------------------------------------------------------------------------- Multi-Disciplinary Care Plan Details Patient Name: Date of Service: Kyle Face MA S A. 04/26/2023 12:30 PM Medical Record Number: [id number] Patient Account Number: [id number] Date of Birth/Sex: Treating RN: [birth year] (67 y.o. [name] Primary Care Kolbi Altadonna: Fara Chute Other Clinician: Referring Cyriah Childrey: Treating Shaquayla Klimas/Extender: Garret Reddish in Treatment: 2 TRAVERS, GOODLEY A ([id number]) [id number] Page 5 of 8 Multidisciplinary Care Plan reviewed with physician Active Inactive Abuse / Safety / Falls / Self Care Management Nursing Diagnoses: Impaired physical mobility Knowledge deficit related to:  safety; personal, health (wound), emergency Potential for falls Goals: Patient/caregiver will verbalize/demonstrate measures taken to improve the patient's personal safety Date Initiated: 04/10/2023 Target Resolution Date: 05/25/2023 Goal Status: Active Patient/caregiver will verbalize/demonstrate measures taken to prevent injury and/or falls Date Initiated: 04/10/2023 Target Resolution Date: 05/25/2023 Goal Status: Active Interventions: Assess fall risk on admission and as needed Assess impairment of mobility on admission and as needed per policy Notes: Wound/Skin Impairment Nursing Diagnoses: Impaired tissue integrity Knowledge deficit related to ulceration/compromised skin integrity Goals: Patient/caregiver will verbalize understanding of skin care regimen Date Initiated: 04/10/2023 Target Resolution Date: 05/25/2023 Goal Status: Active Interventions: Assess patient/caregiver ability to obtain necessary supplies Assess patient/caregiver ability to perform ulcer/skin care regimen upon admission and as needed Assess ulceration(s) every visit Treatment Activities: Referred to DME Uvaldo Rybacki for dressing supplies : 04/10/2023 Skin care regimen initiated : 04/10/2023 Topical wound management initiated : 04/10/2023 Notes: Electronic Signature(s) Signed: 04/26/2023 3:36:53 PM By: Zenaida Deed RN, BSN Entered By: Zenaida Deed on 04/26/2023 10:14:36 -------------------------------------------------------------------------------- Pain Assessment Details Patient Name: Date of Service: Kyle Face MA S A. 04/26/2023 12:30 PM Medical Record Number: [id number] Patient Account Number: [id number] Date of Birth/Sex: Treating RN: [birth year] (67 y.o. [name] Primary Care Andrius Andrepont: Fara Chute Other Clinician: Referring Rosbel Buckner: Treating Ynez Eugenio/Extender: Garret Reddish in Treatment: 2 Active Problems Location of Pain Severity and Description of  Pain Patient Has Paino No Site Locations Rate the pain. KYNG, MATLOCK A ([id number]) [id number] Page 6 of 8 Rate the pain. Current Pain Level: 0 Pain Management and Medication Current Pain Management: Electronic Signature(s) Signed: 04/26/2023 3:36:53 PM By: Zenaida Deed RN, BSN Entered By: Zenaida Deed on 04/26/2023 10:08:56 -------------------------------------------------------------------------------- Patient/Caregiver Education Details Patient Name: Date of Service: Kyle Face MA S A. 10/10/2024andnbsp12:30 PM Medical Record Number: [id number] Patient Account Number: [id number] Date of Birth/Gender: Treating RN: Dec 04, 1955 (67 y.o. [name] Primary Care Physician: Fara Chute Other Clinician: Referring Physician: Treating Physician/Extender: Garret Reddish in Treatment: 2 Education Assessment Education Provided To: Patient Education Topics Provided Elevated Blood Sugar/ Impact on Healing: Methods: Explain/Verbal Responses: Reinforcements needed, State content correctly Wound/Skin Impairment: Methods: Explain/Verbal Responses: Reinforcements needed, State content correctly Electronic Signature(s) Signed: 04/26/2023 3:36:53 PM By: Zenaida Deed RN, BSN Entered By: Zenaida Deed on 04/26/2023 10:15:01 -------------------------------------------------------------------------------- Wound  Assessment Details Patient Name: Date of Service: Kyle [name] [place] S A. 04/26/2023 12:30 PM TYRESSE, JAYSON A ([id number]) [id number] Page 7 of 8 Medical Record Number: [id number] Patient Account Number: [id number] Date of Birth/Sex: Treating RN: [birth year] (67 y.o. [name] Primary Care Jerrie Schussler: Fara Chute Other Clinician: Referring Rien Marland: Treating Sian Joles/Extender: Garret Reddish in Treatment: 2 Wound Status Wound Number: 1 Primary Etiology:  Diabetic Wound/Ulcer of the Lower Extremity Wound Location: Right T Fourth oe Wound Status: Open Wounding Event: Trauma Comorbid History: Sleep Apnea, Type II Diabetes, Osteomyelitis Date Acquired: 02/28/2023 Weeks Of Treatment: 2 Clustered Wound: No Photos Wound Measurements Length: (cm) 0.3 Width: (cm) 0.5 Depth: (cm) 0.1 Area: (cm) 0.118 Volume: (cm) 0.012 % Reduction in Area: 46.4% % Reduction in Volume: 81.8% Epithelialization: Small (1-33%) Tunneling: No Undermining: No Wound Description Classification: Grade 2 Wound Margin: Distinct, outline attached Exudate Amount: Medium Exudate Type: Serosanguineous Exudate Color: red, brown Foul Odor After Cleansing: No Slough/Fibrino Yes Wound Bed Granulation Amount: Large (67-100%) Exposed Structure Granulation Quality: Red Fascia Exposed: No Necrotic Amount: None Present (0%) Fat Layer (Subcutaneous Tissue) Exposed: Yes Tendon Exposed: No Muscle Exposed: No Joint Exposed: No Bone Exposed: Yes Periwound Skin Texture Texture Color No Abnormalities Noted: Yes No Abnormalities Noted: No Rubor: Yes Moisture No Abnormalities Noted: Yes Temperature / Pain Temperature: No Abnormality Electronic Signature(s) Signed: 04/26/2023 3:36:53 PM By: Zenaida Deed RN, BSN Entered By: Zenaida Deed on 04/26/2023 10:11:07 -------------------------------------------------------------------------------- Vitals Details Patient Name: Date of Service: Kyle Face MA S A. 04/26/2023 12:30 PM Lafe Garin ([id number]) [id number] Page 8 of 8 Medical Record Number: [id number] Patient Account Number: [id number] Date of Birth/Sex: Treating RN: 1956-05-11 (67 y.o. [name] Primary Care Caden Fatica: Fara Chute Other Clinician: Referring Moosa Bueche: Treating Drisana Schweickert/Extender: Garret Reddish in Treatment: 2 Vital Signs Time Taken: 13:13 Temperature (F): 97.5 Height (in):  73 Pulse (bpm): 60 Weight (lbs): 175 Respiratory Rate (breaths/min): 18 Body Mass Index (BMI): 23.1 Blood Pressure (mmHg): 162/85 Capillary Blood Glucose (mg/dl): 606 Reference Range: 80 - 120 mg / dl Electronic Signature(s) Signed: 04/26/2023 3:36:53 PM By: Zenaida Deed RN, BSN Entered By: Zenaida Deed on 04/26/2023 10:13:56

## 2023-05-07 ENCOUNTER — Encounter (HOSPITAL_BASED_OUTPATIENT_CLINIC_OR_DEPARTMENT_OTHER): Payer: Medicare PPO | Admitting: General Surgery

## 2023-05-07 DIAGNOSIS — G4730 Sleep apnea, unspecified: Secondary | ICD-10-CM | POA: Diagnosis not present

## 2023-05-07 DIAGNOSIS — S99922A Unspecified injury of left foot, initial encounter: Secondary | ICD-10-CM | POA: Diagnosis not present

## 2023-05-07 DIAGNOSIS — K219 Gastro-esophageal reflux disease without esophagitis: Secondary | ICD-10-CM | POA: Diagnosis not present

## 2023-05-07 DIAGNOSIS — E11621 Type 2 diabetes mellitus with foot ulcer: Secondary | ICD-10-CM | POA: Diagnosis not present

## 2023-05-07 DIAGNOSIS — I10 Essential (primary) hypertension: Secondary | ICD-10-CM | POA: Diagnosis not present

## 2023-05-07 DIAGNOSIS — E11622 Type 2 diabetes mellitus with other skin ulcer: Secondary | ICD-10-CM | POA: Diagnosis not present

## 2023-05-07 DIAGNOSIS — L97516 Non-pressure chronic ulcer of other part of right foot with bone involvement without evidence of necrosis: Secondary | ICD-10-CM | POA: Diagnosis not present

## 2023-05-07 NOTE — Progress Notes (Signed)
GRADY, STROHSCHEIN A ([id number]) [id number] Page 1 of 8 Visit Report for 05/07/2023 Chief Complaint Document Details Patient Name: Date of Service: Kyle [name] [place] A. 05/07/2023 1:15 PM Medical Record Number: [id number] Patient Account Number: [id number] Date of Birth/Sex: Treating RN: Dec 08, 1955 (67 y.o. M) Primary Care Provider: Fara Chute Other Clinician: Referring Provider: Treating Provider/Extender: Garret Reddish in Treatment: 3 Information Obtained from: Patient Chief Complaint Patients presents for treatment of an open diabetic ulcer Electronic Signature(s) Signed: 05/07/2023 1:44:17 PM By: Duanne Guess MD FACS Entered By: Duanne Guess on 05/07/2023 10:44:17 -------------------------------------------------------------------------------- Debridement Details Patient Name: Date of Service: Kyle Face MA S A. 05/07/2023 1:15 PM Medical Record Number: [id number] Patient Account Number: [id number] Date of Birth/Sex: Treating RN: 02/22/1956 (67 y.o. [name] Primary Care Provider: Fara Chute Other Clinician: Referring Provider: Treating Provider/Extender: Garret Reddish in Treatment: 3 Debridement Performed for Assessment: Wound #1 Right T Fourth oe Performed By: Physician Duanne Guess, MD The following information was scribed by: Zenaida Deed The information was scribed for: Duanne Guess Debridement Type: Debridement Severity of Tissue Pre Debridement: Bone involvement without necrosis Level of Consciousness (Pre-procedure): Awake and Alert Pre-procedure Verification/Time Out Yes - 13:20 Taken: Start Time: 13:21 Pain Control: Lidocaine 4% T opical Solution Percent of Wound Bed Debrided: 100% T Area Debrided (cm): otal 0.06 Tissue and other material debrided: Non-Viable, Slough, Skin: Epidermis, Slough Level: Skin/Epidermis Debridement Description: Selective/Open  Wound Instrument: Curette Bleeding: Minimum Hemostasis Achieved: Pressure Procedural Pain: 0 Post Procedural Pain: 0 Response to Treatment: Procedure was tolerated well Level of Consciousness (Post- Awake and Alert procedure): Post Debridement Measurements of Total Wound Length: (cm) 0.2 Width: (cm) 0.4 Depth: (cm) 0.1 Volume: (cm) 0.006 Mehlberg, Dante A ([id number]) [id number] Page 2 of 8 Character of Wound/Ulcer Post Debridement: Stable Severity of Tissue Post Debridement: Bone involvement without necrosis Post Procedure Diagnosis Same as Pre-procedure Electronic Signature(s) Signed: 05/07/2023 2:09:05 PM By: Duanne Guess MD FACS Signed: 05/07/2023 5:25:08 PM By: Zenaida Deed RN, BSN Entered By: Zenaida Deed on 05/07/2023 10:23:55 -------------------------------------------------------------------------------- HPI Details Patient Name: Date of Service: Kyle Face MA S A. 05/07/2023 1:15 PM Medical Record Number: [id number] Patient Account Number: [id number] Date of Birth/Sex: Treating RN: 1955/08/17 (67 y.o. M) Primary Care Provider: Fara Chute Other Clinician: Referring Provider: Treating Provider/Extender: Garret Reddish in Treatment: 3 History of Present Illness HPI Description: ADMISSION 04/10/2023 ***ABI NON-COMPRESSIBLE; STRONG DOPPLER SIGNALS*** This is a 67 year old diabetic (no recent hemoglobin A1c available for review) who suffered a syncopal event in early September. He apparently injured his left fourth toe as well as had pain in his ankle. The went to see his doctor about a week after his event due to continued pain in his ankle. Apparently his PCP was more concerned about his toe and sent him to the ED for further evaluation due to concern for possible osteomyelitis. Examination imaging done in the emergency department showed a comminuted fracture of the middle phalanx of the fourth toe with  minimal displacement. No other abnormalities were appreciated. He was given a course of cephalexin and referred to the wound care center for further evaluation and management. They did put him in a postop shoe for his toe and an Ace bandage for the ankle. He has not been wearing the postop shoe due to gait instability and has been applying peroxide and Neosporin to his toe wound. 04/26/2023: The depth of the wound has filled in completely with granulation tissue.  There is no slough accumulation, no malodor or purulent drainage. 05/07/2023: There is granulation tissue filling the wound, but when a probe is inserted, bone is still appreciated. There is a little bit of slough accumulation. Electronic Signature(s) Signed: 05/07/2023 1:45:11 PM By: Duanne Guess MD FACS Entered By: Duanne Guess on 05/07/2023 10:45:10 -------------------------------------------------------------------------------- Physical Exam Details Patient Name: Date of Service: Kyle Face MA S A. 05/07/2023 1:15 PM Medical Record Number: [id number] Patient Account Number: [id number] Date of Birth/Sex: Treating RN: Nov 20, 1955 (67 y.o. M) Primary Care Provider: Fara Chute Other Clinician: Referring Provider: Treating Provider/Extender: Garret Reddish in Treatment: 3 Constitutional Hypertensive, asymptomatic. . . . no acute distress. Respiratory Normal work of breathing on room air. Notes RIGSBY, PEREGRINO ([id number]) [id number] Page 3 of 8 05/07/2023: There is granulation tissue filling the wound, but when a probe is inserted, bone is still appreciated. There is a little bit of slough accumulation. Electronic Signature(s) Signed: 05/07/2023 1:46:44 PM By: Duanne Guess MD FACS Entered By: Duanne Guess on 05/07/2023 10:46:44 -------------------------------------------------------------------------------- Physician Orders Details Patient Name: Date of  Service: Kyle Face MA S A. 05/07/2023 1:15 PM Medical Record Number: [id number] Patient Account Number: [id number] Date of Birth/Sex: Treating RN: 18-Aug-1955 (67 y.o. 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 - Dr. Lady Gary - room 1 Return A Friday 11/1 @ 10:15 am Anesthetic (In clinic) Topical Lidocaine 5% applied to wound bed Cellular or Tissue Based Products Cellular or Tissue Based Product Type: - run IVR for grafix Bathing/ Shower/ Hygiene May shower and wash wound with soap and water. Edema Control - Lymphedema / SCD / Other Elevate legs to the level of the heart or above for 30 minutes daily and/or when sitting for 3-4 times a day throughout the day. Avoid standing for long periods of time. Wound Treatment Wound #1 - T Fourth oe Wound Laterality: Right Cleanser: Soap and Water 1 x Per Day/30 Days Discharge Instructions: May shower and wash wound with dial antibacterial soap and water prior to dressing change. Cleanser: Wound Cleanser 1 x Per Day/30 Days Discharge Instructions: Cleanse the wound with wound cleanser prior to applying a clean dressing using gauze sponges, not tissue or cotton balls. Topical: Gentamicin 1 x Per Day/30 Days Discharge Instructions: As directed by physician Topical: Mupirocin Ointment 1 x Per Day/30 Days Discharge Instructions: Apply Mupirocin (Bactroban) as instructed Prim Dressing: Maxorb Extra Ag+ Alginate Dressing, 2x2 (in/in)  1 x Per Day/30 Days ary Discharge Instructions: Apply to wound bed as instructed Secondary Dressing: Woven Gauze Sponges 2x2 in 1 x Per Day/30 Days Discharge Instructions: Apply over primary dressing as directed. Secured With: Insurance underwriter, Sterile 2x75 (in/in) 1 x Per Day/30 Days Discharge Instructions: Secure with stretch gauze as directed. Secured With: 62M Medipore H Soft Cloth Surgical T ape, 4 x 10 (in/yd) 1 x Per Day/30 Days Discharge Instructions: Secure with tape as directed. HIXON, SANDO A ([id number]) [id number] Page 4 of 8 Electronic Signature(s) Signed: 05/07/2023 2:09:05 PM By: Duanne Guess MD FACS Entered By: Duanne Guess on 05/07/2023 10:48:23 -------------------------------------------------------------------------------- Problem  List Details Patient Name: Date of Service: Kyle [name] [place] A. 05/07/2023 1:15 PM Medical Record Number: [id number] Patient Account Number: [id number] Date of Birth/Sex: Treating RN: 16-Aug-1955 (67 y.o. 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 05/07/2023 1:15 PM Medical Record Number: [id number] Patient Account Number: [id number] Date of Birth/Sex: Treating  RN: 1956/05/08 (67 y.o. M) Primary Care Provider: Fara Chute Other Clinician: Referring Provider: Treating Provider/Extender: Garret Reddish in Treatment: 3 Subjective Chief Complaint Information obtained from Patient Patients presents for treatment of an open diabetic ulcer History of Present Illness (HPI) ADMISSION 04/10/2023 ***ABI NON-COMPRESSIBLE; STRONG DOPPLER SIGNALS*** FERNADO, ZALDIVAR A ([id number]) [id number] Page 5 of 8 This is a 67 year old diabetic (no recent hemoglobin A1c available for review) who suffered a syncopal event in early September. He apparently injured his left fourth toe as well as had pain in his ankle. The went to see his doctor about a week after his event due to continued pain in his ankle. Apparently his PCP was more concerned about his toe and sent him to the ED for further evaluation due to concern for possible osteomyelitis. Examination imaging done in the emergency department showed a comminuted fracture of the middle phalanx of the fourth toe with minimal displacement. No other abnormalities were appreciated. He was given a course of cephalexin and referred to the wound care center for further evaluation and management. They did put him in a postop shoe for his toe and an Ace bandage for the ankle. He has not been wearing the postop shoe due to gait instability and has been applying peroxide and Neosporin to his toe wound. 04/26/2023: The depth of the wound has filled in completely with granulation tissue. There is no slough accumulation, no malodor or purulent drainage. 05/07/2023: There is granulation tissue filling the wound, but when a probe is inserted, bone is still appreciated. There is a little bit of slough accumulation. Patient History Information obtained from Patient, Chart. Family History Cancer - Mother, Heart Disease - Father, Hypertension - Father, No family history of Diabetes,  Hereditary Spherocytosis, Kidney Disease, Lung Disease, Seizures, Stroke, Thyroid Problems, Tuberculosis. Social History Former smoker - quit in 1987, Marital Status - Married, Alcohol Use - Rarely, Drug Use - No History, Caffeine Use - Daily. Medical History Respiratory Patient has history of Sleep Apnea Endocrine Patient has history of Type II Diabetes Musculoskeletal Patient has history of Osteomyelitis - of left ulna Hospitalization/Surgery History - Olecranon bursectomy (Left). - Ulnar nerve transposition (Left). - tooth removal. - spinal stimulator implanted. - Back surgery. - Femur surgery. - Cholecystectomy. Medical A Surgical History Notes nd Constitutional Symptoms (General Health) MSSA infection Eyes Retinal vasculitis Ear/Nose/Mouth/Throat Dysphagia Gastrointestinal GERD Musculoskeletal Polyarthritis Neurologic Temporal arteritis, Occipital neuralgia of right side, NDPH (new daily persistent headache) Psychiatric depression Objective Constitutional Hypertensive, asymptomatic. no acute distress. Vitals Time Taken: 12:58 PM, Height: 73 in, Weight: 175 lbs, BMI: 23.1, Temperature: 97.7 F, Pulse: 63 bpm, Respiratory  Rate: 18 breaths/min, Blood Pressure: 163/89 mmHg, Capillary Blood Glucose: 112 mg/dl. General Notes: glucose per pt report this am Respiratory Normal work of breathing on room air. General Notes: 05/07/2023: There is granulation tissue filling the wound, but when a probe is inserted, bone is still appreciated. There is a little bit of slough accumulation. Integumentary (Hair, Skin) Wound #1 status is Open. Original cause of wound was Trauma. The date acquired was: 02/28/2023. The wound has been in treatment 3 weeks. The wound is located on the Right T Fourth. The wound measures 0.2cm length x 0.4cm width x 0.1cm depth; 0.063cm^2 area and 0.006cm^3 volume. There is bone and oe Fat Layer (Subcutaneous Tissue) exposed. There is no tunneling or undermining  noted. There is a small amount of serous drainage noted. The wound margin is distinct with the outline attached to the wound base. There is small (1-33%) red granulation within the wound bed. There is a small (1-33%) amount of necrotic tissue within the wound bed including Adherent Slough. The periwound skin appearance had no abnormalities noted for texture. The periwound skin appearance had no abnormalities noted for moisture. The periwound skin appearance exhibited: Rubor. Periwound temperature was noted as No Abnormality. 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 There was a Selective/Open Wound Skin/Epidermis Debridement with a total area of 0.06 sq cm performed by Duanne Guess, MD. With the following instrument(s): Curette to remove Non-Viable tissue/material. Material removed includes [facility] and Skin: Epidermis and after achieving pain control using Lidocaine 4% Topical Solution. No specimens were taken. A time out was conducted at 13:20, prior to the start of the procedure. A Minimum amount of bleeding was controlled with Pressure. The procedure was tolerated well with a pain level of 0 throughout and a pain level of 0 following the procedure. Post Debridement Measurements: 0.2cm length x 0.4cm width x 0.1cm depth; 0.006cm^3 volume. Character of Wound/Ulcer Post Debridement is stable. Severity of Tissue Post Debridement is: Bone involvement without necrosis. Post procedure Diagnosis Wound #1: Same as Pre-Procedure Plan Follow-up Appointments: Return  Appointment in 1 week. - Dr. Lady Gary - room 1 Friday 11/1 @ 10:15 am Anesthetic: (In clinic) Topical Lidocaine 5% applied to wound bed Cellular or Tissue Based Products: Cellular or Tissue Based Product Type: - run IVR for grafix Bathing/ Shower/ Hygiene: May shower and wash wound with soap and water. Edema Control - Lymphedema / SCD / Other: Elevate legs to the level of the heart or above for 30 minutes daily and/or when sitting for 3-4 times a day throughout the day. Avoid standing for long periods of time. WOUND #1: - T Fourth Wound Laterality: Right oe Cleanser: Soap and Water 1 x Per Day/30 Days Discharge Instructions: May shower and wash wound with dial antibacterial soap and water prior to dressing change. Cleanser: Wound Cleanser 1 x Per Day/30 Days Discharge Instructions: Cleanse the wound with wound cleanser prior to applying a clean dressing using gauze sponges, not tissue or cotton balls. Topical: Gentamicin 1 x Per Day/30 Days Discharge Instructions: As directed by physician Topical: Mupirocin Ointment 1 x Per Day/30 Days Discharge Instructions: Apply Mupirocin (Bactroban) as instructed Prim  Dressing: Maxorb Extra Ag+ Alginate Dressing, 2x2 (in/in) 1 x Per Day/30 Days ary Discharge Instructions: Apply to wound bed as instructed Secondary Dressing: Woven Gauze Sponges 2x2 in 1 x Per Day/30 Days Discharge Instructions: Apply over primary dressing as directed. Secured With: Insurance underwriter, Sterile 2x75 (in/in) 1 x Per Day/30 Days Discharge Instructions: Secure with stretch gauze as directed. Secured With: 56M Medipore H Soft Cloth Surgical T ape, 4 x 10 (in/yd) 1 x Per Day/30 Days Discharge Instructions: Secure with tape as directed. 05/07/2023: There is granulation tissue filling the wound, but when a probe is inserted, bone is still appreciated. There is a little bit of slough accumulation. I used a curette to debride slough and skin from the wound.  Unfortunately, the opening is so small that I cannot get in with a rongeur to remove the exposed piece of bone. We will run his insurance to see if Grafix or something similar might be approved to try and get better tissue coverage. Continue topical gentamicin with mupirocin and silver alginate for now. Follow-up in 1 week. Electronic Signature(s) Signed: 05/07/2023 1:51:24 PM By: Duanne Guess MD FACS Entered By: Duanne Guess on 05/07/2023 10:51:24 -------------------------------------------------------------------------------- HxROS Details Patient Name: Date of Service: Enriqueta Shutter, Barkley Boards MA S A. 05/07/2023 1:15 PM BROEDY, MOGG A ([id number]) [id number] Page 7 of 8 Medical Record Number: [id number] Patient Account Number: [id number] Date of Birth/Sex: Treating RN: Apr 23, 1956 (67 y.o. M) Primary Care Provider: Fara Chute Other Clinician: Referring Provider: Treating Provider/Extender: Garret Reddish in Treatment: 3 Information Obtained From Patient Chart Constitutional Symptoms (General Health) Medical History: Past Medical History Notes: MSSA infection Eyes Medical History: Past Medical History Notes: Retinal vasculitis Ear/Nose/Mouth/Throat Medical History: Past Medical History Notes: Dysphagia Respiratory Medical History: Positive for: Sleep Apnea Gastrointestinal Medical History: Past Medical History Notes: GERD Endocrine Medical History: Positive for: Type II Diabetes Time with diabetes: 1994 Treated with: Insulin Blood sugar tested every day: Yes Tested : twice a day Musculoskeletal Medical History: Positive for: Osteomyelitis - of left ulna Past Medical History Notes: Polyarthritis Neurologic Medical History: Past Medical History Notes: Temporal arteritis, Occipital neuralgia of right side, NDPH (new daily persistent headache) Psychiatric Medical History: Past Medical History  Notes: depression Immunizations Pneumococcal Vaccine: Received Pneumococcal Vaccination: No Implantable Devices Yes Hospitalization / Surgery History Type of Hospitalization/Surgery Olecranon bursectomy (Left) Ulnar nerve transposition (Left) tooth removal KEELER, MCWAIN A ([id number]) [id number] Page 8 of 8 spinal stimulator implanted Back surgery Femur surgery Cholecystectomy Family and Social History Cancer: Yes - Mother; Diabetes: No; Heart Disease: Yes - Father; Hereditary Spherocytosis: No; Hypertension: Yes - Father; Kidney Disease: No; Lung Disease: No; Seizures: No; Stroke: No; Thyroid Problems: No; Tuberculosis: No; Former smoker - quit in 1987; Marital Status - Married; Alcohol Use: Rarely; Drug Use: No History; Caffeine Use: Daily; Financial Concerns: No; Food, Clothing or Shelter Needs: No; Support System Lacking: No; Transportation Concerns: No Electronic Signature(s) Signed: 05/07/2023 2:09:05 PM By: Duanne Guess MD FACS Entered By: Duanne Guess on 05/07/2023 10:45:23 -------------------------------------------------------------------------------- SuperBill Details Patient Name: Date of Service: Kyle Face MA S A. 05/07/2023 Medical Record Number: [id number] Patient Account Number: [id number] Date of Birth/Sex: Treating RN: 03/18/1956 (67 y.o. 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  with Modifier: out evidence of nec Quantity: 1 rosis Physician Procedures : CPT4 Code Description Modifier [id number] - WC PHYS LEVEL 4 - EST PT 25 ICD-10 Diagnosis Description 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

## 2023-05-07 NOTE — Progress Notes (Signed)
Kyle [name], Kyle [name] ([id number]) [id number] Page 1 of 8 Visit Report for 05/07/2023 Arrival Information Details Patient Name: Date of Service: Kyle [name] [place] [name]. 05/07/2023 1:15 PM Medical Record Number: [id number] Patient Account Number: [id number] Date of Birth/Sex: Treating RN: 1956-02-22 (67 y.o. Kyle [name] Primary Care [name]: Fara Chute Other Clinician: Referring [name]: Treating [name]/Extender: Garret Reddish in Treatment: 3 Visit Information History Since Last Visit Added or deleted any medications: No Patient Arrived: Ambulatory Any new allergies or adverse reactions: No Arrival Time: 12:54 Had [name] fall or experienced change in No Accompanied By: self activities of daily living that may affect Transfer Assistance: None risk of falls: Patient Identification Verified: Yes Signs or symptoms of abuse/neglect since last visito No Secondary Verification Process Completed: Yes Hospitalized since last visit: No Patient Requires Transmission-Based Precautions: No Implantable device outside of the clinic excluding No Patient Has Alerts: Yes cellular tissue based products placed in the center Patient Alerts: R ABI: [place] in clinic since last visit: Has Dressing in Place as Prescribed: Yes Pain Present Now: No Electronic Signature(s) Signed: 05/07/2023 5:25:08 PM By: Zenaida Deed RN, BSN Entered By: Zenaida Deed on 05/07/2023 09:58:36 -------------------------------------------------------------------------------- Encounter Discharge Information Details Patient Name: Date of Service: Kyle Face MA S [name]. 05/07/2023 1:15 PM Medical Record Number: [id number] Patient Account Number: [id number] Date of Birth/Sex: Treating RN: Dec 04, 1955 (67 y.o. Kyle [name] Primary Care Kyle [name]: Fara Chute Other Clinician: Referring Brea Coleson: Treating Sou Nohr/Extender: Garret Reddish in Treatment:  3 Encounter Discharge Information Items Post Procedure Vitals Discharge Condition: Stable Temperature (F): 97.7 Ambulatory Status: Ambulatory Pulse (bpm): 63 Discharge Destination: Home Respiratory Rate (breaths/min): 18 Transportation: Private Auto Blood Pressure (mmHg): 163/89 Accompanied By: self Schedule Follow-up Appointment: Yes Clinical Summary of Care: Patient Declined Electronic Signature(s) Signed: 05/07/2023 5:25:08 PM By: Zenaida Deed RN, BSN Entered By: Zenaida Deed on 05/07/2023 10:35:56 DEMERICK, BUCHHOLTZ [name] ([id number]) [id number] Page 2 of 8 -------------------------------------------------------------------------------- Lower Extremity Assessment Details Patient Name: Date of Service: Kyle [name] [name]. 05/07/2023 1:15 PM Medical Record Number: [id number] Patient Account Number: [id number] Date of Birth/Sex: Treating RN: [birth year] (67 y.o. Kyle [name] Primary Care [name]: Fara Chute Other Clinician: Referring Chevella Pearce: Treating Kyle [name]/Extender: Garret Reddish in Treatment: 3 Edema Assessment Assessed: [Left: No] [Right: No] Edema: [Left: N] [Right: o] Calf Left: Right: Point of Measurement: From Medial Instep 34 cm Ankle Left: Right: Point of Measurement: From Medial Instep 20 cm Vascular Assessment Pulses: Dorsalis Pedis Palpable: [Right:Yes] Extremity colors, hair growth, and conditions: Extremity Color: [Right:Normal] Hair Growth on Extremity: [Right:No] Temperature of Extremity: [Right:Warm] Capillary Refill: [Right:< 3 seconds] Dependent Rubor: [Right:No No] Electronic Signature(s) Signed: 05/07/2023 5:25:08 PM By: Zenaida Deed RN, BSN Entered By: Zenaida Deed on 05/07/2023 10:03:05 -------------------------------------------------------------------------------- Multi Wound Chart Details Patient Name: Date of Service: Kyle Face MA S [name]. 05/07/2023 1:15 PM Medical  Record Number: [id number] Patient Account Number: [id number] Date of Birth/Sex: Treating RN: January 22, 1956 (67 y.o. M) Primary Care [name]: Fara Chute Other Clinician: Referring Kyle [name]: Treating [name]/Extender: Garret Reddish in Treatment: 3 Vital Signs Height(in): 73 Capillary Blood Glucose(mg/dl): 595 Weight(lbs): 638 Pulse(bpm): 63 Body Mass Index(BMI): 23.1 Blood Pressure(mmHg): 163/89 Temperature(F): 97.7 Respiratory Rate(breaths/min): 18 [1:Photos:] [N/[name]:N/[name]] Right T Fourth oe N/[name] N/[name] Wound Location: Trauma N/[name] N/[name] Wounding Event: Diabetic Wound/Ulcer of the Lower N/[name] N/[name] Primary Etiology: Extremity Sleep Apnea, Type II Diabetes, N/[name] N/[name] Comorbid History: Osteomyelitis 02/28/2023 N/[name] N/[name] Date Acquired: 3 N/[name]  N/[name] Weeks of Treatment: Open N/[name] N/[name] Wound Status: No N/[name] N/[name] Wound Recurrence: 0.2x0.4x0.1 N/[name] N/[name] Measurements L x W x D (cm) 0.063 N/[name] N/[name] [name] (cm) : rea 0.006 N/[name] N/[name] Volume (cm) : 71.40% N/[name] N/[name] % Reduction in [name] rea: 90.90% N/[name] N/[name] % Reduction in Volume: Grade 2 N/[name] N/[name] Classification: Small N/[name] N/[name] Exudate [name] mount: Serous N/[name] N/[name] Exudate Type: amber N/[name] N/[name] Exudate Color: Distinct, outline attached N/[name] N/[name] Wound Margin: Small (1-33%) N/[name] N/[name] Granulation [name] mount: Red N/[name] N/[name] Granulation Quality: Small (1-33%) N/[name] N/[name] Necrotic [name] mount: Fat Layer (Subcutaneous Tissue): Yes N/[name] N/[name] Exposed Structures: Bone: Yes Fascia: No Tendon: No Muscle: No Joint: No Small (1-33%) N/[name] N/[name] Epithelialization: Debridement - Selective/Open Wound N/[name] N/[name] Debridement: Pre-procedure Verification/Time Out 13:20 N/[name] N/[name] Taken: Lidocaine 4% Topical Solution N/[name] N/[name] Pain Control: Slough N/[name] N/[name] Tissue Debrided: Skin/Epidermis N/[name] N/[name] Level: 0.06 N/[name] N/[name] Debridement [name] (sq cm): rea Curette N/[name] N/[name] Instrument: Minimum N/[name] N/[name] Bleeding: Pressure N/[name] N/[name] Hemostasis [name] chieved: 0 N/[name] N/[name] Procedural Pain: 0 N/[name] N/[name] Post  Procedural Pain: Procedure was tolerated well N/[name] N/[name] Debridement Treatment Response: 0.2x0.4x0.1 N/[name] N/[name] Post Debridement Measurements L x W x D (cm) 0.006 N/[name] N/[name] Post Debridement Volume: (cm) No Abnormalities Noted N/[name] N/[name] Periwound Skin Texture: Maceration: No N/[name] N/[name] Periwound Skin Moisture: Rubor: Yes N/[name] N/[name] Periwound Skin Color: No Abnormality N/[name] N/[name] Temperature: Debridement N/[name] N/[name] Procedures Performed: Treatment Notes Wound #1 (Toe Fourth) Wound Laterality: Right Cleanser Soap and Water Discharge Instruction: May shower and wash wound with dial antibacterial soap and water prior to dressing change. Wound Cleanser Discharge Instruction: Cleanse the wound with wound cleanser prior to applying [name] clean dressing using gauze sponges, not tissue or cotton balls. Peri-Wound Care Topical Gentamicin Discharge Instruction: As directed by physician Mupirocin Ointment Discharge Instruction: Apply Mupirocin (Bactroban) as instructed Primary Dressing Maxorb Extra Ag+ Alginate Dressing, 2x2 (in/in) Discharge Instruction: Apply to wound bed as instructed ARMONE, DRAKES [name] ([id number]) [phone].pdf Page 4 of 8 Secondary Dressing Woven Gauze Sponges 2x2 in Discharge Instruction: Apply over primary dressing as directed. Secured With Conforming Stretch Gauze Bandage, Sterile 2x75 (in/in) Discharge Instruction: Secure with stretch gauze as directed. 1M Medipore H Soft Cloth Surgical T ape, 4 x 10 (in/yd) Discharge Instruction: Secure with tape as directed. Compression Wrap Compression Stockings Add-Ons Electronic Signature(s) Signed: 05/07/2023 1:44:05 PM By: Duanne Guess MD FACS Entered By: Duanne Guess on 05/07/2023 10:44:05 -------------------------------------------------------------------------------- Multi-Disciplinary Care Plan Details Patient Name: Date of Service: Kyle Face MA S [name]. 05/07/2023 1:15 PM Medical Record Number:  [id number] Patient Account Number: [id number] Date of Birth/Sex: Treating RN: May 22, 1956 (67 y.o. Kyle [name] Primary Care Teena Mangus: Fara Chute Other Clinician: Referring Jehieli Brassell: Treating Tonni Mansour/Extender: Garret Reddish in Treatment: 3 Multidisciplinary Care Plan reviewed with physician Active Inactive Abuse / Safety / Falls / Self Care Management Nursing Diagnoses: Impaired physical mobility Knowledge deficit related to: safety; personal, health (wound), emergency Potential for falls Goals: Patient/caregiver will verbalize/demonstrate measures taken to improve the patient's personal safety Date Initiated: 04/10/2023 Target Resolution Date: 05/25/2023 Goal Status: Active Patient/caregiver will verbalize/demonstrate measures taken to prevent injury and/or falls Date Initiated: 04/10/2023 Target Resolution Date: 05/25/2023 Goal Status: Active Interventions: Assess fall risk on admission and as needed Assess impairment of mobility on admission and as needed per policy Notes: Wound/Skin Impairment Nursing Diagnoses: Impaired tissue integrity Knowledge deficit related to ulceration/compromised skin integrity Goals: Patient/caregiver will verbalize understanding of skin care regimen Date  Initiated: 04/10/2023 Target Resolution Date: 05/25/2023 Goal Status: Active GANNICUS, LUERS [name] ([id number]) [id number] Page 5 of 8 Interventions: Assess patient/caregiver ability to obtain necessary supplies Assess patient/caregiver ability to perform ulcer/skin care regimen upon admission and as needed Assess ulceration(s) every visit Treatment Activities: Referred to DME Taevion Sikora for dressing supplies : 04/10/2023 Skin care regimen initiated : 04/10/2023 Topical wound management initiated : 04/10/2023 Notes: Electronic Signature(s) Signed: 05/07/2023 5:25:08 PM By: Zenaida Deed RN, BSN Entered By: Zenaida Deed on 05/07/2023  10:05:51 -------------------------------------------------------------------------------- Pain Assessment Details Patient Name: Date of Service: Kyle Face MA S [name]. 05/07/2023 1:15 PM Medical Record Number: [id number] Patient Account Number: [id number] Date of Birth/Sex: Treating RN: 10-16-1955 (67 y.o. Kyle [name] Primary Care Montina Dorrance: Fara Chute Other Clinician: Referring Ziyonna Christner: Treating Vicki Pasqual/Extender: Garret Reddish in Treatment: 3 Active Problems Location of Pain Severity and Description of Pain Patient Has Paino No Site Locations Rate the pain. Current Pain Level: 0 Pain Management and Medication Current Pain Management: Electronic Signature(s) Signed: 05/07/2023 5:25:08 PM By: Zenaida Deed RN, BSN Entered By: Zenaida Deed on 05/07/2023 09:59:30 Patient/Caregiver Education Details -------------------------------------------------------------------------------- Lafe Garin ([id number]) [id number] Page 6 of 8 Patient Name: Date of Service: Kyle [name] [place] 10/21/2024andnbsp1:15 PM Medical Record Number: [id number] Patient Account Number: [id number] Date of Birth/Gender: Treating RN: 16-Jan-1956 (67 y.o. Kyle [name] Primary Care Physician: Fara Chute Other Clinician: Referring Physician: Treating Physician/Extender: Garret Reddish in Treatment: 3 Education Assessment Education Provided To: Patient Education Topics Provided Infection: Methods: Explain/Verbal Responses: Reinforcements needed, State content correctly Wound/Skin Impairment: Methods: Explain/Verbal Responses: Reinforcements needed, State content correctly Electronic Signature(s) Signed: 05/07/2023 5:25:08 PM By: Zenaida Deed RN, BSN Entered By: Zenaida Deed on 05/07/2023 10:06:23 -------------------------------------------------------------------------------- Wound Assessment  Details Patient Name: Date of Service: Kyle Face MA S [name]. 05/07/2023 1:15 PM Medical Record Number: [id number] Patient Account Number: [id number] Date of Birth/Sex: Treating RN: 1955-08-13 (67 y.o. Kyle [name] Primary Care Hila Bolding: Fara Chute Other Clinician: Referring Adellyn Capek: Treating Navin Dogan/Extender: Garret Reddish in Treatment: 3 Wound Status Wound Number: 1 Primary Etiology: Diabetic Wound/Ulcer of the Lower Extremity Wound Location: Right T Fourth oe Wound Status: Open Wounding Event: Trauma Comorbid History: Sleep Apnea, Type II Diabetes, Osteomyelitis Date Acquired: 02/28/2023 Weeks Of Treatment: 3 Clustered Wound: No Photos Wound Measurements Length: (cm) 0.2 Width: (cm) 0.4 Depth: (cm) 0.1 Area: (cm) 0.0 Volume: (cm) 0.0 Sedlacek, Nyeem [name] ([id number]) Wound Description Classification: Grade 2 Wound Margin: Distinct, outline attache Exudate Amount: Small Exudate Type: Serous Exudate Color: amber Foul Odor After Cleansing: Slough/Fibrino % Reduction in Area: 71.4% % Reduction in Volume: 90.9% Epithelialization: Small (1-33%) 63 Tunneling: No 06 Undermining: No [id number] Page 7 of 8 d No Yes Wound Bed Granulation Amount: Small (1-33%) Exposed Structure Granulation Quality: Red Fascia Exposed: No Necrotic Amount: Small (1-33%) Fat Layer (Subcutaneous Tissue) Exposed: Yes Necrotic Quality: Adherent Slough Tendon Exposed: No Muscle Exposed: No Joint Exposed: No Bone Exposed: Yes Periwound Skin Texture Texture Color No Abnormalities Noted: Yes No Abnormalities Noted: No Rubor: Yes Moisture No Abnormalities Noted: Yes Temperature / Pain Temperature: No Abnormality Treatment Notes Wound #1 (Toe Fourth) Wound Laterality: Right Cleanser Soap and Water Discharge Instruction: May shower and wash wound with dial antibacterial soap and water prior to dressing change. Wound Cleanser Discharge  Instruction: Cleanse the wound with wound cleanser prior to applying [name] clean dressing using gauze sponges, not tissue or cotton balls. Peri-Wound Care Topical Gentamicin Discharge Instruction:  As directed by physician Mupirocin Ointment Discharge Instruction: Apply Mupirocin (Bactroban) as instructed Primary Dressing Maxorb Extra Ag+ Alginate Dressing, 2x2 (in/in) Discharge Instruction: Apply to wound bed as instructed Secondary Dressing Woven Gauze Sponges 2x2 in Discharge Instruction: Apply over primary dressing as directed. Secured With Conforming Stretch Gauze Bandage, Sterile 2x75 (in/in) Discharge Instruction: Secure with stretch gauze as directed. 69M Medipore H Soft Cloth Surgical T ape, 4 x 10 (in/yd) Discharge Instruction: Secure with tape as directed. Compression Wrap Compression Stockings Add-Ons Electronic Signature(s) Signed: 05/07/2023 5:25:08 PM By: Zenaida Deed RN, BSN Entered By: Zenaida Deed on 05/07/2023 10:04:40 AVONTAY, KREINBRINK [name] ([id number]) [id number] Page 8 of 8 -------------------------------------------------------------------------------- Vitals Details Patient Name: Date of Service: Kyle [name] [place] [name]. 05/07/2023 1:15 PM Medical Record Number: [id number] Patient Account Number: [id number] Date of Birth/Sex: Treating RN: [birth year] (67 y.o. Kyle [name] Primary Care Titilayo Hagans: Fara Chute Other Clinician: Referring Rowen Hur: Treating Gaddiel Cullens/Extender: Garret Reddish in Treatment: 3 Vital Signs Time Taken: 12:58 Temperature (F): 97.7 Height (in): 73 Pulse (bpm): 63 Weight (lbs): 175 Respiratory Rate (breaths/min): 18 Body Mass Index (BMI): 23.1 Blood Pressure (mmHg): 163/89 Capillary Blood Glucose (mg/dl): 782 Reference Range: 80 - 120 mg / dl Notes glucose per pt report this am Electronic Signature(s) Signed: 05/07/2023 5:25:08 PM By: Zenaida Deed RN, BSN Entered By: Zenaida Deed on 05/07/2023 09:59:20

## 2023-05-10 DIAGNOSIS — K640 First degree hemorrhoids: Secondary | ICD-10-CM | POA: Diagnosis not present

## 2023-05-10 DIAGNOSIS — Z79899 Other long term (current) drug therapy: Secondary | ICD-10-CM | POA: Diagnosis not present

## 2023-05-10 DIAGNOSIS — Z1211 Encounter for screening for malignant neoplasm of colon: Secondary | ICD-10-CM | POA: Diagnosis not present

## 2023-05-10 DIAGNOSIS — Z794 Long term (current) use of insulin: Secondary | ICD-10-CM | POA: Diagnosis not present

## 2023-05-10 DIAGNOSIS — E119 Type 2 diabetes mellitus without complications: Secondary | ICD-10-CM | POA: Diagnosis not present

## 2023-05-10 DIAGNOSIS — K5730 Diverticulosis of large intestine without perforation or abscess without bleeding: Secondary | ICD-10-CM | POA: Diagnosis not present

## 2023-05-18 ENCOUNTER — Encounter (HOSPITAL_BASED_OUTPATIENT_CLINIC_OR_DEPARTMENT_OTHER): Payer: Medicare PPO | Attending: General Surgery | Admitting: General Surgery

## 2023-05-18 DIAGNOSIS — L97812 Non-pressure chronic ulcer of other part of right lower leg with fat layer exposed: Secondary | ICD-10-CM | POA: Diagnosis not present

## 2023-05-18 DIAGNOSIS — L97516 Non-pressure chronic ulcer of other part of right foot with bone involvement without evidence of necrosis: Secondary | ICD-10-CM | POA: Diagnosis not present

## 2023-05-18 DIAGNOSIS — E11621 Type 2 diabetes mellitus with foot ulcer: Secondary | ICD-10-CM | POA: Insufficient documentation

## 2023-05-18 DIAGNOSIS — E11622 Type 2 diabetes mellitus with other skin ulcer: Secondary | ICD-10-CM | POA: Insufficient documentation

## 2023-05-18 NOTE — Progress Notes (Addendum)
Kyle [name] ([id number]) [id number] Page 1 of 7 Visit Report for 05/18/2023 Arrival Information Details Patient Name: Date of Service: Kyle [name] Kyle [name]. 05/18/2023 10:15 [name] Medical Record Number: [id number] Patient Account Number: [id number] Date of Birth/Sex: Treating RN: 09/24/1955 (67 y.o. M) Primary Care Aryah Doering: Fara Chute Other Clinician: Referring Arleene Settle: Treating Tikesha Mort/Extender: Garret Reddish in Treatment: 5 Visit Information History Since Last Visit Added or deleted any medications: No Patient Arrived: Ambulatory Any new allergies or adverse reactions: No Arrival Time: 10:11 Had [name] fall or experienced change in No Accompanied By: self activities of daily living that may affect Transfer Assistance: None risk of falls: Patient Identification Verified: Yes Signs or symptoms of abuse/neglect since last visito No Secondary Verification Process Completed: Yes Hospitalized since last visit: No Patient Requires Transmission-Based Precautions: No Implantable device outside of the clinic excluding No Patient Has Alerts: Yes cellular tissue based products placed in the center Patient Alerts: R ABI: Narka in clinic since last visit: Pain Present Now: No Electronic Signature(s) Signed: 05/18/2023 10:12:47 AM By: Dayton Scrape Entered By: Dayton Scrape on 05/18/2023 07:11:36 -------------------------------------------------------------------------------- Encounter Discharge Information Details Patient Name: Date of Service: Kyle Face MA S [name]. 05/18/2023 10:15 [name] Medical Record Number: [id number] Patient Account Number: [id number] Date of Birth/Sex: Treating RN: May 07, 1956 (67 y.o. [name] Primary Care Berk Pilot: Fara Chute Other Clinician: Referring Dhruv Christina: Treating Drevion Offord/Extender: Garret Reddish in Treatment: 5 Encounter Discharge Information Items Post Procedure  Vitals Discharge Condition: Stable Temperature (F): 97.5 Ambulatory Status: Ambulatory Pulse (bpm): 59 Discharge Destination: Home Respiratory Rate (breaths/min): 18 Transportation: Private Auto Blood Pressure (mmHg): 140/80 Accompanied By: self Schedule Follow-up Appointment: Yes Clinical Summary of Care: Patient Declined Electronic Signature(s) Signed: 05/18/2023 11:58:07 AM By: Samuella Bruin Entered By: Samuella Bruin on 05/18/2023 07:46:45 Kyle [name] ([id number]) [id number] Page 2 of 7 -------------------------------------------------------------------------------- Lower Extremity Assessment Details Patient Name: Date of Service: Kyle [name] Kyle [name]. 05/18/2023 10:15 [name] Medical Record Number: [id number] Patient Account Number: [id number] Date of Birth/Sex: Treating RN: 1955/10/05 (67 y.o. Dianna Limbo Primary Care Khalif Stender: Fara Chute Other Clinician: Referring Amity Roes: Treating Krist Rosenboom/Extender: Garret Reddish in Treatment: 5 Edema Assessment Assessed: Kyra Searles: No] [Right: No] Edema: [Left: N] [Right: o] Calf Left: Right: Point of Measurement: From Medial Instep 32 cm Ankle Left: Right: Point of Measurement: From Medial Instep 19 cm Vascular Assessment Pulses: Dorsalis Pedis Palpable: [Right:Yes] Extremity colors, hair growth, and conditions: Extremity Color: [Right:Normal] Hair Growth on Extremity: [Right:No] Temperature of Extremity: [Right:Warm] Capillary Refill: [Right:< 3 seconds] Dependent Rubor: [Right:No No] Electronic Signature(s) Signed: 05/18/2023 12:02:50 PM By: Karie Schwalbe RN Entered By: Karie Schwalbe on 05/18/2023 07:15:52 -------------------------------------------------------------------------------- Multi Wound Chart Details Patient Name: Date of Service: Kyle Face MA S [name]. 05/18/2023 10:15 [name] Medical Record Number: [id number] Patient Account Number: [id number] Date  of Birth/Sex: Treating RN: 1955-09-05 (67 y.o. M) Primary Care Marinna Blane: Fara Chute Other Clinician: Referring Maayan Jenning: Treating Eithan Beagle/Extender: Garret Reddish in Treatment: 5 Vital Signs Height(in): 73 Capillary Blood Glucose(mg/dl): 416 Weight(lbs): 606 Pulse(bpm): 59 Body Mass Index(BMI): 23.1 Blood Pressure(mmHg): 140/80 Temperature(F): 97.5 Respiratory Rate(breaths/min): 18 [1:Photos:] [N/[name]:N/[name]] Right T Fourth oe N/[name] N/[name] Wound Location: Trauma N/[name] N/[name] Wounding Event: Diabetic Wound/Ulcer of the Lower N/[name] N/[name] Primary Etiology: Extremity Sleep Apnea, Type II Diabetes, N/[name] N/[name] Comorbid History: Osteomyelitis 02/28/2023 N/[name] N/[name] Date Acquired: 5 N/[name] N/[name] Weeks of Treatment: Open N/[name] N/[name] Wound Status: No  N/[name] N/[name] Wound Recurrence: 0.2x0.4x0.2 N/[name] N/[name] Measurements L x W x D (cm) 0.063 N/[name] N/[name] [name] (cm) : rea 0.013 N/[name] N/[name] Volume (cm) : 71.40% N/[name] N/[name] % Reduction in [name] rea: 80.30% N/[name] N/[name] % Reduction in Volume: 12 Starting Position 1 (o'clock): 12 Ending Position 1 (o'clock): 0.2 Maximum Distance 1 (cm): Yes N/[name] N/[name] Undermining: Grade 2 N/[name] N/[name] Classification: Small N/[name] N/[name] Exudate [name] mount: Serous N/[name] N/[name] Exudate Type: amber N/[name] N/[name] Exudate Color: Distinct, outline attached N/[name] N/[name] Wound Margin: Small (1-33%) N/[name] N/[name] Granulation [name] mount: Red N/[name] N/[name] Granulation Quality: Small (1-33%) N/[name] N/[name] Necrotic [name] mount: Eschar, Adherent Slough N/[name] N/[name] Necrotic Tissue: Fat Layer (Subcutaneous Tissue): Yes N/[name] N/[name] Exposed Structures: Bone: Yes Fascia: No Tendon: No Muscle: No Joint: No Small (1-33%) N/[name] N/[name] Epithelialization: Debridement - Excisional N/[name] N/[name] Debridement: Pre-procedure Verification/Time Out 10:25 N/[name] N/[name] Taken: Lidocaine 4% Topical Solution N/[name] N/[name] Pain Control: Subcutaneous, Slough N/[name] N/[name] Tissue Debrided: Skin/Subcutaneous Tissue N/[name] N/[name] Level: 0.13 N/[name] N/[name] Debridement [name] (sq cm): rea Curette N/[name]  N/[name] Instrument: Minimum N/[name] N/[name] Bleeding: Pressure N/[name] N/[name] Hemostasis [name] chieved: 0 N/[name] N/[name] Procedural Pain: 0 N/[name] N/[name] Post Procedural Pain: Procedure was tolerated well N/[name] N/[name] Debridement Treatment Response: 0.4x0.4x0.2 N/[name] N/[name] Post Debridement Measurements L x W x D (cm) 0.025 N/[name] N/[name] Post Debridement Volume: (cm) Callus: Yes N/[name] N/[name] Periwound Skin Texture: Maceration: No N/[name] N/[name] Periwound Skin Moisture: Erythema: Yes N/[name] N/[name] Periwound Skin Color: Rubor: Yes Circumferential N/[name] N/[name] Erythema Location: No Abnormality N/[name] N/[name] Temperature: Debridement N/[name] N/[name] Procedures Performed: Treatment Notes Electronic Signature(s) Signed: 05/18/2023 10:37:22 AM By: Duanne Guess MD FACS Entered By: Duanne Guess on 05/18/2023 07:37:22 Kyle [name] ([id number]) [id number] Page 4 of 7 -------------------------------------------------------------------------------- Multi-Disciplinary Care Plan Details Patient Name: Date of Service: Kyle [name] Kyle [name]. 05/18/2023 10:15 [name] Medical Record Number: [id number] Patient Account Number: [id number] Date of Birth/Sex: Treating RN: May 25, 1956 (67 y.o. [name] Primary Care Lourdez Mcgahan: Fara Chute Other Clinician: Referring Ammi Hutt: Treating Oakley Kossman/Extender: Garret Reddish in Treatment: 5 Multidisciplinary Care Plan reviewed with physician Active Inactive Abuse / Safety / Falls / Self Care Management Nursing Diagnoses: Impaired physical mobility Knowledge deficit related to: safety; personal, health (wound), emergency Potential for falls Goals: Patient/caregiver will verbalize/demonstrate measures taken to improve the patient's personal safety Date Initiated: 04/10/2023 Target Resolution Date: 05/25/2023 Goal Status: Active Patient/caregiver will verbalize/demonstrate measures taken to prevent injury and/or falls Date Initiated: 04/10/2023 Target Resolution Date:  05/25/2023 Goal Status: Active Interventions: Assess fall risk on admission and as needed Assess impairment of mobility on admission and as needed per policy Notes: Wound/Skin Impairment Nursing Diagnoses: Impaired tissue integrity Knowledge deficit related to ulceration/compromised skin integrity Goals: Patient/caregiver will verbalize understanding of skin care regimen Date Initiated: 04/10/2023 Target Resolution Date: 05/25/2023 Goal Status: Active Interventions: Assess patient/caregiver ability to obtain necessary supplies Assess patient/caregiver ability to perform ulcer/skin care regimen upon admission and as needed Assess ulceration(s) every visit Treatment Activities: Referred to DME Oliver Neuwirth for dressing supplies : 04/10/2023 Skin care regimen initiated : 04/10/2023 Topical wound management initiated : 04/10/2023 Notes: Electronic Signature(s) Signed: 05/18/2023 1:05:52 PM By: Zenaida Deed RN, BSN Entered By: Zenaida Deed on 05/18/2023 07:26:44 -------------------------------------------------------------------------------- Pain Assessment Details Patient Name: Date of Service: Kyle Face MA S [name]. 05/18/2023 10:15 [name] Medical Record Number: [id number] Patient Account Number: [id number] Date of Birth/Sex: Treating RN: 12-Apr-1956 (67 y.o. RAKESH, DUTKO [name] ([id number]) [id number] Page 5 of  7 Primary Care Mariaisabel Bodiford: Fara Chute Other Clinician: Referring Pranish Akhavan: Treating Trestin Vences/Extender: Garret Reddish in Treatment: 5 Active Problems Location of Pain Severity and Description of Pain Patient Has Paino No Site Locations Pain Management and Medication Current Pain Management: Electronic Signature(s) Signed: 05/18/2023 10:12:47 AM By: Dayton Scrape Entered By: Dayton Scrape on 05/18/2023 07:12:23 -------------------------------------------------------------------------------- Patient/Caregiver Education  Details Patient Name: Date of Service: Kyle Face MA S [name]. 11/1/2024andnbsp10:15 [name] Medical Record Number: [id number] Patient Account Number: [id number] Date of Birth/Gender: Treating RN: July 04, 1956 (67 y.o. [name] Primary Care Physician: Fara Chute Other Clinician: Referring Physician: Treating Physician/Extender: Garret Reddish in Treatment: 5 Education Assessment Education Provided To: Patient Education Topics Provided Elevated Blood Sugar/ Impact on Healing: Methods: Explain/Verbal Responses: Reinforcements needed, State content correctly Wound/Skin Impairment: Methods: Explain/Verbal Responses: Reinforcements needed, State content correctly Electronic Signature(s) Signed: 05/18/2023 1:05:52 PM By: Zenaida Deed RN, BSN Entered By: Zenaida Deed on 05/18/2023 07:27:08 Kyle [name] ([id number]) [id number] Page 6 of 7 -------------------------------------------------------------------------------- Wound Assessment Details Patient Name: Date of Service: Kyle [name] Kyle [name]. 05/18/2023 10:15 [name] Medical Record Number: [id number] Patient Account Number: [id number] Date of Birth/Sex: Treating RN: 10-May-1956 (67 y.o. Dianna Limbo Primary Care Syaire Saber: Fara Chute Other Clinician: Referring Diannah Rindfleisch: Treating Eevie Lapp/Extender: Garret Reddish in Treatment: 5 Wound Status Wound Number: 1 Primary Etiology: Diabetic Wound/Ulcer of the Lower Extremity Wound Location: Right T Fourth oe Wound Status: Open Wounding Event: Trauma Comorbid History: Sleep Apnea, Type II Diabetes, Osteomyelitis Date Acquired: 02/28/2023 Weeks Of Treatment: 5 Clustered Wound: No Photos Wound Measurements Length: (cm) 0.2 Width: (cm) 0.4 Depth: (cm) 0.2 Area: (cm) 0.063 Volume: (cm) 0.013 % Reduction in Area: 71.4% % Reduction in Volume: 80.3% Epithelialization: Small (1-33%) Tunneling:  No Undermining: Yes Starting Position (o'clock): 12 Ending Position (o'clock): 12 Maximum Distance: (cm) 0.2 Wound Description Classification: Grade 2 Wound Margin: Distinct, outline attached Exudate Amount: Small Exudate Type: Serous Exudate Color: amber Foul Odor After Cleansing: No Slough/Fibrino Yes Wound Bed Granulation Amount: Small (1-33%) Exposed Structure Granulation Quality: Red Fascia Exposed: No Necrotic Amount: Small (1-33%) Fat Layer (Subcutaneous Tissue) Exposed: Yes Necrotic Quality: Eschar, Adherent Slough Tendon Exposed: No Muscle Exposed: No Joint Exposed: No Bone Exposed: Yes Periwound Skin Texture Texture Color No Abnormalities Noted: No No Abnormalities Noted: No Callus: Yes Erythema: Yes Erythema Location: Circumferential Moisture Rubor: Yes No Abnormalities Noted: Yes Temperature / Pain Temperature: No Abnormality KARLA, VINES [name] ([id number]) [id number] Page 7 of 7 Treatment Notes Wound #1 (Toe Fourth) Wound Laterality: Right Cleanser Soap and Water Discharge Instruction: May shower and wash wound with dial antibacterial soap and water prior to dressing change. Wound Cleanser Discharge Instruction: Cleanse the wound with wound cleanser prior to applying [name] clean dressing using gauze sponges, not tissue or cotton balls. Peri-Wound Care Topical Gentamicin Discharge Instruction: As directed by physician Mupirocin Ointment Discharge Instruction: Apply Mupirocin (Bactroban) as instructed Primary Dressing Maxorb Extra Ag+ Alginate Dressing, 2x2 (in/in) Discharge Instruction: Apply to wound bed as instructed Secondary Dressing Woven Gauze Sponges 2x2 in Discharge Instruction: Apply over primary dressing as directed. Secured With Conforming Stretch Gauze Bandage, Sterile 2x75 (in/in) Discharge Instruction: Secure with stretch gauze as directed. 49M Medipore H Soft Cloth Surgical T ape, 4 x 10 (in/yd) Discharge  Instruction: Secure with tape as directed. Compression Wrap Compression Stockings Add-Ons Electronic Signature(s) Signed: 05/18/2023 12:02:50 PM By: Karie Schwalbe RN Entered By: Karie Schwalbe on 05/18/2023 07:21:17 -------------------------------------------------------------------------------- Vitals Details  Patient Name: Date of Service: Kyle [name] Kyle [name]. 05/18/2023 10:15 [name] Medical Record Number: [id number] Patient Account Number: [id number] Date of Birth/Sex: Treating RN: 10/09/1955 (67 y.o. M) Primary Care Amrie Gurganus: Fara Chute Other Clinician: Referring Candise Crabtree: Treating Shunda Rabadi/Extender: Garret Reddish in Treatment: 5 Vital Signs Time Taken: 10:11 Temperature (F): 97.5 Height (in): 73 Pulse (bpm): 59 Weight (lbs): 175 Respiratory Rate (breaths/min): 18 Body Mass Index (BMI): 23.1 Blood Pressure (mmHg): 140/80 Capillary Blood Glucose (mg/dl): 782 Reference Range: 80 - 120 mg / dl Electronic Signature(s) Signed: 05/18/2023 10:12:47 AM By: Dayton Scrape Entered By: Dayton Scrape on 05/18/2023 07:12:17

## 2023-05-18 NOTE — Progress Notes (Signed)
Kyle [name], Kyle [name] ([id number]) [id number] Page 1 of 9 Visit Report for 05/18/2023 Chief Complaint Document Details Patient Name: Date of Service: Kyle [name] Kyle [name]. 05/18/2023 10:15 [name] Medical Record Number: [id number] Patient Account Number: [id number] Date of Birth/Sex: Treating RN: 1955/10/16 (67 y.o. M) Primary Care Provider: Fara Chute Other Clinician: Referring Provider: Treating Provider/Extender: Garret Reddish in Treatment: 5 Information Obtained from: Patient Chief Complaint Patients presents for treatment of an open diabetic ulcer Electronic Signature(s) Signed: 05/18/2023 10:38:10 AM By: Duanne Guess MD FACS Entered By: Duanne Guess on 05/18/2023 10:38:10 -------------------------------------------------------------------------------- Debridement Details Patient Name: Date of Service: Kyle [name]. 05/18/2023 10:15 [name] Medical Record Number: [id number] Patient Account Number: [id number] Date of Birth/Sex: Treating RN: 1956-07-17 (67 y.o. M) Primary Care Provider: Fara Chute Other Clinician: Referring Provider: Treating Provider/Extender: Garret Reddish in Treatment: 5 Debridement Performed for Assessment: Wound #1 Right T Fourth oe Performed By: Physician Duanne Guess, MD The following information was scribed by: Zenaida Deed The information was scribed for: Duanne Guess Debridement Type: Debridement Severity of Tissue Pre Debridement: Bone involvement without necrosis Level of Consciousness (Pre-procedure): Awake and Alert Pre-procedure Verification/Time Out Yes - 10:25 Taken: Start Time: 10:28 Pain Control: Lidocaine 4% Topical Solution Percent of Wound Bed Debrided: 100% T Area Debrided (cm): otal 0.13 Tissue and other material debrided: Non-Viable, Eschar, Slough, Subcutaneous, Slough Level: Skin/Subcutaneous Tissue Debridement Description:  Excisional Instrument: Curette Bleeding: Minimum Hemostasis Achieved: Pressure Procedural Pain: 0 Post Procedural Pain: 0 Response to Treatment: Procedure was tolerated well Level of Consciousness (Post- Awake and Alert procedure): Post Debridement Measurements of Total Wound Length: (cm) 0.4 Width: (cm) 0.4 Depth: (cm) 0.2 Volume: (cm) 0.025 Kyle [name], Kyle [name] ([id number]) [id number] Page 2 of 9 Character of Wound/Ulcer Post Debridement: Improved Severity of Tissue Post Debridement: Bone involvement without necrosis Post Procedure Diagnosis Same as Pre-procedure Electronic Signature(s) Signed: 05/18/2023 10:38:05 AM By: Duanne Guess MD FACS Entered By: Duanne Guess on 05/18/2023 10:38:05 -------------------------------------------------------------------------------- HPI Details Patient Name: Date of Service: Kyle [name]. 05/18/2023 10:15 [name] Medical Record Number: [id number] Patient Account Number: [id number] Date of Birth/Sex: Treating RN: May 31, 1956 (67 y.o. M) Primary Care Provider: Fara Chute Other Clinician: Referring Provider: Treating Provider/Extender: Garret Reddish in Treatment: 5 History of Present Illness HPI Description: ADMISSION 04/10/2023 ***ABI NON-COMPRESSIBLE; STRONG DOPPLER SIGNALS*** This is [name] 67 year old diabetic (no recent hemoglobin A1c available for review) who suffered [name] syncopal event in early September. He apparently injured his left fourth toe as well as had pain in his ankle. The went to see his doctor about [name] week after his event due to continued pain in his ankle. Apparently his PCP was more concerned about his toe and sent him to the ED for further evaluation due to concern for possible osteomyelitis. Examination imaging done in the emergency department showed [name] comminuted fracture of the middle phalanx of the fourth toe with minimal displacement. No other abnormalities  were appreciated. He was given [name] course of cephalexin and referred to the wound care center for further evaluation and management. They did put him in [name] postop shoe for his toe and an Ace bandage for the ankle. He has not been wearing the postop shoe due to gait instability and has been applying peroxide and Neosporin to his toe wound. 04/26/2023: The depth of the wound has filled in completely with granulation tissue. There is no slough accumulation, no malodor or purulent  drainage. 05/07/2023: There is granulation tissue filling the wound, but when [name] probe is inserted, bone is still appreciated. There is [name] little bit of slough accumulation. 05/18/2023: The wound orifice has narrowed but the depth is about the same and still probes to bone. There is slough and eschar accumulation. His insurance has denied him for Grafix. Electronic Signature(s) Signed: 05/18/2023 10:39:06 AM By: Duanne Guess MD FACS Entered By: Duanne Guess on 05/18/2023 10:39:06 -------------------------------------------------------------------------------- Physical Exam Details Patient Name: Date of Service: Kyle [name]. 05/18/2023 10:15 [name] Medical Record Number: [id number] Patient Account Number: [id number] Date of Birth/Sex: Treating RN: 05-25-1956 (67 y.o. M) Primary Care Provider: Fara Chute Other Clinician: Referring Provider: Treating Provider/Extender: Garret Reddish in Treatment: 5 Constitutional Slightly hypertensive. Slightly bradycardic. . . no acute distress. Respiratory Normal work of breathing on room air.Kyle [name], Kyle [name] ([id number]) [id number] Page 3 of 9 Notes 05/18/2023: The wound orifice has narrowed but the depth is about the same and still probes to bone. There is slough and eschar accumulation. Electronic Signature(s) Signed: 05/18/2023 10:39:46 AM By: Duanne Guess MD FACS Entered By: Duanne Guess on 05/18/2023  10:39:46 -------------------------------------------------------------------------------- Physician Orders Details Patient Name: Date of Service: Kyle [name]. 05/18/2023 10:15 [name] Medical Record Number: [id number] Patient Account Number: [id number] Date of Birth/Sex: Treating RN: 27-Jan-1956 (67 y.o. 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 - Dr. Lady Gary RM 1 Return [name] Anesthetic (In clinic) Topical Lidocaine 5% applied to wound bed (In clinic) Topical Lidocaine 4% applied to wound bed Cellular or Tissue Based Products Cellular or Tissue Based Product Type: - run IVR for grafix-denied Bathing/ Shower/ Hygiene May shower and wash wound with soap and water. Wound Treatment Wound #1 - T Fourth oe Wound Laterality: Right Cleanser: Soap and Water 1 x Per Day/30 Days Discharge Instructions: May shower and wash wound with dial antibacterial soap and water prior to dressing change. Cleanser: Wound Cleanser 1 x Per Day/30 Days Discharge Instructions: Cleanse the wound with wound cleanser prior to applying [name] clean dressing using gauze sponges, not tissue or cotton balls. Topical: Gentamicin 1 x Per Day/30 Days Discharge Instructions: As directed by physician Topical: Mupirocin Ointment 1 x Per Day/30 Days Discharge Instructions: Apply Mupirocin (Bactroban) as instructed Prim Dressing: Maxorb Extra Ag+ Alginate  Dressing, 2x2 (in/in) 1 x Per Day/30 Days ary Discharge Instructions: Apply to wound bed as instructed Secondary Dressing: Woven Gauze Sponges 2x2 in 1 x Per Day/30 Days Discharge Instructions: Apply over primary dressing as directed. Secured With: Insurance underwriter, Sterile 2x75 (in/in) 1 x Per Day/30 Days Discharge Instructions: Secure with stretch gauze as directed. Secured With: 36M Medipore H Soft Cloth Surgical T ape, 4 x 10 (in/yd) 1 x Per Day/30 Days Discharge Instructions: Secure with tape as directed. Patient Medications Kyle [name], Kyle [name] ([id number]) [id number] Page 4 of 9 llergies: aspirin, ibuprofen [name] Notifications Medication Indication Start End 05/18/2023 gentamicin DOSE topical 0.1 % ointment - Apply to wound with once daily dressing  changes 05/18/2023 mupirocin DOSE topical 2 % ointment - Apply to wound with once daily dressing changes Electronic Signature(s) Signed: 05/18/2023 10:41:12 AM By: Duanne Guess MD FACS Entered By: Duanne Guess on 05/18/2023 10:41:12 -------------------------------------------------------------------------------- Problem List Details Patient Name: Date of Service: Kyle [name]. 05/18/2023 10:15 [name] Medical Record Number: [id number] Patient Account Number: [id number] Date of Birth/Sex: Treating RN: 1955/12/22 (67 y.o. 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 05/18/2023 10:15 [name] Medical Record Number: [id number] Patient Account Number: [id number] Date of Birth/Sex: Treating RN: Jan 11, 1956 (67 y.o. M) Primary Care Provider: Fara Chute Other Clinician: Referring Provider: Treating Provider/Extender: Garret Reddish in Treatment: 5 Subjective Chief Complaint Kyle [name], Kyle [name] ([id number]) [id number] Page 5 of 9 Information obtained from Patient Patients presents for treatment of an open diabetic ulcer History of Present Illness (HPI) ADMISSION 04/10/2023 ***ABI NON-COMPRESSIBLE; STRONG DOPPLER SIGNALS*** This is [name] 67 year old diabetic (no recent hemoglobin A1c available for review) who suffered [name] syncopal event in early September. He apparently injured his left fourth toe as well as had pain in his ankle. The went to see his doctor about [name] week after his event due to continued pain in his ankle. Apparently his PCP was more concerned about his toe and sent him to the ED for further evaluation due to concern for possible osteomyelitis. Examination imaging done in the emergency department showed [name] comminuted fracture of the middle phalanx of the fourth toe with minimal displacement. No other abnormalities were appreciated. He was given [name] course of cephalexin and referred to the wound care center for further evaluation and management. They did put him in [name] postop shoe for his toe and an Ace bandage for the ankle. He has not been wearing the postop shoe due to gait instability and has been applying peroxide and Neosporin to his toe wound. 04/26/2023: The depth of the wound has filled in completely with granulation tissue. There is no slough accumulation, no malodor or purulent  drainage. 05/07/2023: There is granulation tissue filling the wound, but when [name] probe is inserted, bone is still appreciated. There is [name] little bit of slough accumulation. 05/18/2023: The wound orifice has narrowed but the depth is about the same and still probes to bone. There is slough and eschar accumulation. His insurance has denied him for Grafix. Patient History Information obtained from Patient, Chart. Family History Cancer - Mother, Heart Disease - Father, Hypertension - Father, No family history of Diabetes, Hereditary Spherocytosis, Kidney Disease, Lung Disease, Seizures, Stroke, Thyroid Problems, Tuberculosis. Social History Former smoker - quit in 1987, Marital Status - Married, Alcohol Use - Rarely, Drug Use - No History, Caffeine Use - Daily. Medical History Respiratory Patient has history of Sleep Apnea Endocrine Patient has history of Type II Diabetes Musculoskeletal Patient has history of Osteomyelitis - of left ulna Hospitalization/Surgery History - Olecranon bursectomy (Left). - Ulnar nerve transposition (Left). - tooth removal. - spinal stimulator implanted. -  Back surgery. - Femur surgery. - Cholecystectomy. Medical [name] Surgical History Notes nd Constitutional Symptoms (General Health) MSSA infection Eyes Retinal vasculitis Ear/Nose/Mouth/Throat Dysphagia Gastrointestinal GERD Musculoskeletal Polyarthritis Neurologic Temporal arteritis, Occipital neuralgia of right side, NDPH (new daily persistent headache) Psychiatric depression Objective Constitutional Slightly hypertensive. Slightly bradycardic. no acute distress. Vitals Time Taken: 10:11 AM, Height: 73 in, Weight: 175 lbs, BMI: 23.1, Temperature: 97.5 F, Pulse: 59 bpm, Respiratory Rate: 18 breaths/min, Blood Pressure: 140/80 mmHg, Capillary Blood Glucose: 127 mg/dl. Respiratory Normal work of breathing on room air.. General Notes: 05/18/2023: The wound orifice has narrowed but the depth is about the  same and still probes to bone. There is slough and eschar accumulation. Integumentary (Hair, Skin) Wound #1 status is Open. Original cause of wound was Trauma. The date acquired was: 02/28/2023. The wound has been in treatment 5 weeks. The wound is located on the Right T Fourth. The wound measures 0.2cm length x 0.4cm width x 0.2cm depth; 0.063cm^2 area and 0.013cm^3 volume. There is bone and oe Fat Layer (Subcutaneous Tissue) exposed. There is no tunneling noted, however, there is undermining starting at 12:00 and ending at 12:00 with [name] maximum distance of 0.2cm. There is [name] small amount of serous drainage noted. The wound margin is distinct with the outline attached to the wound base. There is small Kyle [name], Kyle [name] ([id number]) [id number] Page 6 of 9 (1-33%) red granulation within the wound bed. There is [name] small (1-33%) amount of necrotic tissue within the wound bed including Eschar and Adherent Slough. The periwound skin appearance had no abnormalities noted for moisture. The periwound skin appearance exhibited: Callus, Rubor, Erythema. The surrounding wound skin color is noted with erythema which is circumferential. Periwound temperature was noted as No Abnormality. 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 There was [name] Excisional Skin/Subcutaneous Tissue Debridement with [name] total area of 0.13 sq cm performed by Duanne Guess, MD. With the following instrument(s): Curette to remove Non-Viable tissue/material. Material removed includes Eschar, Subcutaneous Tissue, and Slough after achieving pain control using  Lidocaine 4% T opical Solution. No specimens were taken. [name] time out was conducted at 10:25, prior to the start of the procedure. [name] Minimum amount of bleeding was controlled with Pressure. The procedure was tolerated well with [name] pain level of 0 throughout and [name] pain level of 0 following the procedure. Post Debridement Measurements: 0.4cm length x 0.4cm width x 0.2cm depth; 0.025cm^3 volume. Character of Wound/Ulcer Post Debridement is improved. Severity of Tissue Post Debridement is: Bone involvement without necrosis. Post procedure Diagnosis Wound #1: Same as Pre-Procedure Plan Follow-up Appointments: Return Appointment in 2 weeks. - Dr. Lady Gary RM 1 Anesthetic: (In clinic) Topical Lidocaine 5% applied to wound bed (In clinic) Topical Lidocaine 4% applied to wound bed Cellular or Tissue Based Products: Cellular or Tissue Based Product Type: - run IVR for grafix-denied Bathing/ Shower/ Hygiene: May shower and wash wound with soap and water. The following medication(s) was prescribed: gentamicin topical 0.1 % ointment Apply to wound with once daily dressing changes starting 05/18/2023 mupirocin topical 2 % ointment Apply to wound with once daily dressing changes starting 05/18/2023 WOUND #1: - T Fourth Wound Laterality: Right oe Cleanser: Soap and  Water 1 x Per Day/30 Days Discharge Instructions: May shower and wash wound with dial antibacterial soap and water prior to dressing change. Cleanser: Wound Cleanser 1 x Per Day/30 Days Discharge Instructions: Cleanse the wound with wound cleanser prior to applying [name] clean dressing using gauze sponges, not tissue or cotton balls. Topical: Gentamicin 1 x Per Day/30 Days Discharge Instructions: As directed by physician Topical: Mupirocin Ointment 1 x Per Day/30 Days Discharge Instructions: Apply Mupirocin (Bactroban) as instructed Prim Dressing: Maxorb Extra Ag+ Alginate Dressing, 2x2 (in/in) 1 x Per Day/30 Days ary Discharge Instructions: Apply to  wound bed as instructed Secondary Dressing: Woven Gauze Sponges 2x2 in 1 x Per Day/30 Days Discharge Instructions: Apply over primary dressing as directed. Secured With: Insurance underwriter, Sterile 2x75 (in/in) 1 x Per Day/30 Days Discharge Instructions: Secure with stretch gauze as directed. Secured With: 23M Medipore H Soft Cloth Surgical T ape, 4 x 10 (in/yd) 1 x Per Day/30 Days Discharge Instructions: Secure with tape as directed. 05/18/2023: The wound orifice has narrowed but the depth is about the same and still probes to bone. There is slough and eschar accumulation. His insurance has denied him for Grafix. I used [name] curette to debride eschar, slough, and subcutaneous tissue from his wound. Because of the prolonged bone exposure, I am going to order an MRI to evaluate for osteomyelitis, although none was initially seen on plain x-ray. We discussed the potential options, if osteomyelitis is identified. These would include consideration for hyperbaric oxygen therapy. In the meantime, we will continue topical gentamicin and mupirocin with silver alginate. The patient says he plans to appeal the decision with [facility] which I think is [name] good idea. Follow-up in 2 weeks due to clinic availability. Electronic Signature(s) Signed: 05/18/2023 10:42:34 AM By: Duanne Guess MD FACS Entered By: Duanne Guess on 05/18/2023 10:42:33 Kyle [name], Kyle [name] ([id number]) [id number] Page 7 of 9 -------------------------------------------------------------------------------- HxROS Details Patient Name: Date of Service: Kyle [name] Kyle [name]. 05/18/2023 10:15 [name] Medical Record Number: [id number] Patient Account Number: [id number] Date of Birth/Sex: Treating RN: 08-22-1955 (67 y.o. M) Primary Care Provider: Fara Chute Other Clinician: Referring Provider: Treating Provider/Extender: Garret Reddish in Treatment: 5 Information Obtained  From Patient Chart Constitutional Symptoms (General Health) Medical History: Past Medical History Notes: MSSA infection Eyes Medical History: Past Medical History Notes: Retinal vasculitis Ear/Nose/Mouth/Throat Medical History: Past Medical History Notes: Dysphagia Respiratory Medical History: Positive for: Sleep Apnea Gastrointestinal Medical History: Past Medical History Notes: GERD Endocrine Medical History: Positive for: Type II Diabetes Time with diabetes: 1994 Treated with: Insulin Blood sugar tested every day: Yes Tested : twice [name] day Musculoskeletal Medical History: Positive for: Osteomyelitis - of left ulna Past Medical History Notes: Polyarthritis Neurologic Medical History: Past Medical History Notes: Temporal arteritis, Occipital neuralgia of right side, NDPH (new daily persistent headache) Psychiatric Medical History: Past Medical History Notes: depression Immunizations Kyle [name], Kyle [name] ([id number]) [id number] Page 8 of 9 Pneumococcal Vaccine: Received Pneumococcal Vaccination: No Implantable Devices Yes Hospitalization / Surgery History Type of Hospitalization/Surgery Olecranon bursectomy (Left) Ulnar nerve transposition (Left) tooth removal spinal stimulator implanted Back surgery Femur surgery Cholecystectomy Family and Social History Cancer: Yes - Mother; Diabetes: No; Heart Disease: Yes - Father; Hereditary Spherocytosis: No; Hypertension: Yes - Father; Kidney Disease: No; Lung Disease: No; Seizures: No; Stroke: No; Thyroid Problems: No; Tuberculosis: No; Former smoker - quit in 1987; Marital Status - Married; Alcohol Use: Rarely; Drug Use: No History; Caffeine Use: Daily; Financial Concerns:  No; Food, Clothing or Shelter Needs: No; Support System Lacking: No; Transportation Concerns: No Electronic Signature(s) Signed: 05/18/2023 10:43:09 AM By: Duanne Guess MD FACS Entered By: Duanne Guess on  05/18/2023 10:39:15 -------------------------------------------------------------------------------- SuperBill Details Patient Name: Date of Service: Kyle [name]. 05/18/2023 Medical Record Number: [id number] Patient Account Number: [id number] Date of Birth/Sex: Treating RN: September 12, 1955 (67 y.o. 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 wit Modifier: hout evidence of nec Quantity: 1 rosis Physician Procedures : CPT4 Code Description Modifier [id number] - WC PHYS LEVEL 4 - EST PT ICD-10 Diagnosis Description 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

## 2023-05-22 ENCOUNTER — Encounter: Payer: Self-pay | Admitting: Neurology

## 2023-05-22 ENCOUNTER — Other Ambulatory Visit (HOSPITAL_COMMUNITY): Payer: Self-pay | Admitting: General Surgery

## 2023-05-22 DIAGNOSIS — E11621 Type 2 diabetes mellitus with foot ulcer: Secondary | ICD-10-CM

## 2023-05-24 NOTE — Addendum Note (Signed)
Addended by: Bertram Savin on: 05/24/2023 08:32 AM   Modules accepted: Orders

## 2023-06-04 ENCOUNTER — Encounter (HOSPITAL_BASED_OUTPATIENT_CLINIC_OR_DEPARTMENT_OTHER): Payer: Medicare PPO | Admitting: General Surgery

## 2023-06-04 DIAGNOSIS — L97516 Non-pressure chronic ulcer of other part of right foot with bone involvement without evidence of necrosis: Secondary | ICD-10-CM | POA: Diagnosis not present

## 2023-06-04 DIAGNOSIS — L97812 Non-pressure chronic ulcer of other part of right lower leg with fat layer exposed: Secondary | ICD-10-CM | POA: Diagnosis not present

## 2023-06-04 DIAGNOSIS — L97212 Non-pressure chronic ulcer of right calf with fat layer exposed: Secondary | ICD-10-CM | POA: Diagnosis not present

## 2023-06-04 DIAGNOSIS — E11621 Type 2 diabetes mellitus with foot ulcer: Secondary | ICD-10-CM | POA: Diagnosis not present

## 2023-06-04 DIAGNOSIS — E11622 Type 2 diabetes mellitus with other skin ulcer: Secondary | ICD-10-CM | POA: Diagnosis not present

## 2023-06-04 NOTE — Progress Notes (Signed)
Kyle [name], Kyle [name] ([id number]) [id number] Page 1 of 9 Visit Report for 06/04/2023 Arrival Information Details Patient Name: Date of Service: Kyle [name]. 06/04/2023 2:15 PM Medical Record Number: [id number] Patient Account Number: [id number] Date of Birth/Sex: Treating RN: [birth year] (67 y.o. [name] Primary Care Josiah Wojtaszek: Fara Chute Other Clinician: Referring Nyema Hachey: Treating Che Rachal/Extender: Garret Reddish in Treatment: 7 Visit Information History Since Last Visit Added or deleted any medications: No Patient Arrived: Ambulatory Any new allergies or adverse reactions: No Arrival Time: 13:55 Had [name] fall or experienced change in Yes Accompanied By: self activities of daily living that may affect Transfer Assistance: None risk of falls: Patient Identification Verified: Yes Signs or symptoms of abuse/neglect since last visito No Secondary Verification Process Completed: Yes Hospitalized since last visit: No Patient Requires Transmission-Based Precautions: No Implantable device outside of the clinic excluding No Patient Has Alerts: Yes cellular tissue based products placed in the center Patient Alerts: R ABI:  in clinic since last visit: Has Dressing in Place as Prescribed: Yes Pain Present Now: Yes Electronic Signature(s) Signed: 06/04/2023 4:12:41 PM By: Samuella Bruin Entered By: Samuella Bruin on 06/04/2023 10:56:22 -------------------------------------------------------------------------------- Encounter Discharge Information Details Patient Name: Date of Service: Kyle Face MA S [name]. 06/04/2023 2:15 PM Medical Record Number: [id number] Patient Account Number: [id number] Date of Birth/Sex: Treating RN: 1956-06-29 (67 y.o. [name] Primary Care Lauraann Missey: Fara Chute Other Clinician: Referring Tailey Top: Treating Sheila Ocasio/Extender: Garret Reddish in  Treatment: 7 Encounter Discharge Information Items Post Procedure Vitals Discharge Condition: Stable Temperature (F): 97.7 Ambulatory Status: Ambulatory Pulse (bpm): 65 Discharge Destination: Home Respiratory Rate (breaths/min): 16 Transportation: Private Auto Blood Pressure (mmHg): 153/84 Accompanied By: self Schedule Follow-up Appointment: Yes Clinical Summary of Care: Patient Declined Electronic Signature(s) Signed: 06/04/2023 4:12:41 PM By: Samuella Bruin Entered By: Samuella Bruin on 06/04/2023 11:25:30 Kyle [name] ([id number]) [id number] Page 2 of 9 -------------------------------------------------------------------------------- Lower Extremity Assessment Details Patient Name: Date of Service: Kyle [name]. 06/04/2023 2:15 PM Medical Record Number: [id number] Patient Account Number: [id number] Date of Birth/Sex: Treating RN: 1956/01/12 (67 y.o. [name] Primary Care Davionne Dowty: Fara Chute Other Clinician: Referring Kaiana Marion: Treating Alanya Vukelich/Extender: Garret Reddish in Treatment: 7 Edema Assessment Assessed: Kyra Searles: No] [Right: No] Edema: [Left: N] [Right: o] Calf Left: Right: Point of Measurement: From Medial Instep 33.5 cm Ankle Left: Right: Point of Measurement: From Medial Instep 20.3 cm Vascular Assessment Pulses: Dorsalis Pedis Palpable: [Right:Yes] Extremity colors, hair growth, and conditions: Extremity Color: [Right:Normal] Hair Growth on Extremity: [Right:No] Temperature of Extremity: [Right:Warm] Capillary Refill: [Right:< 3 seconds] Dependent Rubor: [Right:No No] Electronic Signature(s) Signed: 06/04/2023 4:12:41 PM By: Samuella Bruin Entered By: Samuella Bruin on 06/04/2023 11:00:01 -------------------------------------------------------------------------------- Multi Wound Chart Details Patient Name: Date of Service: Kyle Face MA S [name]. 06/04/2023 2:15  PM Medical Record Number: [id number] Patient Account Number: [id number] Date of Birth/Sex: Treating RN: 10-23-1955 (67 y.o. M) Primary Care Yasamin Karel: Fara Chute Other Clinician: Referring Melvin Marmo: Treating Audrinna Sherman/Extender: Garret Reddish in Treatment: 7 Vital Signs Height(in): 73 Pulse(bpm): 65 Weight(lbs): 175 Blood Pressure(mmHg): 153/84 Body Mass Index(BMI): 23.1 Temperature(F): 97.7 Respiratory Rate(breaths/min): 16 [1:Photos:] [[id number] Page 3 of 9] Right T Fourth oe Right, Posterior Lower Leg Right, Medial Lower Leg Wound Location: Trauma Trauma Trauma Wounding Event: Diabetic Wound/Ulcer of the Lower Diabetic Wound/Ulcer of the Lower Diabetic Wound/Ulcer of the Lower Primary Etiology: Extremity Extremity Extremity Sleep Apnea, Type II Diabetes, Sleep Apnea,  Type II Diabetes, Sleep Apnea, Type II Diabetes, Comorbid History: Osteomyelitis Osteomyelitis Osteomyelitis 02/28/2023 06/02/2023 06/02/2023 Date Acquired: 7 0 0 Weeks of Treatment: Healed - Epithelialized Open Open Wound Status: No No No Wound Recurrence: 0x0x0 2.7x1.1x0.1 0.5x0.4x0.1 Measurements L x W x D (cm) 0 2.333 0.157 [name] (cm) : rea 0 0.233 0.016 Volume (cm) : 100.00% N/[name] N/[name] % Reduction in [name] rea: 100.00% N/[name] N/[name] % Reduction in Volume: Grade 2 Grade 1 Grade 1 Classification: None Present Medium Medium Exudate [name] mount: N/[name] Serosanguineous Serosanguineous Exudate Type: N/[name] red, brown red, brown Exudate Color: Distinct, outline attached Distinct, outline attached Distinct, outline attached Wound Margin: None Present (0%) Small (1-33%) Small (1-33%) Granulation [name] mount: N/[name] Red Red Granulation Quality: None Present (0%) Large (67-100%) Large (67-100%) Necrotic [name] mount: N/[name] Eschar, Adherent Slough Adherent Slough Necrotic Tissue: Fascia: No Fat Layer (Subcutaneous Tissue): Yes Fat Layer (Subcutaneous Tissue): Yes Exposed  Structures: Fat Layer (Subcutaneous Tissue): No Fascia: No Fascia: No Tendon: No Tendon: No Tendon: No Muscle: No Muscle: No Muscle: No Joint: No Joint: No Joint: No Bone: No Bone: No Bone: No Large (67-100%) None None Epithelialization: N/[name] Debridement - Excisional Debridement - Excisional Debridement: Pre-procedure Verification/Time Out N/[name] 14:09 14:09 Taken: N/[name] Lidocaine 4% Topical Solution Lidocaine 4% Topical Solution Pain Control: N/[name] Necrotic/Eschar, Subcutaneous, Necrotic/Eschar, Subcutaneous, Tissue Debrided: [employer] N/[name] Skin/Subcutaneous Tissue Skin/Subcutaneous Tissue Level: N/[name] 1.87 0.16 Debridement [name] (sq cm): rea N/[name] Curette Curette Instrument: N/[name] Minimum Minimum Bleeding: N/[name] Pressure Pressure Hemostasis Achieved: N/[name] Procedure was tolerated well Procedure was tolerated well Debridement Treatment Response: N/[name] 2.7x1.1x0.1 0.5x0.4x0.1 Post Debridement Measurements L x W x D (cm) N/[name] 0.233 0.016 Post Debridement Volume: (cm) Callus: Yes No Abnormalities Noted No Abnormalities Noted Periwound Skin Texture: Maceration: No No Abnormalities Noted No Abnormalities Noted Periwound Skin Moisture: Rubor: Yes Rubor: Yes No Abnormalities Noted Periwound Skin Color: Erythema: No No Abnormality No Abnormality No Abnormality Temperature: N/[name] Debridement Debridement Procedures Performed: Treatment Notes Electronic Signature(s) Signed: 06/04/2023 2:14:10 PM By: Duanne Guess MD FACS Entered By: Duanne Guess on 06/04/2023 11:14:10 -------------------------------------------------------------------------------- Multi-Disciplinary Care Plan Details Patient Name: Date of Service: Kyle Face MA S [name]. 06/04/2023 2:15 PM Medical Record Number: [id number] Patient Account Number: [id number] Date of Birth/Sex: Treating RN: 04-Apr-1956 (67 y.o. [name] Primary Care Kelechi Orgeron: Fara Chute Other Clinician: RAQUAN, CARROL [name] ([id number])  [id number] Page 4 of 9 Referring Dewaun Kinzler: Treating Porschia Willbanks/Extender: Garret Reddish in Treatment: 7 Multidisciplinary Care Plan reviewed with physician Active Inactive Abuse / Safety / Falls / Self Care Management Nursing Diagnoses: Impaired physical mobility Knowledge deficit related to: safety; personal, health (wound), emergency Potential for falls Goals: Patient/caregiver will verbalize/demonstrate measures taken to improve the patient's personal safety Date Initiated: 04/10/2023 Target Resolution Date: 07/13/2023 Goal Status: Active Patient/caregiver will verbalize/demonstrate measures taken to prevent injury and/or falls Date Initiated: 04/10/2023 Target Resolution Date: 07/13/2023 Goal Status: Active Interventions: Assess fall risk on admission and as needed Assess impairment of mobility on admission and as needed per policy Notes: Electronic Signature(s) Signed: 06/04/2023 4:12:41 PM By: Samuella Bruin Entered By: Samuella Bruin on 06/04/2023 11:24:35 -------------------------------------------------------------------------------- Pain Assessment Details Patient Name: Date of Service: Kyle Face MA S [name]. 06/04/2023 2:15 PM Medical Record Number: [id number] Patient Account Number: [id number] Date of Birth/Sex: Treating RN: [birth year] (67 y.o. [name] Primary Care Yitty Roads: Fara Chute Other Clinician: Referring Desha Bitner: Treating Rossetta Kama/Extender: Garret Reddish in Treatment: 7 Active Problems Location of Pain Severity and Description  of Pain Patient Has Paino Yes Site Locations Pain Location: Pain in Ulcers Duration of the Pain. Constant / Intermittento Intermittent Rate the pain. Current Pain Level: 6 Character of Pain Describe the Pain: Mateusz, Rolls [name] ([id number]) [id number] Page 5 of 9 Pain Management and  Medication Current Pain Management: Medication: Yes Electronic Signature(s) Signed: 06/04/2023 4:12:41 PM By: Samuella Bruin Entered By: Samuella Bruin on 06/04/2023 10:56:53 -------------------------------------------------------------------------------- Patient/Caregiver Education Details Patient Name: Date of Service: Kyle Face MA S [name]. 11/18/2024andnbsp2:15 PM Medical Record Number: [id number] Patient Account Number: [id number] Date of Birth/Gender: Treating RN: March 11, 1956 (67 y.o. [name] Primary Care Physician: Fara Chute Other Clinician: Referring Physician: Treating Physician/Extender: Garret Reddish in Treatment: 7 Education Assessment Education Provided To: Patient Education Topics Provided Wound/Skin Impairment: Methods: Explain/Verbal Responses: Reinforcements needed, State content correctly Electronic Signature(s) Signed: 06/04/2023 4:12:41 PM By: Samuella Bruin Entered By: Samuella Bruin on 06/04/2023 11:24:46 -------------------------------------------------------------------------------- Wound Assessment Details Patient Name: Date of Service: Kyle Face MA S [name]. 06/04/2023 2:15 PM Medical Record Number: [id number] Patient Account Number: [id number] Date of Birth/Sex: Treating RN: [birth year] (67 y.o. [name] Primary Care Stewart Pimenta: Fara Chute Other Clinician: Referring Ciera Beckum: Treating Angelize Ryce/Extender: Garret Reddish in Treatment: 7 Wound Status Wound Number: 1 Primary Etiology: Diabetic Wound/Ulcer of the Lower Extremity Wound Location: Right T Fourth oe Wound Status: Healed - Epithelialized Wounding Event: Trauma Comorbid History: Sleep Apnea, Type II Diabetes, Osteomyelitis Date Acquired: 02/28/2023 Weeks Of Treatment: 7 Clustered Wound: No Photos Kyle [name], Kyle [name] ([id number]) [id number] Page 6 of 9 Wound Measurements Length:  (cm) Width: (cm) Depth: (cm) Area: (cm) Volume: (cm) 0 % Reduction in Area: 100% 0 % Reduction in Volume: 100% 0 Epithelialization: Large (67-100%) 0 Tunneling: No 0 Undermining: No Wound Description Classification: Grade 2 Wound Margin: Distinct, outline attached Exudate Amount: None Present Foul Odor After Cleansing: No Slough/Fibrino No Wound Bed Granulation Amount: None Present (0%) Exposed Structure Necrotic Amount: None Present (0%) Fascia Exposed: No Fat Layer (Subcutaneous Tissue) Exposed: No Tendon Exposed: No Muscle Exposed: No Joint Exposed: No Bone Exposed: No Periwound Skin Texture Texture Color No Abnormalities Noted: Yes No Abnormalities Noted: No Erythema: No Moisture Rubor: Yes No Abnormalities Noted: Yes Temperature / Pain Temperature: No Abnormality Electronic Signature(s) Signed: 06/04/2023 4:12:41 PM By: Samuella Bruin Entered By: Samuella Bruin on 06/04/2023 11:09:13 -------------------------------------------------------------------------------- Wound Assessment Details Patient Name: Date of Service: Kyle Face MA S [name]. 06/04/2023 2:15 PM Medical Record Number: [id number] Patient Account Number: [id number] Date of Birth/Sex: Treating RN: Nov 19, 1955 (67 y.o. [name] Primary Care Ariyanah Aguado: Fara Chute Other Clinician: Referring Drakkar Medeiros: Treating Nyazia Canevari/Extender: Garret Reddish in Treatment: 7 Wound Status Wound Number: 2 Primary Etiology: Diabetic Wound/Ulcer of the Lower Extremity Wound Location: Right, Posterior Lower Leg Wound Status: Open Wounding Event: Trauma Comorbid History: Sleep Apnea, Type II Diabetes, Osteomyelitis Date Acquired: 06/02/2023 Weeks Of Treatment: 0 Clustered Wound: No Photos Kyle [name], Kyle [name] ([id number]) [id number] Page 7 of 9 Wound Measurements Length: (cm) 2.7 Width: (cm) 1.1 Depth: (cm) 0.1 Area: (cm) 2.333 Volume: (cm) 0.233 %  Reduction in Area: % Reduction in Volume: Epithelialization: None Tunneling: No Undermining: No Wound Description Classification: Grade 1 Wound Margin: Distinct, outline attached Exudate Amount: Medium Exudate Type: Serosanguineous Exudate Color: red, brown Foul Odor After Cleansing: No Slough/Fibrino Yes Wound Bed Granulation Amount: Small (1-33%) Exposed Structure Granulation Quality: Red Fascia Exposed: No Necrotic Amount: Large (67-100%) Fat Layer (Subcutaneous Tissue) Exposed: Yes  Necrotic Quality: Eschar, Adherent Slough Tendon Exposed: No Muscle Exposed: No Joint Exposed: No Bone Exposed: No Periwound Skin Texture Texture Color No Abnormalities Noted: Yes No Abnormalities Noted: No Rubor: Yes Moisture No Abnormalities Noted: Yes Temperature / Pain Temperature: No Abnormality Treatment Notes Wound #2 (Lower Leg) Wound Laterality: Right, Posterior Cleanser Soap and Water Discharge Instruction: May shower and wash wound with dial antibacterial soap and water prior to dressing change. Wound Cleanser Discharge Instruction: Cleanse the wound with wound cleanser prior to applying [name] clean dressing using gauze sponges, not tissue or cotton balls. Peri-Wound Care Topical Primary Dressing Maxorb Extra Ag+ Alginate Dressing, 2x2 (in/in) Discharge Instruction: Apply to wound bed as instructed Secondary Dressing Zetuvit Plus Silicone Border Dressing 3x3 (in/in) Discharge Instruction: Apply silicone border over primary dressing as directed. Secured With Compression Wrap Compression Stockings Facilities manager) Signed: 06/04/2023 4:12:41 PM By: Jolayne Haines, Signed: 06/04/2023 4:12:41 PM By: Jan Fireman ([id number]) [id number] Page 8 of 9 aylor Entered By: Samuella Bruin on 06/04/2023 11:04:39 -------------------------------------------------------------------------------- Wound Assessment  Details Patient Name: Date of Service: Kyle [name]. 06/04/2023 2:15 PM Medical Record Number: [id number] Patient Account Number: [id number] Date of Birth/Sex: Treating RN: Nov 22, 1955 (67 y.o. [name] Primary Care Hanae Waiters: Fara Chute Other Clinician: Referring Yari Szeliga: Treating Myrl Lazarus/Extender: Garret Reddish in Treatment: 7 Wound Status Wound Number: 3 Primary Etiology: Diabetic Wound/Ulcer of the Lower Extremity Wound Location: Right, Medial Lower Leg Wound Status: Open Wounding Event: Trauma Comorbid History: Sleep Apnea, Type II Diabetes, Osteomyelitis Date Acquired: 06/02/2023 Weeks Of Treatment: 0 Clustered Wound: No Photos Wound Measurements Length: (cm) 0.5 Width: (cm) 0.4 Depth: (cm) 0.1 Area: (cm) 0.157 Volume: (cm) 0.016 % Reduction in Area: % Reduction in Volume: Epithelialization: None Tunneling: No Undermining: No Wound Description Classification: Grade 1 Wound Margin: Distinct, outline attached Exudate Amount: Medium Exudate Type: Serosanguineous Exudate Color: red, brown Foul Odor After Cleansing: No Slough/Fibrino Yes Wound Bed Granulation Amount: Small (1-33%) Exposed Structure Granulation Quality: Red Fascia Exposed: No Necrotic Amount: Large (67-100%) Fat Layer (Subcutaneous Tissue) Exposed: Yes Necrotic Quality: Adherent Slough Tendon Exposed: No Muscle Exposed: No Joint Exposed: No Bone Exposed: No Periwound Skin Texture Texture Color No Abnormalities Noted: Yes No Abnormalities Noted: Yes Moisture Temperature / Pain No Abnormalities Noted: Yes Temperature: No Abnormality Treatment Notes Wound #3 (Lower Leg) Wound Laterality: Right, Medial Kyle [name], Kyle [name] ([id number]) [id number] Page 9 of 9 Cleanser Soap and Water Discharge Instruction: May shower and wash wound with dial antibacterial soap and water prior to dressing change. Wound Cleanser Discharge  Instruction: Cleanse the wound with wound cleanser prior to applying [name] clean dressing using gauze sponges, not tissue or cotton balls. Peri-Wound Care Topical Primary Dressing Maxorb Extra Ag+ Alginate Dressing, 2x2 (in/in) Discharge Instruction: Apply to wound bed as instructed Secondary Dressing Zetuvit Plus Silicone Border Dressing 3x3 (in/in) Discharge Instruction: Apply silicone border over primary dressing as directed. Secured With Compression Wrap Compression Stockings Facilities manager) Signed: 06/04/2023 4:12:41 PM By: Samuella Bruin Entered By: Samuella Bruin on 06/04/2023 11:04:57 -------------------------------------------------------------------------------- Vitals Details Patient Name: Date of Service: Kyle Face MA S [name]. 06/04/2023 2:15 PM Medical Record Number: [id number] Patient Account Number: [id number] Date of Birth/Sex: Treating RN: [birth year] (67 y.o. [name] Primary Care Elizabethann Lackey: Fara Chute Other Clinician: Referring Jakob Kimberlin: Treating Rionna Feltes/Extender: Garret Reddish in Treatment: 7 Vital Signs Time Taken: 13:59 Temperature (F): 97.7 Height (in): 73 Pulse (bpm): 65 Weight (lbs): 175 Respiratory  Rate (breaths/min): 16 Body Mass Index (BMI): 23.1 Blood Pressure (mmHg): 153/84 Reference Range: 80 - 120 mg / dl Electronic Signature(s) Signed: 06/04/2023 4:12:41 PM By: Samuella Bruin Entered By: Samuella Bruin on 06/04/2023 10:59:24

## 2023-06-04 NOTE — Progress Notes (Signed)
RANGEL, CANNADA A ([id number]) [id number] Page 1 of 10 Visit Report for 06/04/2023 Chief Complaint Document Details Patient Name: Date of Service: Kyle [name] [place] S A. 06/04/2023 2:15 PM Medical Record Number: [id number] Patient Account Number: [id number] Date of Birth/Sex: Treating RN: 1955-11-09 (67 y.o. M) Primary Care Provider: Fara Chute Other Clinician: Referring Provider: Treating Provider/Extender: Garret Reddish in Treatment: 7 Information Obtained from: Patient Chief Complaint Patients presents for treatment of an open diabetic ulcer Electronic Signature(s) Signed: 06/04/2023 2:14:18 PM By: Duanne Guess MD FACS Entered By: Duanne Guess on 06/04/2023 11:14:18 -------------------------------------------------------------------------------- Debridement Details Patient Name: Date of Service: Kyle Face MA S A. 06/04/2023 2:15 PM Medical Record Number: [id number] Patient Account Number: [id number] Date of Birth/Sex: Treating RN: Jan 06, 1956 (67 y.o. [name] Primary Care Provider: Fara Chute Other Clinician: Referring Provider: Treating Provider/Extender: Garret Reddish in Treatment: 7 Debridement Performed for Assessment: Wound #2 Right,Posterior Lower Leg Performed By: Physician Duanne Guess, MD The following information was scribed by: Samuella Bruin The information was scribed for: Duanne Guess Debridement Type: Debridement Severity of Tissue Pre Debridement: Fat layer exposed Level of Consciousness (Pre-procedure): Awake and Alert Pre-procedure Verification/Time Out Yes - 14:09 Taken: Start Time: 14:09 Pain Control: Lidocaine 4% Topical Solution Percent of Wound Bed Debrided: 80% T Area Debrided (cm): otal 1.87 Tissue and other material debrided: Non-Viable, Eschar, Slough, Subcutaneous, Slough Level: Skin/Subcutaneous Tissue Debridement Description:  Excisional Instrument: Curette Bleeding: Minimum Hemostasis Achieved: Pressure Response to Treatment: Procedure was tolerated well Level of Consciousness (Post- Awake and Alert procedure): Post Debridement Measurements of Total Wound Length: (cm) 2.7 Width: (cm) 1.1 Depth: (cm) 0.1 Volume: (cm) 0.233 Character of Wound/Ulcer Post Debridement: Improved Severity of Tissue Post Debridement: Fat layer exposed GARCIA, CARRISALEZ A ([id number]) [id number] Page 2 of 10 Post Procedure Diagnosis Same as Pre-procedure Electronic Signature(s) Signed: 06/04/2023 2:32:33 PM By: Duanne Guess MD FACS Signed: 06/04/2023 4:12:41 PM By: Samuella Bruin Entered By: Samuella Bruin on 06/04/2023 11:10:10 -------------------------------------------------------------------------------- Debridement Details Patient Name: Date of Service: Kyle Face MA S A. 06/04/2023 2:15 PM Medical Record Number: [id number] Patient Account Number: [id number] Date of Birth/Sex: Treating RN: Jan 09, 1956 (67 y.o. [name] Primary Care Provider: Fara Chute Other Clinician: Referring Provider: Treating Provider/Extender: Garret Reddish in Treatment: 7 Debridement Performed for Assessment: Wound #3 Right,Medial Lower Leg Performed By: Physician Duanne Guess, MD The following information was scribed by: Samuella Bruin The information was scribed for: Duanne Guess Debridement Type: Debridement Severity of Tissue Pre Debridement: Fat layer exposed Level of Consciousness (Pre-procedure): Awake and Alert Pre-procedure Verification/Time Out Yes - 14:09 Taken: Start Time: 14:09 Pain Control: Lidocaine 4% Topical Solution Percent of Wound Bed Debrided: 100% T Area Debrided (cm): otal 0.16 Tissue and other material debrided: Non-Viable, Eschar, Slough, Subcutaneous, Slough Level: Skin/Subcutaneous Tissue Debridement Description:  Excisional Instrument: Curette Bleeding: Minimum Hemostasis Achieved: Pressure Response to Treatment: Procedure was tolerated well Level of Consciousness (Post- Awake and Alert procedure): Post Debridement Measurements of Total Wound Length: (cm) 0.5 Width: (cm) 0.4 Depth: (cm) 0.1 Volume: (cm) 0.016 Character of Wound/Ulcer Post Debridement: Improved Severity of Tissue Post Debridement: Fat layer exposed Post Procedure Diagnosis Same as Pre-procedure Electronic Signature(s) Signed: 06/04/2023 2:32:33 PM By: Duanne Guess MD FACS Signed: 06/04/2023 4:12:41 PM By: Samuella Bruin Entered By: Samuella Bruin on 06/04/2023 11:10:31 HPI Details -------------------------------------------------------------------------------- Lafe Garin ([id number]) [id number] Page 3 of 10 Patient Name: Date of Service: Kyle [name] [place] S A. 06/04/2023 2:15  PM Medical Record Number: [id number] Patient Account Number: [id number] Date of Birth/Sex: Treating RN: [birth year] (67 y.o. M) Primary Care Provider: Fara Chute Other Clinician: Referring Provider: Treating Provider/Extender: Garret Reddish in Treatment: 7 History of Present Illness HPI Description: ADMISSION 04/10/2023 ***ABI NON-COMPRESSIBLE; STRONG DOPPLER SIGNALS*** This is a 67 year old diabetic (no recent hemoglobin A1c available for review) who suffered a syncopal event in early September. He apparently injured his left fourth toe as well as had pain in his ankle. The went to see his doctor about a week after his event due to continued pain in his ankle. Apparently his PCP was more concerned about his toe and sent him to the ED for further evaluation due to concern for possible osteomyelitis. Examination imaging done in the emergency department showed a comminuted fracture of the middle phalanx of the fourth toe with minimal displacement. No other abnormalities  were appreciated. He was given a course of cephalexin and referred to the wound care center for further evaluation and management. They did put him in a postop shoe for his toe and an Ace bandage for the ankle. He has not been wearing the postop shoe due to gait instability and has been applying peroxide and Neosporin to his toe wound. 04/26/2023: The depth of the wound has filled in completely with granulation tissue. There is no slough accumulation, no malodor or purulent drainage. 05/07/2023: There is granulation tissue filling the wound, but when a probe is inserted, bone is still appreciated. There is a little bit of slough accumulation. 05/18/2023: The wound orifice has narrowed but the depth is about the same and still probes to bone. There is slough and eschar accumulation. His insurance has denied him for Grafix. 06/04/2023: The wound on his toe has healed. Unfortunately, he had an accident in his workshop and he has 3 new wounds on his right lower leg. There is one anteriorly and 2 posteriorly. They extend to the fat layer and each has a layer of slough on the surface. One of the posterior wounds is a little bit leathery, as well. Electronic Signature(s) Signed: 06/04/2023 2:25:57 PM By: Duanne Guess MD FACS Entered By: Duanne Guess on 06/04/2023 11:25:56 -------------------------------------------------------------------------------- Physical Exam Details Patient Name: Date of Service: Kyle Face MA S A. 06/04/2023 2:15 PM Medical Record Number: [id number] Patient Account Number: [id number] Date of Birth/Sex: Treating RN: 03/10/1956 (67 y.o. M) Primary Care Provider: Fara Chute Other Clinician: Referring Provider: Treating Provider/Extender: Garret Reddish in Treatment: 7 Constitutional Hypertensive, asymptomatic. . . . no acute distress. Respiratory Normal work of breathing on room air.. Notes 06/04/2023: The wound on his toe has healed.  Unfortunately, he had an accident in his workshop and he has 3 new wounds on his right lower leg. There is one anteriorly and 2 posteriorly. They extend to the fat layer and each has a layer of slough on the surface. One of the posterior wounds is a little bit leathery, as well. Electronic Signature(s) Signed: 06/04/2023 2:27:07 PM By: Duanne Guess MD FACS Entered By: Duanne Guess on 06/04/2023 11:27:06 PHYLLIP, ITZKOWITZ A ([id number]) [id number] Page 4 of 10 -------------------------------------------------------------------------------- Physician Orders Details Patient Name: Date of Service: Kyle [name] [place] S A. 06/04/2023 2:15 PM Medical Record Number: [id number] Patient Account Number: [id number] Date of Birth/Sex: Treating RN: 13-May-1956 (67 y.o. [name] Primary Care Provider: Fara Chute Other Clinician: Referring Provider: Treating Provider/Extender: Garret Reddish in Treatment: 7 The following information was scribed by:  Samuella Bruin The information was scribed for: Duanne Guess Verbal / Phone Orders: No Diagnosis Coding ICD-10 Coding Code Description [phone] Non-pressure chronic ulcer of other part of right lower leg with fat layer exposed E11.622 Type 2 diabetes mellitus with other skin ulcer Follow-up Appointments ppointment in 2 weeks. - Dr. Lady Gary RM 1 Return A Anesthetic (In clinic) Topical Lidocaine 4% applied to wound bed Bathing/ Shower/ Hygiene May shower and wash wound with soap and water. Wound Treatment Wound #2 - Lower Leg Wound Laterality: Right, Posterior Cleanser: Soap and Water 1 x Per Day/30 Days Discharge Instructions: May shower and wash wound with dial antibacterial soap and water prior to dressing change. Cleanser: Wound Cleanser 1 x Per Day/30 Days Discharge Instructions: Cleanse the wound with wound cleanser prior to applying a clean dressing using gauze sponges, not  tissue or cotton balls. Prim Dressing: Maxorb Extra Ag+ Alginate Dressing, 2x2 (in/in) 1 x Per Day/30 Days ary Discharge Instructions: Apply to wound bed as instructed Secondary Dressing: Zetuvit Plus Silicone Border Dressing 3x3 (in/in) 1 x Per Day/30 Days Discharge Instructions: Apply silicone border over primary dressing as directed. Wound #3 - Lower Leg Wound Laterality: Right, Medial Cleanser: Soap and Water 1 x Per Day/30 Days Discharge Instructions: May shower and wash wound with dial antibacterial soap and water prior to dressing change. Cleanser: Wound Cleanser 1 x Per Day/30 Days Discharge Instructions: Cleanse the wound with wound cleanser prior to applying a clean dressing using gauze sponges, not tissue or cotton balls. Prim Dressing: Maxorb Extra Ag+ Alginate Dressing, 2x2 (in/in) 1 x Per Day/30 Days ary Discharge Instructions: Apply to wound bed as instructed Secondary Dressing: Zetuvit Plus Silicone Border Dressing 3x3 (in/in) 1 x Per Day/30 Days Discharge Instructions: Apply silicone border over primary dressing as directed. Patient Medications llergies: aspirin, ibuprofen A Notifications Medication Indication Start End 06/04/2023 lidocaine DOSE topical 4 % cream - cream topical Electronic Signature(s) Signed: 06/04/2023 2:29:26 PM By: Duanne Guess MD FACS Previous Signature: 06/04/2023 2:27:18 PM Version By: Duanne Guess MD FACS Entered By: Duanne Guess on 06/04/2023 11:29:26 JASHUN, EMRICK A ([id number]) [id number] Page 5 of 10 -------------------------------------------------------------------------------- Problem List Details Patient Name: Date of Service: Kyle [name] [place] S A. 06/04/2023 2:15 PM Medical Record Number: [id number] Patient Account Number: [id number] Date of Birth/Sex: Treating RN: September 20, 1955 (67 y.o. 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 06/04/2023 2:15 PM Medical Record Number: [id number] Patient Account Number: [id number] Date of Birth/Sex: Treating RN: 1955-10-01 (67 y.o. M) Primary Care Provider: Fara Chute Other Clinician: Referring Provider: Treating Provider/Extender: Garret Reddish in Treatment: 7 Subjective Chief Complaint Information obtained from Patient Patients presents for treatment of an open diabetic ulcer History  of Present Illness (HPI) ADMISSION TAEVION, HOLTORF A ([id number]) [id number] Page 6 of 10 04/10/2023 ***ABI NON-COMPRESSIBLE; STRONG DOPPLER SIGNALS*** This is a 67 year old diabetic (no recent hemoglobin A1c available for review) who suffered a syncopal event in early September. He apparently injured his left fourth toe as well as had pain in his ankle. The went to see his doctor about a week after his event due to continued pain in his ankle. Apparently his PCP was more concerned  about his toe and sent him to the ED for further evaluation due to concern for possible osteomyelitis. Examination imaging done in the emergency department showed a comminuted fracture of the middle phalanx of the fourth toe with minimal displacement. No other abnormalities were appreciated. He was given a course of cephalexin and referred to the wound care center for further evaluation and management. They did put him in a postop shoe for his toe and an Ace bandage for the ankle. He has not been wearing the postop shoe due to gait instability and has been applying peroxide and Neosporin to his toe wound. 04/26/2023: The depth of the wound has filled in completely with granulation tissue. There is no slough accumulation, no malodor or purulent drainage. 05/07/2023: There is granulation tissue filling the wound, but when a probe is inserted, bone is still appreciated. There is a little bit of slough accumulation. 05/18/2023: The wound orifice has narrowed but the depth is about the same and still probes to bone. There is slough and eschar accumulation. His insurance has denied him for Grafix. 06/04/2023: The wound on his toe has healed. Unfortunately, he had an accident in his workshop and he has 3 new wounds on his right lower leg. There is one anteriorly and 2 posteriorly. They extend to the fat layer and each has a layer of slough on the surface. One of the posterior wounds is a little bit leathery, as well. Patient History Information obtained from Patient, Chart. Family History Cancer - Mother, Heart Disease - Father, Hypertension - Father, No family history of Diabetes, Hereditary Spherocytosis, Kidney Disease, Lung Disease, Seizures, Stroke, Thyroid Problems, Tuberculosis. Social History Former smoker - quit in 1987, Marital Status - Married, Alcohol Use - Rarely, Drug Use - No History, Caffeine Use - Daily. Medical History Respiratory Patient has history of Sleep Apnea Endocrine Patient  has history of Type II Diabetes Musculoskeletal Patient has history of Osteomyelitis - of left ulna Hospitalization/Surgery History - Olecranon bursectomy (Left). - Ulnar nerve transposition (Left). - tooth removal. - spinal stimulator implanted. - Back surgery. - Femur surgery. - Cholecystectomy. Medical A Surgical History Notes nd Constitutional Symptoms (General Health) MSSA infection Eyes Retinal vasculitis Ear/Nose/Mouth/Throat Dysphagia Gastrointestinal GERD Musculoskeletal Polyarthritis Neurologic Temporal arteritis, Occipital neuralgia of right side, NDPH (new daily persistent headache) Psychiatric depression Objective Constitutional Hypertensive, asymptomatic. no acute distress. Vitals Time Taken: 1:59 PM, Height: 73 in, Weight: 175 lbs, BMI: 23.1, Temperature: 97.7 F, Pulse: 65 bpm, Respiratory Rate: 16 breaths/min, Blood Pressure: 153/84 mmHg. Respiratory Normal work of breathing on room air.. General Notes: 06/04/2023: The wound on his toe has healed. Unfortunately, he had an accident in his workshop and he has 3 new wounds on his right lower leg. There is one anteriorly and 2 posteriorly. They extend to the fat layer and each has a layer of slough on the surface. One of the posterior wounds is a little bit leathery, as well. Integumentary (Hair, Skin) Wound #1 status is Healed - Epithelialized. Original cause of wound was Trauma.  The date acquired was: 02/28/2023. The wound has been in treatment 7 weeks. The wound is located on the Right T Fourth. The wound measures 0cm length x 0cm width x 0cm depth; 0cm^2 area and 0cm^3 volume. There is no tunneling oe or undermining noted. There is a none present amount of drainage noted. The wound margin is distinct with the outline attached to the wound base. There is no granulation within the wound bed. There is no necrotic tissue within the wound bed. The periwound skin appearance had no abnormalities noted for texture.  The periwound skin appearance had no abnormalities noted for moisture. The periwound skin appearance exhibited: Rubor. The periwound skin appearance did not ANIVAL, SALADO A ([id number]) [id number] Page 7 of 10 exhibit: Erythema. Periwound temperature was noted as No Abnormality. Wound #2 status is Open. Original cause of wound was Trauma. The date acquired was: 06/02/2023. The wound is located on the Right,Posterior Lower Leg. The wound measures 2.7cm length x 1.1cm width x 0.1cm depth; 2.333cm^2 area and 0.233cm^3 volume. There is Fat Layer (Subcutaneous Tissue) exposed. There is no tunneling or undermining noted. There is a medium amount of serosanguineous drainage noted. The wound margin is distinct with the outline attached to the wound base. There is small (1-33%) red granulation within the wound bed. There is a large (67-100%) amount of necrotic tissue within the wound bed including Eschar and Adherent Slough. The periwound skin appearance had no abnormalities noted for texture. The periwound skin appearance had no abnormalities noted for moisture. The periwound skin appearance exhibited: Rubor. Periwound temperature was noted as No Abnormality. Wound #3 status is Open. Original cause of wound was Trauma. The date acquired was: 06/02/2023. The wound is located on the Right,Medial Lower Leg. The wound measures 0.5cm length x 0.4cm width x 0.1cm depth; 0.157cm^2 area and 0.016cm^3 volume. There is Fat Layer (Subcutaneous Tissue) exposed. There is no tunneling or undermining noted. There is a medium amount of serosanguineous drainage noted. The wound margin is distinct with the outline attached to the wound base. There is small (1-33%) red granulation within the wound bed. There is a large (67-100%) amount of necrotic tissue within the wound bed including Adherent Slough. The periwound skin appearance had no abnormalities noted for texture. The periwound skin appearance  had no abnormalities noted for moisture. The periwound skin appearance had no abnormalities noted for color. Periwound temperature was noted as No Abnormality. 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 There was a Excisional Skin/Subcutaneous Tissue Debridement with a total area of 1.87 sq cm performed by Duanne Guess, MD. With the following instrument(s): Curette to remove Non-Viable tissue/material. Material removed includes Eschar, Subcutaneous Tissue, and Slough after achieving pain control using Lidocaine 4% T opical Solution. No specimens were taken. A time out was conducted at 14:09, prior to the start of the procedure. A Minimum amount of bleeding was controlled with Pressure. The procedure was tolerated well. Post Debridement Measurements: 2.7cm length x 1.1cm width x 0.1cm depth; 0.233cm^3 volume. Character of Wound/Ulcer Post Debridement is improved. Severity of Tissue Post Debridement is: Fat layer exposed. Post procedure Diagnosis Wound #2: Same as Pre-Procedure Wound #3 Pre-procedure diagnosis of Wound #3 is a Diabetic Wound/Ulcer of  the Lower Extremity located on the Right,Medial Lower Leg .Severity of Tissue Pre Debridement is: Fat layer exposed. There was a Excisional Skin/Subcutaneous Tissue Debridement with a total area of 0.16 sq cm performed by Duanne Guess, MD. With the following instrument(s): Curette to remove Non-Viable tissue/material. Material removed includes Eschar, Subcutaneous Tissue, and Slough after achieving pain control using Lidocaine 4% T opical Solution. No specimens were taken. A time out was conducted at 14:09, prior to the  start of the procedure. A Minimum amount of bleeding was controlled with Pressure. The procedure was tolerated well. Post Debridement Measurements: 0.5cm length x 0.4cm width x 0.1cm depth; 0.016cm^3 volume. Character of Wound/Ulcer Post Debridement is improved. Severity of Tissue Post Debridement is: Fat layer exposed. Post procedure Diagnosis Wound #3: Same as Pre-Procedure Plan Follow-up Appointments: Return Appointment in 2 weeks. - Dr. Lady Gary RM 1 Anesthetic: (In clinic) Topical Lidocaine 4% applied to wound bed Bathing/ Shower/ Hygiene: May shower and wash wound with soap and water. The following medication(s) was prescribed: lidocaine topical 4 % cream cream topical was prescribed at facility WOUND #2: - Lower Leg Wound Laterality: Right, Posterior Cleanser: Soap and Water 1 x Per Day/30 Days Discharge Instructions: May shower and wash wound with dial antibacterial soap and water prior to dressing change. Cleanser: Wound Cleanser 1 x Per Day/30 Days Discharge Instructions: Cleanse the wound with wound cleanser prior to applying a clean dressing using gauze sponges, not tissue or cotton balls. Prim Dressing: Maxorb Extra Ag+ Alginate Dressing, 2x2 (in/in) 1 x Per Day/30 Days ary Discharge Instructions: Apply to wound bed as instructed Secondary Dressing: Zetuvit Plus Silicone Border Dressing 3x3 (in/in) 1 x Per Day/30 Days Discharge Instructions: Apply silicone border over primary dressing as directed. WOUND #3: - Lower Leg Wound Laterality: Right, Medial Cleanser: Soap and Water 1 x Per Day/30 Days Discharge Instructions: May shower and wash wound with dial antibacterial soap and water prior to dressing change. Cleanser: Wound Cleanser 1 x Per Day/30 Days Discharge Instructions: Cleanse the wound with wound cleanser prior to applying a clean dressing using gauze sponges, not tissue or cotton balls. Prim Dressing: Maxorb Extra Ag+ Alginate Dressing, 2x2 (in/in) 1 x Per Day/30  Days ary Discharge Instructions: Apply to wound bed as instructed Secondary Dressing: Zetuvit Plus Silicone Border Dressing 3x3 (in/in) 1 x Per Day/30 Days Discharge Instructions: Apply silicone border over primary dressing as directed. ROODY, THEBO A ([id number]) [id number] Page 8 of 10 06/04/2023: The wound on his toe has healed. Unfortunately, he had an accident in his workshop and he has 3 new wounds on his right lower leg. There is one anteriorly and 2 posteriorly. They extend to the fat layer and each has a layer of slough on the surface. One of the posterior wounds is a little bit leathery, as well. I used a curette to debride slough, eschar, and subcutaneous tissue from each of the 3 new wounds. We will apply silver alginate and foam border dressings to each of these. He will follow-up in 2 weeks. Electronic Signature(s) Signed: 06/04/2023 2:29:37 PM By: Duanne Guess MD FACS Previous Signature: 06/04/2023 2:27:50 PM Version By: Duanne Guess MD FACS Entered By: Duanne Guess on 06/04/2023 11:29:36 -------------------------------------------------------------------------------- HxROS Details Patient Name: Date of Service: Kyle Face MA S A. 06/04/2023 2:15 PM Medical Record Number: [id number] Patient Account Number: [id number] Date of Birth/Sex: Treating RN: 1956/05/06 (67 y.o. M) Primary Care Provider: Fara Chute Other Clinician: Referring Provider: Treating Provider/Extender: Garret Reddish in Treatment:  7 Information Obtained From Patient Chart Constitutional Symptoms (General Health) Medical History: Past Medical History Notes: MSSA infection Eyes Medical History: Past Medical History Notes: Retinal vasculitis Ear/Nose/Mouth/Throat Medical History: Past Medical History Notes: Dysphagia Respiratory Medical History: Positive for: Sleep Apnea Gastrointestinal Medical History: Past Medical History  Notes: GERD Endocrine Medical History: Positive for: Type II Diabetes Time with diabetes: 1994 Treated with: Insulin Blood sugar tested every day: Yes Tested : twice a day Musculoskeletal Medical History: Positive for: Osteomyelitis - of left ulna MARDEN, TUNG A ([id number]) [id number] Page 9 of 10 Past Medical History Notes: Polyarthritis Neurologic Medical History: Past Medical History Notes: Temporal arteritis, Occipital neuralgia of right side, NDPH (new daily persistent headache) Psychiatric Medical History: Past Medical History Notes: depression Immunizations Pneumococcal Vaccine: Received Pneumococcal Vaccination: No Implantable Devices Yes Hospitalization / Surgery History Type of Hospitalization/Surgery Olecranon bursectomy (Left) Ulnar nerve transposition (Left) tooth removal spinal stimulator implanted Back surgery Femur surgery Cholecystectomy Family and Social History Cancer: Yes - Mother; Diabetes: No; Heart Disease: Yes - Father; Hereditary Spherocytosis: No; Hypertension: Yes - Father; Kidney Disease: No; Lung Disease: No; Seizures: No; Stroke: No; Thyroid Problems: No; Tuberculosis: No; Former smoker - quit in 1987; Marital Status - Married; Alcohol Use: Rarely; Drug Use: No History; Caffeine Use: Daily; Financial Concerns: No; Food, Clothing or Shelter Needs: No; Support System Lacking: No; Transportation Concerns: No Electronic Signature(s) Signed: 06/04/2023 2:32:33 PM By: Duanne Guess MD FACS Entered By: Duanne Guess on 06/04/2023 11:26:35 -------------------------------------------------------------------------------- SuperBill Details Patient Name: Date of Service: Kyle Face MA S A. 06/04/2023 Medical Record Number: [id number] Patient Account Number: [id number] Date of Birth/Sex: Treating RN: 22-Mar-1956 (67 y.o. M) Primary Care Provider: Fara Chute Other Clinician: Referring Provider: Treating  Provider/Extender: Garret Reddish in Treatment: 7 Diagnosis Coding ICD-10 Codes Code Description [phone] Non-pressure chronic ulcer of other part of right lower leg with fat layer exposed E11.622 Type 2 diabetes mellitus with other skin ulcer Facility Procedures : WAYLON, VANDRUFF Code: [id number] 1 HOMAS A ([id number]) Description: 1042 - DEB SUBQ TISSUE 20 SQ CM/< ICD-10 Diagnosis Description L97.812 Non-pressure chronic ulcer of other part of right lower leg with fat layer exp [id number] Modifier: osed [email or web address] Quantity: 1 df Page 10 of 10 Physician Procedures : CPT4 Code Description Modifier [id number] - WC PHYS LEVEL 4 - EST PT ICD-10 Diagnosis Description L97.812 Non-pressure chronic ulcer of other part of right lower leg with fat layer exposed E11.622 Type 2 diabetes mellitus with other skin ulcer Quantity: 1 : [id number] 11042 - WC PHYS SUBQ TISS 20 SQ CM ICD-10 Diagnosis Description L97.812 Non-pressure chronic ulcer of other part of right lower leg with fat layer exposed Quantity: 1 Electronic Signature(s) Signed: 06/04/2023 2:29:53 PM By: Duanne Guess MD FACS Entered By: Duanne Guess on 06/04/2023 11:29:53

## 2023-06-07 DIAGNOSIS — E1140 Type 2 diabetes mellitus with diabetic neuropathy, unspecified: Secondary | ICD-10-CM | POA: Diagnosis not present

## 2023-06-07 DIAGNOSIS — E1169 Type 2 diabetes mellitus with other specified complication: Secondary | ICD-10-CM | POA: Diagnosis not present

## 2023-06-07 DIAGNOSIS — E559 Vitamin D deficiency, unspecified: Secondary | ICD-10-CM | POA: Diagnosis not present

## 2023-06-07 DIAGNOSIS — E7849 Other hyperlipidemia: Secondary | ICD-10-CM | POA: Diagnosis not present

## 2023-06-07 DIAGNOSIS — Z1329 Encounter for screening for other suspected endocrine disorder: Secondary | ICD-10-CM | POA: Diagnosis not present

## 2023-06-07 DIAGNOSIS — E7801 Familial hypercholesterolemia: Secondary | ICD-10-CM | POA: Diagnosis not present

## 2023-06-07 DIAGNOSIS — E1165 Type 2 diabetes mellitus with hyperglycemia: Secondary | ICD-10-CM | POA: Diagnosis not present

## 2023-06-13 DIAGNOSIS — G43909 Migraine, unspecified, not intractable, without status migrainosus: Secondary | ICD-10-CM | POA: Diagnosis not present

## 2023-06-13 DIAGNOSIS — R634 Abnormal weight loss: Secondary | ICD-10-CM | POA: Diagnosis not present

## 2023-06-13 DIAGNOSIS — R030 Elevated blood-pressure reading, without diagnosis of hypertension: Secondary | ICD-10-CM | POA: Diagnosis not present

## 2023-06-13 DIAGNOSIS — R109 Unspecified abdominal pain: Secondary | ICD-10-CM | POA: Diagnosis not present

## 2023-06-13 DIAGNOSIS — E1165 Type 2 diabetes mellitus with hyperglycemia: Secondary | ICD-10-CM | POA: Diagnosis not present

## 2023-06-13 DIAGNOSIS — R110 Nausea: Secondary | ICD-10-CM | POA: Diagnosis not present

## 2023-06-13 DIAGNOSIS — H547 Unspecified visual loss: Secondary | ICD-10-CM | POA: Diagnosis not present

## 2023-06-13 DIAGNOSIS — Z23 Encounter for immunization: Secondary | ICD-10-CM | POA: Diagnosis not present

## 2023-06-13 DIAGNOSIS — Z6823 Body mass index (BMI) 23.0-23.9, adult: Secondary | ICD-10-CM | POA: Diagnosis not present

## 2023-06-19 ENCOUNTER — Ambulatory Visit (HOSPITAL_BASED_OUTPATIENT_CLINIC_OR_DEPARTMENT_OTHER): Payer: Medicare PPO | Admitting: Internal Medicine

## 2023-06-20 ENCOUNTER — Ambulatory Visit: Payer: Medicare PPO | Admitting: Internal Medicine

## 2023-06-25 ENCOUNTER — Other Ambulatory Visit: Payer: Self-pay

## 2023-06-26 NOTE — Progress Notes (Unsigned)
   Virtual Visit via Video Note  I connected with Kyle [name] on 06/27/23 at 12:45 PM EST by a video enabled telemedicine application and verified that I am speaking with the correct person using two identifiers.  Location: Patient: at his home Provider: in the office    I discussed the limitations of evaluation and management by telemedicine and the availability of in person appointments. The patient expressed understanding and agreed to proceed.  History of Present Illness: Today, June 27, 2023 SS: Has been on Vyepti since 01/08/23 100 mg every 3 months.  Last infusion was 04/02/2023.  He has done excellent with this.  He did not have any migraines until the medication had worn off about 2 weeks before his next infusion.  He has been quite pleased!!  According to his wife, he is also been taking Qulipta 60 mg daily.  Does not take anything for migraine rescue, nervous about interacting medications.  11/22/22 Dr. Lucia Gaskins: He has 15 migraine days a month and >15 total headache days a month for last 3 months or longer. Tried: topamax, depakote, metoprolol, amitriptyline, nortriptyline, ajovy, emgality, aimovig, ubrelvy,nurtec, cymbalta, celebrex, efffexor, steroids,gabapentin,toradol,mobic,nerve blocks, verapamil, candesartan, tizanidine,trazodone. The only medication that has given him some relief is qulipta. Try Vyepti.    Observations/Objective: Via video visit, is alert and oriented, speech is clear and concise, facial symmetry noted, moves about freely and independently.  Assessment and Plan: 1.  Chronic migraine headache  -Continue Vyepti 100 mg infusion every 3 months for migraine preventative -Has had excellent benefit!!  0 migraines up until Vyepti wears off about 2 weeks before next infusion cycle. -He will stop Qulipta since Vyepti is doing so well.  If needed, we can increase dose of Vyepti to 300 mg. I think his patient assistance runs out end of the year.  -Tried and failed:  Topamax, depakote, metoprolol, amitriptyline, nortriptyline, ajovy, emgality, aimovig, ubrelvy,nurtec, cymbalta, celebrex, efffexor, steroids,gabapentin,toradol,mobic,nerve blocks, verapamil, candesartan, tizanidine,trazodone.   Follow Up Instructions: 1 year, will schedule when they come in for infusion hopefully next week, can see me or Dr. Lucia Gaskins    I discussed the assessment and treatment plan with the patient. The patient was provided an opportunity to ask questions and all were answered. The patient agreed with the plan and demonstrated an understanding of the instructions.   The patient was advised to call back or seek an in-person evaluation if the symptoms worsen or if the condition fails to improve as anticipated.  Otila Kluver, DNP  [facility] [address] [place], [place] [address] [phone]

## 2023-06-27 ENCOUNTER — Telehealth (INDEPENDENT_AMBULATORY_CARE_PROVIDER_SITE_OTHER): Payer: Medicare PPO | Admitting: Neurology

## 2023-06-27 DIAGNOSIS — G43709 Chronic migraine without aura, not intractable, without status migrainosus: Secondary | ICD-10-CM

## 2023-06-27 NOTE — Patient Instructions (Addendum)
We will continue Vypeti infusion for migraine prevention every 3 months, you can stop the Qulipta. Let's see how things go on single agent Vyepti alone. Keep [place] posted. Thanks!!

## 2023-07-04 DIAGNOSIS — G43009 Migraine without aura, not intractable, without status migrainosus: Secondary | ICD-10-CM | POA: Diagnosis not present

## 2023-07-04 DIAGNOSIS — G43001 Migraine without aura, not intractable, with status migrainosus: Secondary | ICD-10-CM | POA: Diagnosis not present

## 2023-07-05 ENCOUNTER — Ambulatory Visit: Payer: Medicare PPO | Admitting: Internal Medicine

## 2023-08-16 ENCOUNTER — Encounter: Payer: Self-pay | Admitting: Neurology

## 2023-08-16 NOTE — Telephone Encounter (Signed)
Application printed and will be completed.

## 2023-08-29 ENCOUNTER — Telehealth: Payer: Self-pay

## 2023-08-29 NOTE — Telephone Encounter (Signed)
Faxes received from [facility] states patient has been approved for is Rx Vyepti from 08/29/23 - 07/16/24.

## 2023-09-06 ENCOUNTER — Encounter: Payer: Self-pay | Admitting: Neurology

## 2023-09-18 NOTE — Telephone Encounter (Signed)
 Message sent to [facility] w/ [facility]

## 2023-12-14 ENCOUNTER — Other Ambulatory Visit (HOSPITAL_BASED_OUTPATIENT_CLINIC_OR_DEPARTMENT_OTHER): Payer: Self-pay

## 2024-02-25 ENCOUNTER — Ambulatory Visit (HOSPITAL_BASED_OUTPATIENT_CLINIC_OR_DEPARTMENT_OTHER): Admitting: General Surgery

## 2024-02-28 ENCOUNTER — Ambulatory Visit (HOSPITAL_BASED_OUTPATIENT_CLINIC_OR_DEPARTMENT_OTHER): Admitting: General Surgery

## 2024-04-22 NOTE — Telephone Encounter (Signed)
 Called and spoke with patient to reschedule his appointment with Dr Ines 11/6. He stated he would like to speak with his wife first and will give us  a call back to r/s  If patient calls back, he can be rescheduled with Dr Gregg

## 2024-05-20 ENCOUNTER — Telehealth: Payer: Self-pay | Admitting: Neurology

## 2024-05-20 NOTE — Telephone Encounter (Signed)
 Pt wife called to request to speak to Nurse .  Pt wife would lie to speak to Nurse Heather if possible to go over some things

## 2024-05-21 NOTE — Telephone Encounter (Signed)
 Per [facility] w/ [facility]. A new order is needed for Vyepti  from new provider. Will get this ready for Dr Gregg to review and approve.

## 2024-05-21 NOTE — Telephone Encounter (Signed)
 Called wife Reena (on [place]) back and LVM informing her I was returning her call and we also were going to schedule patient with Dr Gregg. When she calls back we can do that.

## 2024-05-21 NOTE — Telephone Encounter (Signed)
 Pt's wife returned call. Her questions were answered and she was transferred to [facility] to schedule pt with Dr Gregg.

## 2024-05-22 ENCOUNTER — Ambulatory Visit: Admitting: Neurology

## 2024-05-26 ENCOUNTER — Other Ambulatory Visit (HOSPITAL_BASED_OUTPATIENT_CLINIC_OR_DEPARTMENT_OTHER): Payer: Self-pay

## 2024-05-27 NOTE — Telephone Encounter (Signed)
 LVM for patient (ok per DPR) asking for call back or mychart message to let us  know his most recent height and weight for the Vyepti  order.

## 2024-05-28 ENCOUNTER — Encounter: Payer: Self-pay | Admitting: *Deleted

## 2024-05-28 NOTE — Telephone Encounter (Signed)
 Spoke with pt's wife. She said pt is 6'1 and 178 lb. [facility] order updated and is pending Dr Janean signature. Chart updated.

## 2024-05-28 NOTE — Progress Notes (Signed)
 Updated pt's height & weight per wife's report (see phone note).

## 2024-05-28 NOTE — Telephone Encounter (Signed)
 Dr Gregg signed order. Order given to [facility] w/ [facility].

## 2024-07-23 ENCOUNTER — Ambulatory Visit: Admitting: Neurology

## 2024-07-23 ENCOUNTER — Encounter: Payer: Self-pay | Admitting: Neurology

## 2024-07-23 VITALS — BP 141/89 | HR 66 | Ht 73.0 in | Wt 170.0 lb

## 2024-07-23 DIAGNOSIS — G43709 Chronic migraine without aura, not intractable, without status migrainosus: Secondary | ICD-10-CM | POA: Diagnosis not present

## 2024-07-23 DIAGNOSIS — R519 Headache, unspecified: Secondary | ICD-10-CM

## 2024-07-23 NOTE — Progress Notes (Signed)
 "   [facility]  PATIENT: Kyle [name] DOB: [birth year]  REQUESTING CLINICIAN: Trudy Vaughn FALCON, MD HISTORY FROM: Patient and spouse  REASON FOR VISIT: Migraines follow up   HISTORICAL  CHIEF COMPLAINT:  Chief Complaint  Patient presents with   Migraine    Rm 13 with spouse  Pt is well, reports he is tolerating infusions well. He has a slight daily headache, has not had a migraine in months. No new concerns.     HISTORY OF PRESENT ILLNESS:  Discussed the use of AI scribe software for clinical note transcription with the patient, who gave verbal consent to proceed.  Kyle [name] is a 69 year old male with history of giant cell arteritis (follow with [facility]) who presents for a follow-up regarding his migraine headaches  These headaches are managed with Vyepti  infusions every three months, which have significantly improved his symptoms and kept them stable.  He has not had any migraine for the past 3 months.  He continued to have abdominal right temporal headache but stated his headache is manageable.  He feels that the headache is related to possibly his history of giant cell arteritis or maybe previous history of trauma (question injury)  History of Present Illness: Today, June 27, 2023 SS: Has been on Vyepti  since 01/08/23 100 mg every 3 months.  Last infusion was 04/02/2023.  He has done excellent with this.  He did not have any migraines until the medication had worn off about 2 weeks before his next infusion.  He has been quite pleased!!  According to his wife, he is also been taking Qulipta  60 mg daily.  Does not take anything for migraine rescue, nervous about interacting medications.   11/22/22 Dr. Ines: He has 15 migraine days a month and >15 total headache days a month for last 3 months or longer. Tried: topamax, depakote , metoprolol, amitriptyline, nortriptyline, ajovy, emgality, aimovig, ubrelvy,nurtec, cymbalta, celebrex , efffexor,  steroids,gabapentin ,toradol ,mobic,nerve blocks, verapamil, candesartan, tizanidine,trazodone. The only medication that has given him some relief is qulipta . Try Vyepti .   OTHER MEDICAL CONDITIONS: Chronic migraine headaches, generalized arthritis, cataracts   REVIEW OF SYSTEMS: Full 14 system review of systems performed and negative with exception of: As noted in HPI  ALLERGIES: Allergies[1]  HOME MEDICATIONS: Outpatient Medications Prior to Visit  Medication Sig Dispense Refill   ACTEMRA ACTPEN 162 MG/0.9ML SOAJ Inject into the skin once a week.     BD INSULIN SYRINGE U/F 31G X 5/16 1 ML MISC      buPROPion (WELLBUTRIN XL) 150 MG 24 hr tablet Take 450 mg by mouth at bedtime.     DULoxetine (CYMBALTA) 20 MG capsule Take 20 mg by mouth at bedtime.     Eptinezumab -jjmr (VYEPTI  IV) Inject 100 mg into the vein every 3 (three) months.     gabapentin  (NEURONTIN ) 300 MG capsule Take 2 capsules (600 mg total) by mouth 3 (three) times daily. 540 capsule 4   glipiZIDE (GLUCOTROL XL) 10 MG 24 hr tablet Take 10 mg by mouth 2 (two) times daily.     insulin glargine (LANTUS) 100 UNIT/ML injection Inject 20 Units into the skin 2 (two) times daily.     naloxone (NARCAN) 4 MG/0.1ML LIQD nasal spray kit Place into the nose.     oxyCODONE -acetaminophen  (PERCOCET/ROXICET) 5-325 MG tablet Take 2 tablets by mouth 3 (three) times daily as needed.      tiZANidine (ZANAFLEX) 2 MG tablet Take 2 mg by mouth 2 (two) times a day.  trazodone (DESYREL) 300 MG tablet Take 300 mg by mouth at bedtime.     Atogepant  (QULIPTA ) 60 MG TABS Take 60 mg by mouth daily. (Patient not taking: Reported on 07/23/2024) 30 tablet 11   Calcium Carbonate Antacid (TUMS PO) Take by mouth as needed. (Patient not taking: Reported on 07/23/2024)     cephALEXin  (KEFLEX ) 500 MG capsule Take 1 capsule (500 mg total) by mouth 4 (four) times daily. (Patient not taking: Reported on 07/23/2024) 28 capsule 0   Facility-Administered Medications Prior  to Visit  Medication Dose Route Frequency Provider Last Rate Last Admin   bupivacaine  (MARCAINE ) 0.5 % (with pres) injection 18 mL  18 mL Infiltration Once Ahern, Antonia B, MD       bupivacaine  (MARCAINE ) 0.5 % (with pres) injection 18 mL  18 mL Infiltration Once Ines Onetha NOVAK, MD       bupivacaine  (MARCAINE ) 0.5 % (with pres) injection 9 mL  9 mL Infiltration Once Ines Onetha NOVAK, MD       bupivacaine  (MARCAINE ) 0.5 % (with pres) injection 9 mL  9 mL Infiltration Once Ines Onetha NOVAK, MD       lidocaine  (XYLOCAINE ) 2 % (with pres) injection 60 mg  3 mL Intradermal Once Ines Onetha NOVAK, MD       lidocaine  (XYLOCAINE ) 2 % (with pres) injection 60 mg  3 mL Intradermal Once Ines Onetha NOVAK, MD       lidocaine  (XYLOCAINE ) 2 % (with pres) injection 60 mg  3 mL Intradermal Once Ines Onetha NOVAK, MD        PAST MEDICAL HISTORY: Past Medical History:  Diagnosis Date   Anxiety    Arm injury 2021   torn tendon and ligament in R arm; pending surgery    Depression    Diabetes (HCC)    Dysphagia 10/11/2020   GERD (gastroesophageal reflux disease) 10/11/2020   High cholesterol    Infectious diarrhea 03/10/2021   Left leg weakness 06/14/2020   Left-sided weakness 07/08/2020   Lower back pain 07/29/2020   Migraine    MSSA (methicillin susceptible Staphylococcus aureus) infection 05/17/2020   Nerve damage 1998   neck/right shoulder; machinery fell on pt at work; 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 Under care of [facility] Bakhit in [place] for pain management since 2000   OSA (obstructive sleep apnea)    does not use CPAP   Osteomyelitis of left ulna (HCC) 05/17/2020   Polyarthritis 03/15/2021   Retinal vasculitis 10/11/2020    PAST SURGICAL HISTORY: Past Surgical History:  Procedure Laterality Date   BACK SURGERY  1985   CHOLECYSTECTOMY     2010?   FEMUR SURGERY  1972   OLECRANON BURSECTOMY Left 05/05/2020   Procedure: OLECRANON BURSECTOMY DEBRIDEMENT TRICEP, SEPTIC ELBOW IRRIGATION AND  DEBRIDEMENT DEBRIDEMENT OF OSTEOMYLELITIS PROXIMAL ULNA;  Surgeon: Cristy Bonner DASEN, MD;  Location: [facility];  Service: Orthopedics;  Laterality: Left;   spinal stimulator implanted  2001   spinal stimulator replaced  2011   teeth removal  2020   2   tooth removal  2020   ULNAR NERVE TRANSPOSITION Left 05/05/2020   Procedure: ULNAR NERVE DECOMPRESSION/TRANSPOSITION;  Surgeon: Cristy Bonner DASEN, MD;  Location: [facility];  Service: Orthopedics;  Laterality: Left;    FAMILY HISTORY: Family History  Problem Relation Age of Onset   Brain cancer Mother    Heart disease Father    Heart attack Father    Diabetes Maternal Grandfather  Cancer Paternal Grandmother     SOCIAL HISTORY: Social History   Socioeconomic History   Marital status: Married    Spouse name: Bj Morlock   Number of children: 2   Years of education: 12   Highest education level: Not on file  Occupational History   Occupation: disabled  Tobacco Use   Smoking status: Former    Current packs/day: 0.00    Types: Cigarettes    Quit date: 1987    Years since quitting: 39.0   Smokeless tobacco: Never   Tobacco comments:    3-4 per day  Vaping Use   Vaping status: Never Used  Substance and Sexual Activity   Alcohol use: Not Currently    Comment: occasionally   Drug use: Never   Sexual activity: Not on file  Other Topics Concern   Not on file  Social History Narrative   Lives at home with his wife   Right handed   Caffeine: 1-2 daily   Social Drivers of Health   Tobacco Use: Medium Risk (07/23/2024)   Patient History    Smoking Tobacco Use: Former    Smokeless Tobacco Use: Never    Passive Exposure: Not on [place] Strain: Not on file  Food Insecurity: Not on file  Transportation Needs: No Transportation Needs (10/30/2022)   Received from [facility]   PRAPARE - Transportation    Lack of Transportation (Medical): No    Lack of Transportation  (Non-Medical): No  Physical Activity: Not on file  Stress: Not on file  Social Connections: Not on file  Intimate Partner Violence: Not on file  Depression (EYV7-0): Not on file  Alcohol Screen: Not on file  Housing: Not on file  Utilities: Not on file  Health Literacy: Low Risk (10/30/2022)   Received from [facility]    How often do you need to have someone help you when you read instructions, pamphlets, or other written material from your doctor or pharmacy?: Never    PHYSICAL EXAM  GENERAL EXAM/CONSTITUTIONAL: Vitals:  Vitals:   07/23/24 1313  BP: (!) 141/89  Pulse: 66  Weight: 170 lb (77.1 kg)  Height: 6' 1 (1.854 m)   Body mass index is 22.43 kg/m. Wt Readings from Last 3 Encounters:  07/23/24 170 lb (77.1 kg)  05/28/24 178 lb (80.7 kg)  03/31/23 178 lb (80.7 kg)   Patient is in no distress; well developed, nourished and groomed; neck is supple  MUSCULOSKELETAL: Gait, strength, tone, movements noted in Neurologic exam below  NEUROLOGIC: MENTAL STATUS:      No data to display         awake, alert, oriented to person, place and time recent and remote memory intact normal attention and concentration language fluent, comprehension intact, naming intact fund of knowledge appropriate  CRANIAL NERVE:  2nd, 3rd, 4th, 6th - visual fields full to confrontation with the left eye. Right eye blindness, extraocular muscles intact, no nystagmus 5th - facial sensation symmetric 7th - facial strength symmetric 8th - hearing intact 9th - palate elevates symmetrically, uvula midline 11th - shoulder shrug symmetric 12th - tongue protrusion midline  MOTOR:  normal bulk and tone, full strength in the BUE, BLE  COORDINATION:  finger-nose-finger, fine finger movements normal  GAIT/STATION:  normal   DIAGNOSTIC DATA (LABS, IMAGING, TESTING) - I reviewed patient records, labs, notes, testing and imaging myself where available.  Lab Results   Component Value Date   WBC  8.8 03/31/2023   HGB 15.4 03/31/2023   HCT 45.9 03/31/2023   MCV 91.4 03/31/2023   PLT 327 03/31/2023      Component Value Date/Time   NA 140 03/31/2023 1500   NA 136 11/28/2018 1143   K 4.6 03/31/2023 1500   CL 98 03/31/2023 1500   CO2 28 03/31/2023 1500   GLUCOSE 227 (H) 03/31/2023 1500   BUN 12 03/31/2023 1500   BUN 11 11/28/2018 1143   CREATININE 1.06 03/31/2023 1500   CREATININE 1.24 03/10/2021 1701   CALCIUM 9.7 03/31/2023 1500   PROT 6.7 03/31/2023 1500   PROT 7.2 11/28/2018 1143   ALBUMIN 3.8 03/31/2023 1500   ALBUMIN 4.6 11/28/2018 1143   AST 23 03/31/2023 1500   ALT 29 03/31/2023 1500   ALKPHOS 71 03/31/2023 1500   BILITOT 0.8 03/31/2023 1500   BILITOT 0.3 11/28/2018 1143   GFRNONAA >60 03/31/2023 1500   GFRNONAA 72 10/11/2020 1441   GFRAA 84 10/11/2020 1441   No results found for: CHOL, HDL, LDLCALC, LDLDIRECT, TRIG, CHOLHDL No results found for: YHAJ8R No results found for: VITAMINB12 Lab Results  Component Value Date   TSH 2.200 11/28/2018      ASSESSMENT AND PLAN  69 y.o. year old male with   Chronic migraine Chronic migraines have been well-managed with Vyepti  infusions every three months.  - Continue Vyepti  infusions every three months. - Ensure annual follow-up visit for infusion management.  - If needed, we can increase dose of Vyepti  to 300 mg.  -Tried and failed: Topamax, depakote , metoprolol, amitriptyline, nortriptyline, ajovy, emgality, aimovig, ubrelvy,nurtec, cymbalta, celebrex , efffexor, steroids,gabapentin ,toradol ,mobic,nerve blocks, verapamil, candesartan, tizanidine,trazodone, qulipta .     1. Chronic migraine w/o aura w/o status migrainosus, not intractable   2. Right sided temporal headache     Patient Instructions  Will continue with Vyepti  infusion  Continue your other medications  Return in a year or sooner if worse    No orders of the defined types were placed in this  encounter.   No orders of the defined types were placed in this encounter.   Return in about 1 year (around 07/23/2025).    Pastor Falling, MD 07/23/2024, 1:47 PM  [facility] [address], [address] [place], [place] [address] [phone]          [1]  Allergies Allergen Reactions   Aspirin Anaphylaxis   Ibuprofen Hives and Swelling   "

## 2024-07-23 NOTE — Patient Instructions (Addendum)
 Will continue with Vyepti  infusion  Continue your other medications  Return in a year or sooner if worse

## 2024-08-06 ENCOUNTER — Telehealth: Payer: Self-pay | Admitting: Neurology

## 2024-08-06 NOTE — Telephone Encounter (Signed)
 Pt's wife called stating that she has forms that need to be filled out for assistance on the pt's Eptinezumab -jjmr (VYEPTI  IV . She would like to know if the office already has these forms or is she needing to bring them so that he they can be filled out and authorized before his next inj is due.

## 2024-08-07 NOTE — Telephone Encounter (Signed)
 The form appears to be outdated (2024). Is there an updated version available on the website for 2025? If not, we will proceed with completing this form. Thank you Kyle [name]

## 2024-08-11 ENCOUNTER — Other Ambulatory Visit: Payer: Self-pay
